# Patient Record
Sex: Female | Born: 1940 | Race: White | Hispanic: No | Marital: Married | Smoking: Never smoker
Health system: Southern US, Community
[De-identification: ages and names within clinical notes are randomized; demographics above are authoritative.]

## PROBLEM LIST (undated history)

## (undated) DIAGNOSIS — E559 Vitamin D deficiency, unspecified: Secondary | ICD-10-CM

## (undated) DIAGNOSIS — C801 Malignant (primary) neoplasm, unspecified: Secondary | ICD-10-CM

## (undated) DIAGNOSIS — G709 Myoneural disorder, unspecified: Secondary | ICD-10-CM

## (undated) DIAGNOSIS — E7800 Pure hypercholesterolemia, unspecified: Secondary | ICD-10-CM

## (undated) DIAGNOSIS — K219 Gastro-esophageal reflux disease without esophagitis: Secondary | ICD-10-CM

## (undated) DIAGNOSIS — S42202A Unspecified fracture of upper end of left humerus, initial encounter for closed fracture: Secondary | ICD-10-CM

## (undated) DIAGNOSIS — E782 Mixed hyperlipidemia: Secondary | ICD-10-CM

## (undated) DIAGNOSIS — I10 Essential (primary) hypertension: Secondary | ICD-10-CM

## (undated) DIAGNOSIS — E118 Type 2 diabetes mellitus with unspecified complications: Secondary | ICD-10-CM

## (undated) DIAGNOSIS — M1990 Unspecified osteoarthritis, unspecified site: Secondary | ICD-10-CM

## (undated) DIAGNOSIS — J302 Other seasonal allergic rhinitis: Secondary | ICD-10-CM

## (undated) HISTORY — DX: Unspecified fracture of upper end of left humerus, initial encounter for closed fracture: S42.202A

## (undated) HISTORY — DX: Type 2 diabetes mellitus with unspecified complications: E11.8

## (undated) HISTORY — PX: COLONOSCOPY: SHX174

## (undated) HISTORY — PX: BACK SURGERY: SHX140

## (undated) HISTORY — PX: BREAST SURGERY: SHX581

## (undated) HISTORY — PX: ELBOW SURGERY: SHX618

## (undated) HISTORY — PX: LYMPH NODE BIOPSY: SHX201

## (undated) HISTORY — DX: Mixed hyperlipidemia: E78.2

## (undated) HISTORY — PX: OTHER SURGICAL HISTORY: SHX169

## (undated) HISTORY — PX: FRACTURE SURGERY: SHX138

## (undated) HISTORY — DX: Vitamin D deficiency, unspecified: E55.9

---

## 1974-03-27 HISTORY — PX: DILATION AND CURETTAGE OF UTERUS: SHX78

## 1989-03-27 HISTORY — PX: LUMBAR LAMINECTOMY: SHX95

## 1999-01-14 ENCOUNTER — Ambulatory Visit (HOSPITAL_COMMUNITY): Admission: RE | Admit: 1999-01-14 | Discharge: 1999-01-15 | Payer: Self-pay | Admitting: Surgery

## 1999-01-14 ENCOUNTER — Encounter: Payer: Self-pay | Admitting: Surgery

## 1999-02-02 ENCOUNTER — Encounter: Admission: RE | Admit: 1999-02-02 | Discharge: 1999-05-03 | Payer: Self-pay | Admitting: *Deleted

## 1999-02-16 ENCOUNTER — Ambulatory Visit (HOSPITAL_COMMUNITY): Admission: RE | Admit: 1999-02-16 | Discharge: 1999-02-16 | Payer: Self-pay | Admitting: *Deleted

## 1999-02-16 ENCOUNTER — Encounter: Payer: Self-pay | Admitting: *Deleted

## 1999-02-24 ENCOUNTER — Encounter: Payer: Self-pay | Admitting: *Deleted

## 1999-02-24 ENCOUNTER — Ambulatory Visit (HOSPITAL_COMMUNITY): Admission: RE | Admit: 1999-02-24 | Discharge: 1999-02-24 | Payer: Self-pay | Admitting: *Deleted

## 1999-06-07 ENCOUNTER — Encounter: Admission: RE | Admit: 1999-06-07 | Discharge: 1999-09-05 | Payer: Self-pay | Admitting: *Deleted

## 2000-08-07 ENCOUNTER — Encounter: Payer: Self-pay | Admitting: *Deleted

## 2000-08-07 ENCOUNTER — Encounter: Admission: RE | Admit: 2000-08-07 | Discharge: 2000-08-07 | Payer: Self-pay | Admitting: *Deleted

## 2003-09-21 ENCOUNTER — Emergency Department (HOSPITAL_COMMUNITY): Admission: EM | Admit: 2003-09-21 | Discharge: 2003-09-21 | Payer: Self-pay | Admitting: Family Medicine

## 2004-09-05 ENCOUNTER — Ambulatory Visit: Payer: Self-pay | Admitting: Oncology

## 2005-09-01 ENCOUNTER — Ambulatory Visit: Payer: Self-pay | Admitting: Oncology

## 2005-09-06 LAB — CBC WITH DIFFERENTIAL/PLATELET
BASO%: 1.2 % (ref 0.0–2.0)
Basophils Absolute: 0.1 10*3/uL (ref 0.0–0.1)
EOS%: 2.7 % (ref 0.0–7.0)
Eosinophils Absolute: 0.2 10*3/uL (ref 0.0–0.5)
HCT: 39.6 % (ref 34.8–46.6)
HGB: 13.6 g/dL (ref 11.6–15.9)
LYMPH%: 31 % (ref 14.0–48.0)
MCH: 29.5 pg (ref 26.0–34.0)
MCHC: 34.3 g/dL (ref 32.0–36.0)
MCV: 86.1 fL (ref 81.0–101.0)
MONO#: 0.4 10*3/uL (ref 0.1–0.9)
MONO%: 6.6 % (ref 0.0–13.0)
NEUT#: 4 10*3/uL (ref 1.5–6.5)
NEUT%: 58.6 % (ref 39.6–76.8)
Platelets: 140 10*3/uL — ABNORMAL LOW (ref 145–400)
RBC: 4.59 10*6/uL (ref 3.70–5.32)
RDW: 11.9 % (ref 11.3–14.5)
WBC: 6.8 10*3/uL (ref 3.9–10.0)
lymph#: 2.1 10*3/uL (ref 0.9–3.3)

## 2005-09-06 LAB — COMPREHENSIVE METABOLIC PANEL
ALT: 16 U/L (ref 0–40)
AST: 26 U/L (ref 0–37)
Albumin: 4.2 g/dL (ref 3.5–5.2)
Alkaline Phosphatase: 64 U/L (ref 39–117)
BUN: 11 mg/dL (ref 6–23)
CO2: 25 mEq/L (ref 19–32)
Calcium: 8.7 mg/dL (ref 8.4–10.5)
Chloride: 104 mEq/L (ref 96–112)
Creatinine, Ser: 0.69 mg/dL (ref 0.40–1.20)
Glucose, Bld: 115 mg/dL — ABNORMAL HIGH (ref 70–99)
Potassium: 3.8 mEq/L (ref 3.5–5.3)
Sodium: 137 mEq/L (ref 135–145)
Total Bilirubin: 0.4 mg/dL (ref 0.3–1.2)
Total Protein: 7 g/dL (ref 6.0–8.3)

## 2005-09-06 LAB — LACTATE DEHYDROGENASE: LDH: 241 U/L (ref 94–250)

## 2005-10-04 LAB — COMPREHENSIVE METABOLIC PANEL
ALT: 15 U/L (ref 0–40)
AST: 16 U/L (ref 0–37)
Albumin: 4.5 g/dL (ref 3.5–5.2)
Alkaline Phosphatase: 70 U/L (ref 39–117)
BUN: 11 mg/dL (ref 6–23)
CO2: 25 mEq/L (ref 19–32)
Calcium: 9 mg/dL (ref 8.4–10.5)
Chloride: 101 mEq/L (ref 96–112)
Creatinine, Ser: 0.61 mg/dL (ref 0.40–1.20)
Glucose, Bld: 85 mg/dL (ref 70–99)
Potassium: 4.1 mEq/L (ref 3.5–5.3)
Sodium: 138 mEq/L (ref 135–145)
Total Bilirubin: 0.3 mg/dL (ref 0.3–1.2)
Total Protein: 7.1 g/dL (ref 6.0–8.3)

## 2005-10-04 LAB — CBC WITH DIFFERENTIAL/PLATELET
BASO%: 0.6 % (ref 0.0–2.0)
Basophils Absolute: 0 10*3/uL (ref 0.0–0.1)
EOS%: 2.1 % (ref 0.0–7.0)
Eosinophils Absolute: 0.1 10*3/uL (ref 0.0–0.5)
HCT: 39.9 % (ref 34.8–46.6)
HGB: 13.4 g/dL (ref 11.6–15.9)
LYMPH%: 34.6 % (ref 14.0–48.0)
MCH: 29 pg (ref 26.0–34.0)
MCHC: 33.7 g/dL (ref 32.0–36.0)
MCV: 86 fL (ref 81.0–101.0)
MONO#: 0.4 10*3/uL (ref 0.1–0.9)
MONO%: 6.7 % (ref 0.0–13.0)
NEUT#: 3.3 10*3/uL (ref 1.5–6.5)
NEUT%: 56 % (ref 39.6–76.8)
Platelets: 236 10*3/uL (ref 145–400)
RBC: 4.63 10*6/uL (ref 3.70–5.32)
RDW: 13.7 % (ref 11.3–14.5)
WBC: 5.9 10*3/uL (ref 3.9–10.0)
lymph#: 2 10*3/uL (ref 0.9–3.3)

## 2005-10-04 LAB — LACTATE DEHYDROGENASE: LDH: 147 U/L (ref 94–250)

## 2006-08-27 ENCOUNTER — Ambulatory Visit: Payer: Self-pay | Admitting: Oncology

## 2006-08-29 LAB — CBC WITH DIFFERENTIAL/PLATELET
BASO%: 0.4 % (ref 0.0–2.0)
Basophils Absolute: 0 10*3/uL (ref 0.0–0.1)
EOS%: 2.5 % (ref 0.0–7.0)
Eosinophils Absolute: 0.2 10*3/uL (ref 0.0–0.5)
HCT: 36.8 % (ref 34.8–46.6)
HGB: 12.6 g/dL (ref 11.6–15.9)
LYMPH%: 27.1 % (ref 14.0–48.0)
MCH: 29.4 pg (ref 26.0–34.0)
MCHC: 34.3 g/dL (ref 32.0–36.0)
MCV: 85.7 fL (ref 81.0–101.0)
MONO#: 0.5 10*3/uL (ref 0.1–0.9)
MONO%: 6.1 % (ref 0.0–13.0)
NEUT#: 4.8 10*3/uL (ref 1.5–6.5)
NEUT%: 63.9 % (ref 39.6–76.8)
Platelets: 235 10*3/uL (ref 145–400)
RBC: 4.3 10*6/uL (ref 3.70–5.32)
RDW: 13.6 % (ref 11.3–14.5)
WBC: 7.5 10*3/uL (ref 3.9–10.0)
lymph#: 2 10*3/uL (ref 0.9–3.3)

## 2006-08-29 LAB — COMPREHENSIVE METABOLIC PANEL
ALT: 13 U/L (ref 0–35)
AST: 15 U/L (ref 0–37)
Albumin: 4.1 g/dL (ref 3.5–5.2)
Alkaline Phosphatase: 66 U/L (ref 39–117)
BUN: 12 mg/dL (ref 6–23)
CO2: 27 mEq/L (ref 19–32)
Calcium: 8.9 mg/dL (ref 8.4–10.5)
Chloride: 106 mEq/L (ref 96–112)
Creatinine, Ser: 0.69 mg/dL (ref 0.40–1.20)
Glucose, Bld: 128 mg/dL — ABNORMAL HIGH (ref 70–99)
Potassium: 3.2 mEq/L — ABNORMAL LOW (ref 3.5–5.3)
Sodium: 142 mEq/L (ref 135–145)
Total Bilirubin: 0.3 mg/dL (ref 0.3–1.2)
Total Protein: 6.8 g/dL (ref 6.0–8.3)

## 2006-08-29 LAB — LACTATE DEHYDROGENASE: LDH: 149 U/L (ref 94–250)

## 2007-09-26 ENCOUNTER — Ambulatory Visit: Payer: Self-pay | Admitting: Oncology

## 2007-10-01 LAB — CBC WITH DIFFERENTIAL/PLATELET
BASO%: 0.5 % (ref 0.0–2.0)
Basophils Absolute: 0 10*3/uL (ref 0.0–0.1)
EOS%: 3.4 % (ref 0.0–7.0)
Eosinophils Absolute: 0.2 10*3/uL (ref 0.0–0.5)
HCT: 40.1 % (ref 34.8–46.6)
HGB: 13.5 g/dL (ref 11.6–15.9)
LYMPH%: 30.6 % (ref 14.0–48.0)
MCH: 28.5 pg (ref 26.0–34.0)
MCHC: 33.6 g/dL (ref 32.0–36.0)
MCV: 84.9 fL (ref 81.0–101.0)
MONO#: 0.3 10*3/uL (ref 0.1–0.9)
MONO%: 5.9 % (ref 0.0–13.0)
NEUT#: 3.4 10*3/uL (ref 1.5–6.5)
NEUT%: 59.6 % (ref 39.6–76.8)
Platelets: 245 10*3/uL (ref 145–400)
RBC: 4.72 10*6/uL (ref 3.70–5.32)
RDW: 13.2 % (ref 11.3–14.5)
WBC: 5.7 10*3/uL (ref 3.9–10.0)
lymph#: 1.8 10*3/uL (ref 0.9–3.3)

## 2007-10-01 LAB — COMPREHENSIVE METABOLIC PANEL
ALT: 17 U/L (ref 0–35)
AST: 16 U/L (ref 0–37)
Albumin: 4.3 g/dL (ref 3.5–5.2)
Alkaline Phosphatase: 60 U/L (ref 39–117)
BUN: 13 mg/dL (ref 6–23)
CO2: 23 mEq/L (ref 19–32)
Calcium: 9 mg/dL (ref 8.4–10.5)
Chloride: 103 mEq/L (ref 96–112)
Creatinine, Ser: 0.7 mg/dL (ref 0.40–1.20)
Glucose, Bld: 103 mg/dL — ABNORMAL HIGH (ref 70–99)
Potassium: 3.8 mEq/L (ref 3.5–5.3)
Sodium: 138 mEq/L (ref 135–145)
Total Bilirubin: 0.4 mg/dL (ref 0.3–1.2)
Total Protein: 7.4 g/dL (ref 6.0–8.3)

## 2007-10-01 LAB — LACTATE DEHYDROGENASE: LDH: 157 U/L (ref 94–250)

## 2008-09-23 ENCOUNTER — Ambulatory Visit: Payer: Self-pay | Admitting: Oncology

## 2008-09-29 LAB — CBC WITH DIFFERENTIAL/PLATELET
BASO%: 0.6 % (ref 0.0–2.0)
Basophils Absolute: 0 10*3/uL (ref 0.0–0.1)
EOS%: 2.7 % (ref 0.0–7.0)
Eosinophils Absolute: 0.2 10*3/uL (ref 0.0–0.5)
HCT: 37.5 % (ref 34.8–46.6)
HGB: 13 g/dL (ref 11.6–15.9)
LYMPH%: 34.6 % (ref 14.0–49.7)
MCH: 29.7 pg (ref 25.1–34.0)
MCHC: 34.7 g/dL (ref 31.5–36.0)
MCV: 85.7 fL (ref 79.5–101.0)
MONO#: 0.4 10*3/uL (ref 0.1–0.9)
MONO%: 6.8 % (ref 0.0–14.0)
NEUT#: 3.6 10*3/uL (ref 1.5–6.5)
NEUT%: 55.3 % (ref 38.4–76.8)
Platelets: 217 10*3/uL (ref 145–400)
RBC: 4.37 10*6/uL (ref 3.70–5.45)
RDW: 13.8 % (ref 11.2–14.5)
WBC: 6.5 10*3/uL (ref 3.9–10.3)
lymph#: 2.2 10*3/uL (ref 0.9–3.3)

## 2008-09-29 LAB — COMPREHENSIVE METABOLIC PANEL
ALT: 21 U/L (ref 0–35)
AST: 24 U/L (ref 0–37)
Albumin: 4.2 g/dL (ref 3.5–5.2)
Alkaline Phosphatase: 73 U/L (ref 39–117)
BUN: 10 mg/dL (ref 6–23)
CO2: 24 mEq/L (ref 19–32)
Calcium: 9.1 mg/dL (ref 8.4–10.5)
Chloride: 103 mEq/L (ref 96–112)
Creatinine, Ser: 0.64 mg/dL (ref 0.40–1.20)
Glucose, Bld: 118 mg/dL — ABNORMAL HIGH (ref 70–99)
Potassium: 3.4 mEq/L — ABNORMAL LOW (ref 3.5–5.3)
Sodium: 140 mEq/L (ref 135–145)
Total Bilirubin: 0.2 mg/dL — ABNORMAL LOW (ref 0.3–1.2)
Total Protein: 7 g/dL (ref 6.0–8.3)

## 2008-09-29 LAB — LACTATE DEHYDROGENASE: LDH: 165 U/L (ref 94–250)

## 2008-11-20 LAB — HM PAP SMEAR

## 2008-11-20 LAB — HM MAMMOGRAPHY: HM Mammogram: NORMAL

## 2009-04-27 ENCOUNTER — Encounter: Admission: RE | Admit: 2009-04-27 | Discharge: 2009-04-27 | Payer: Self-pay | Admitting: Internal Medicine

## 2009-09-23 ENCOUNTER — Ambulatory Visit: Payer: Self-pay | Admitting: Oncology

## 2009-09-28 LAB — COMPREHENSIVE METABOLIC PANEL
ALT: 17 U/L (ref 0–35)
AST: 23 U/L (ref 0–37)
Albumin: 4.3 g/dL (ref 3.5–5.2)
Alkaline Phosphatase: 50 U/L (ref 39–117)
BUN: 11 mg/dL (ref 6–23)
CO2: 23 mEq/L (ref 19–32)
Calcium: 9.5 mg/dL (ref 8.4–10.5)
Chloride: 101 mEq/L (ref 96–112)
Creatinine, Ser: 0.6 mg/dL (ref 0.40–1.20)
Glucose, Bld: 93 mg/dL (ref 70–99)
Potassium: 3.6 mEq/L (ref 3.5–5.3)
Sodium: 136 mEq/L (ref 135–145)
Total Bilirubin: 0.3 mg/dL (ref 0.3–1.2)
Total Protein: 6.9 g/dL (ref 6.0–8.3)

## 2009-09-28 LAB — CBC WITH DIFFERENTIAL/PLATELET
BASO%: 0.3 % (ref 0.0–2.0)
Basophils Absolute: 0 10*3/uL (ref 0.0–0.1)
EOS%: 2.7 % (ref 0.0–7.0)
Eosinophils Absolute: 0.2 10*3/uL (ref 0.0–0.5)
HCT: 37.8 % (ref 34.8–46.6)
HGB: 12.5 g/dL (ref 11.6–15.9)
LYMPH%: 37.1 % (ref 14.0–49.7)
MCH: 28.7 pg (ref 25.1–34.0)
MCHC: 33.1 g/dL (ref 31.5–36.0)
MCV: 86.7 fL (ref 79.5–101.0)
MONO#: 0.5 10*3/uL (ref 0.1–0.9)
MONO%: 7.2 % (ref 0.0–14.0)
NEUT#: 3.5 10*3/uL (ref 1.5–6.5)
NEUT%: 52.7 % (ref 38.4–76.8)
Platelets: 218 10*3/uL (ref 145–400)
RBC: 4.36 10*6/uL (ref 3.70–5.45)
RDW: 13.4 % (ref 11.2–14.5)
WBC: 6.6 10*3/uL (ref 3.9–10.3)
lymph#: 2.4 10*3/uL (ref 0.9–3.3)

## 2009-09-28 LAB — LACTATE DEHYDROGENASE: LDH: 168 U/L (ref 94–250)

## 2010-12-26 HISTORY — PX: KNEE SURGERY: SHX244

## 2011-01-23 ENCOUNTER — Emergency Department (HOSPITAL_BASED_OUTPATIENT_CLINIC_OR_DEPARTMENT_OTHER)
Admission: EM | Admit: 2011-01-23 | Discharge: 2011-01-24 | Disposition: A | Discharge status: Home / Self Care | Payer: Medicare Other | Attending: Emergency Medicine | Admitting: Emergency Medicine

## 2011-01-23 ENCOUNTER — Emergency Department (INDEPENDENT_AMBULATORY_CARE_PROVIDER_SITE_OTHER): Payer: Medicare Other

## 2011-01-23 ENCOUNTER — Encounter: Payer: Self-pay | Admitting: *Deleted

## 2011-01-23 DIAGNOSIS — M5030 Other cervical disc degeneration, unspecified cervical region: Secondary | ICD-10-CM | POA: Insufficient documentation

## 2011-01-23 DIAGNOSIS — S52123A Displaced fracture of head of unspecified radius, initial encounter for closed fracture: Secondary | ICD-10-CM

## 2011-01-23 DIAGNOSIS — S0003XA Contusion of scalp, initial encounter: Secondary | ICD-10-CM | POA: Insufficient documentation

## 2011-01-23 DIAGNOSIS — Z043 Encounter for examination and observation following other accident: Secondary | ICD-10-CM

## 2011-01-23 DIAGNOSIS — Y92009 Unspecified place in unspecified non-institutional (private) residence as the place of occurrence of the external cause: Secondary | ICD-10-CM | POA: Insufficient documentation

## 2011-01-23 DIAGNOSIS — W19XXXA Unspecified fall, initial encounter: Secondary | ICD-10-CM | POA: Insufficient documentation

## 2011-01-23 DIAGNOSIS — E7800 Pure hypercholesterolemia, unspecified: Secondary | ICD-10-CM | POA: Insufficient documentation

## 2011-01-23 DIAGNOSIS — Z79899 Other long term (current) drug therapy: Secondary | ICD-10-CM | POA: Insufficient documentation

## 2011-01-23 DIAGNOSIS — Z8739 Personal history of other diseases of the musculoskeletal system and connective tissue: Secondary | ICD-10-CM | POA: Insufficient documentation

## 2011-01-23 DIAGNOSIS — M47812 Spondylosis without myelopathy or radiculopathy, cervical region: Secondary | ICD-10-CM

## 2011-01-23 DIAGNOSIS — S1093XA Contusion of unspecified part of neck, initial encounter: Secondary | ICD-10-CM | POA: Insufficient documentation

## 2011-01-23 DIAGNOSIS — S0990XA Unspecified injury of head, initial encounter: Secondary | ICD-10-CM

## 2011-01-23 HISTORY — DX: Pure hypercholesterolemia, unspecified: E78.00

## 2011-01-23 HISTORY — DX: Unspecified osteoarthritis, unspecified site: M19.90

## 2011-01-23 HISTORY — DX: Malignant (primary) neoplasm, unspecified: C80.1

## 2011-01-23 MED ORDER — HYDROCODONE-ACETAMINOPHEN 5-325 MG PO TABS
1.0000 | ORAL_TABLET | Freq: Once | ORAL | Status: AC
  Administered 2011-01-23: 1 via ORAL
  Filled 2011-01-23: qty 1

## 2011-01-23 MED ORDER — TETANUS-DIPHTH-ACELL PERTUSSIS 5-2.5-18.5 LF-MCG/0.5 IM SUSP
0.5000 mL | Freq: Once | INTRAMUSCULAR | Status: AC
  Administered 2011-01-23: 0.5 mL via INTRAMUSCULAR
  Filled 2011-01-23: qty 0.5

## 2011-01-23 NOTE — ED Provider Notes (Signed)
History    Scribed for Shelda Jakes, MD, the patient was seen in room MH05/MH05. This chart was scribed by Katha Cabal.   CSN: [id number] Arrival date & time: 01/23/2011  9:38 PM   First MD Initiated Contact with Patient 01/23/11 2156      Chief Complaint  Patient presents with  . Arm Injury  . Fall    (Consider location/radiation/quality/duration/timing/severity/associated sxs/prior treatment) HPI  History is provided by the patient and supplemented by patient's daughter.   Jennifer Wu is a 70 y.o. female brought in by ambulance, who presents to the Emergency Department complaining of sudden moderate fall with associated persistent constant left arm pain, head injury and facial abrasions. Denies chest pain, back pain, abdominal pain, neck pain, nausea, vomiting, and difficulty breathing. Pain is aggravated by movement.  Patient was knocked over by a gust of wind while trying to get leaves out of her pool.  Patient fell onto concrete landing injuring left arm and hitting head.  There was no LOC.  Patient felt and heard a pop in left forearm.  Patient had right meniscus repaired recently.  Patient used ice pack and EMS applied arm immobilizer prior to arrival. Tetanus is not UTD.    Past Medical History  Diagnosis Date  . High cholesterol   . Cancer   . Arthritis     Past Surgical History  Procedure Date  . Knee surgery   . Breast surgery   . Lymph node biopsy     No family history on file.  History  Substance Use Topics  . Smoking status: Never Smoker   . Smokeless tobacco: Not on file  . Alcohol Use: No    OB History    Grav Para Term Preterm Abortions TAB SAB Ect Mult Living                  Review of Systems 10 Systems reviewed and are negative for acute change except as noted in the HPI.  Allergies  Review of patient's allergies indicates no known allergies.  Home Medications   Current Outpatient Rx  Name Route Sig Dispense Refill  .  OMEPRAZOLE MAGNESIUM 20 MG PO TBEC Oral Take 20 mg by mouth daily.        BP 194/80  Pulse 89  Temp(Src) 98 F (36.7 C) (Oral)  Resp 20  Ht 5\' 6"  (1.676 m)  Wt 188 lb (85.276 kg)  BMI 30.34 kg/m2  SpO2 95%  Physical Exam  Nursing note and vitals reviewed. Constitutional: She is oriented to person, place, and time. She appears well-developed and well-nourished. No distress.  HENT:  Head: Normocephalic.       forehead hematoma medline, 2 cm abrasion of nose and superficial abrasion to chin   Eyes: EOM are normal. Pupils are equal, round, and reactive to light.  Neck: Normal range of motion. Neck supple. No tracheal deviation present.  Cardiovascular: Normal rate, regular rhythm and normal heart sounds.   No murmur heard. Pulmonary/Chest: Effort normal and breath sounds normal. No respiratory distress.  Abdominal: Soft. There is no tenderness.  Musculoskeletal: Normal range of motion. She exhibits tenderness. She exhibits no edema.       No UE deformity, Brisk cap refill, no shoulder or wrist tenderness, good radial pulse, proximal forearm tenderness, No LE edema or deformity,   Neurological: She is alert and oriented to person, place, and time. No sensory deficit.  Skin: Skin is warm and dry. No rash noted.  Psychiatric: She has a normal mood and affect. Her behavior is normal.    ED Course  Procedures (including critical care time)   DIAGNOSTIC STUDIES: Oxygen Saturation is 95% on room air, adequate by my interpretation.    COORDINATION OF CARE:  10:38 PM  Physical exam complete.  Will order Head, Maxillofacial, C spine CT and XR left forearm and elbow.   11:22 PM  Patient requests medication for pain.  Will order pain control.   11:36 PM  Reviewed radiological findings.  Fracture of the radial head.  Will order sling immobilizer and apply splint.  Plan to discharge patient.  Patient agrees with plan.      LABS / RADIOLOGY:   Labs Reviewed - No data to display  Dg  Elbow Complete Left  01/23/2011  *RADIOLOGY REPORT*  Clinical Data: Fall  LEFT ELBOW - COMPLETE 3+ VIEW  Comparison: None.  Findings: Fracture of the radial head with displacement of the fracture fragment.  Fracture extends into the elbow joint.  No other fracture is identified.  IMPRESSION: Displaced fracture of the radial head  Original Report Authenticated By: Camelia Phenes, M.D.   Dg Forearm Left  01/23/2011  *RADIOLOGY REPORT*  Clinical Data: Fall  LEFT FOREARM - 2 VIEW  Comparison: None.  Findings: Fracture of the radial head which is better evaluated on a separate elbow study.  No other fracture.  IMPRESSION: Fracture of the radial head.  Original Report Authenticated By: Camelia Phenes, M.D.   Ct Head Wo Contrast  01/23/2011  *RADIOLOGY REPORT*  Clinical Data:  Fall.  Head injury.  CT HEAD WITHOUT CONTRAST CT MAXILLOFACIAL WITHOUT CONTRAST CT CERVICAL SPINE WITHOUT CONTRAST  Technique:  Multidetector CT imaging of the head, cervical spine, and maxillofacial structures were performed using the standard protocol without intravenous contrast. Multiplanar CT image reconstructions of the cervical spine and maxillofacial structures were also generated.  Comparison:   none  CT HEAD  Findings: Ventricle size is normal.  Negative for hemorrhage. Negative for acute infarct or mass.  Negative for skull fracture.  IMPRESSION: Negative for acute abnormality.  CT MAXILLOFACIAL  Findings:  Negative for facial fracture.  Nasal bone is intact. Orbit is intact.  Negative for fracture of the mandible.  Mild chronic sinusitis.  Small frontal scalp hematoma to the left of midline just above the orbit.  No fluid in the sinuses.  IMPRESSION: Negative for facial fracture.  CT CERVICAL SPINE  Findings:   Negative for fracture.  Mild kyphosis of the cervical spine.  Mild anterior slip C3 on C4.  Disc degeneration and spondylosis C4-5, C5-6, C6-7.  Well circumscribed low density lesion in the C7 vertebral body anteriorly.   This appears benign and may represent hemangioma.  IMPRESSION: Negative for fracture.  Original Report Authenticated By: Camelia Phenes, M.D.   Ct Cervical Spine Wo Contrast  01/23/2011  *RADIOLOGY REPORT*  Clinical Data:  Fall.  Head injury.  CT HEAD WITHOUT CONTRAST CT MAXILLOFACIAL WITHOUT CONTRAST CT CERVICAL SPINE WITHOUT CONTRAST  Technique:  Multidetector CT imaging of the head, cervical spine, and maxillofacial structures were performed using the standard protocol without intravenous contrast. Multiplanar CT image reconstructions of the cervical spine and maxillofacial structures were also generated.  Comparison:   none  CT HEAD  Findings: Ventricle size is normal.  Negative for hemorrhage. Negative for acute infarct or mass.  Negative for skull fracture.  IMPRESSION: Negative for acute abnormality.  CT MAXILLOFACIAL  Findings:  Negative for facial fracture.  Nasal bone is intact. Orbit is intact.  Negative for fracture of the mandible.  Mild chronic sinusitis.  Small frontal scalp hematoma to the left of midline just above the orbit.  No fluid in the sinuses.  IMPRESSION: Negative for facial fracture.  CT CERVICAL SPINE  Findings:   Negative for fracture.  Mild kyphosis of the cervical spine.  Mild anterior slip C3 on C4.  Disc degeneration and spondylosis C4-5, C5-6, C6-7.  Well circumscribed low density lesion in the C7 vertebral body anteriorly.  This appears benign and may represent hemangioma.  IMPRESSION: Negative for fracture.  Original Report Authenticated By: Camelia Phenes, M.D.   Dg Hand Complete Left  01/23/2011  *RADIOLOGY REPORT*  Clinical Data: Fall  LEFT HAND - COMPLETE 3+ VIEW  Comparison: None.  Findings: Negative for fracture.  No significant arthropathy or bony lesion.  IMPRESSION: Negative  Original Report Authenticated By: Camelia Phenes, M.D.   Ct Maxillofacial Wo Cm  01/23/2011  *RADIOLOGY REPORT*  Clinical Data:  Fall.  Head injury.  CT HEAD WITHOUT CONTRAST CT  MAXILLOFACIAL WITHOUT CONTRAST CT CERVICAL SPINE WITHOUT CONTRAST  Technique:  Multidetector CT imaging of the head, cervical spine, and maxillofacial structures were performed using the standard protocol without intravenous contrast. Multiplanar CT image reconstructions of the cervical spine and maxillofacial structures were also generated.  Comparison:   none  CT HEAD  Findings: Ventricle size is normal.  Negative for hemorrhage. Negative for acute infarct or mass.  Negative for skull fracture.  IMPRESSION: Negative for acute abnormality.  CT MAXILLOFACIAL  Findings:  Negative for facial fracture.  Nasal bone is intact. Orbit is intact.  Negative for fracture of the mandible.  Mild chronic sinusitis.  Small frontal scalp hematoma to the left of midline just above the orbit.  No fluid in the sinuses.  IMPRESSION: Negative for facial fracture.  CT CERVICAL SPINE  Findings:   Negative for fracture.  Mild kyphosis of the cervical spine.  Mild anterior slip C3 on C4.  Disc degeneration and spondylosis C4-5, C5-6, C6-7.  Well circumscribed low density lesion in the C7 vertebral body anteriorly.  This appears benign and may represent hemangioma.  IMPRESSION: Negative for fracture.  Original Report Authenticated By: Camelia Phenes, M.D.         MDM   MDM:   Status post fall with head injury and left arm injury. CT and radiologic studies all negative except for left displaced radial head fracture. We'll treat with a sugar tong splint and referral to orthopedics. Patient is already followed by [facility].     MEDICATIONS GIVEN IN THE E.D. Scheduled Meds:    . HYDROcodone-acetaminophen  1 tablet Oral Once  . TDaP  0.5 mL Intramuscular Once   Continuous Infusions:      IMPRESSION: Radial head fracture    I personally performed the services described in this documentation, which was scribed in my presence. The recorded information has been reviewed and considered.               Shelda Jakes, MD 01/24/11 [phone]

## 2011-01-23 NOTE — ED Notes (Signed)
Pt has splint and ice applied pta

## 2011-01-23 NOTE — ED Notes (Signed)
Pt states wind "blew her down" while cleaning leaves out of pool- hit head with abrasion to nose and left arm pain- recent surgery on right knee-

## 2011-01-24 MED ORDER — HYDROCODONE-ACETAMINOPHEN 5-325 MG PO TABS: 1.0000 | ORAL_TABLET | Freq: Four times a day (QID) | ORAL | Status: AC | PRN

## 2011-01-26 HISTORY — PX: ELBOW SURGERY: SHX618

## 2011-01-31 ENCOUNTER — Other Ambulatory Visit: Payer: Self-pay

## 2011-01-31 ENCOUNTER — Inpatient Hospital Stay (HOSPITAL_BASED_OUTPATIENT_CLINIC_OR_DEPARTMENT_OTHER)
Admission: RE | Admit: 2011-01-31 | Discharge: 2011-01-31 | Disposition: A | Discharge status: Home / Self Care | Payer: BC Managed Care – PPO | Source: Ambulatory Visit

## 2011-01-31 ENCOUNTER — Encounter (HOSPITAL_BASED_OUTPATIENT_CLINIC_OR_DEPARTMENT_OTHER): Payer: Self-pay | Admitting: *Deleted

## 2011-01-31 NOTE — H&P (Signed)
  See dictated note [id number] 

## 2011-02-01 ENCOUNTER — Encounter (HOSPITAL_BASED_OUTPATIENT_CLINIC_OR_DEPARTMENT_OTHER): Payer: Self-pay | Admitting: Anesthesiology

## 2011-02-01 ENCOUNTER — Ambulatory Visit (HOSPITAL_BASED_OUTPATIENT_CLINIC_OR_DEPARTMENT_OTHER): Payer: Medicare Other | Admitting: Anesthesiology

## 2011-02-01 ENCOUNTER — Encounter (HOSPITAL_BASED_OUTPATIENT_CLINIC_OR_DEPARTMENT_OTHER): Payer: Self-pay | Admitting: *Deleted

## 2011-02-01 ENCOUNTER — Other Ambulatory Visit: Payer: Self-pay | Admitting: Orthopedic Surgery

## 2011-02-01 ENCOUNTER — Encounter (HOSPITAL_BASED_OUTPATIENT_CLINIC_OR_DEPARTMENT_OTHER)
Admission: RE | Disposition: A | Discharge status: Home / Self Care | Payer: Self-pay | Source: Ambulatory Visit | Attending: Orthopedic Surgery

## 2011-02-01 ENCOUNTER — Ambulatory Visit (HOSPITAL_BASED_OUTPATIENT_CLINIC_OR_DEPARTMENT_OTHER)
Admission: RE | Admit: 2011-02-01 | Discharge: 2011-02-01 | Disposition: A | Discharge status: Home / Self Care | Payer: Medicare Other | Source: Ambulatory Visit | Attending: Orthopedic Surgery | Admitting: Orthopedic Surgery

## 2011-02-01 DIAGNOSIS — S52123A Displaced fracture of head of unspecified radius, initial encounter for closed fracture: Secondary | ICD-10-CM | POA: Insufficient documentation

## 2011-02-01 DIAGNOSIS — Z01812 Encounter for preprocedural laboratory examination: Secondary | ICD-10-CM | POA: Insufficient documentation

## 2011-02-01 DIAGNOSIS — Z01810 Encounter for preprocedural cardiovascular examination: Secondary | ICD-10-CM | POA: Insufficient documentation

## 2011-02-01 DIAGNOSIS — G709 Myoneural disorder, unspecified: Secondary | ICD-10-CM | POA: Insufficient documentation

## 2011-02-01 DIAGNOSIS — X58XXXA Exposure to other specified factors, initial encounter: Secondary | ICD-10-CM | POA: Insufficient documentation

## 2011-02-01 DIAGNOSIS — E669 Obesity, unspecified: Secondary | ICD-10-CM | POA: Insufficient documentation

## 2011-02-01 DIAGNOSIS — S5290XA Unspecified fracture of unspecified forearm, initial encounter for closed fracture: Secondary | ICD-10-CM

## 2011-02-01 HISTORY — DX: Myoneural disorder, unspecified: G70.9

## 2011-02-01 HISTORY — PX: RADIAL HEAD IMPLANT: SHX758

## 2011-02-01 LAB — POCT HEMOGLOBIN-HEMACUE: Hemoglobin: 12.5 g/dL (ref 12.0–15.0)

## 2011-02-01 SURGERY — ARTHROPLASTY, RADIUS, HEAD
Anesthesia: General | Laterality: Left

## 2011-02-01 MED ORDER — TETRACAINE HCL 1 % IJ SOLN
INTRAMUSCULAR | Status: DC | PRN
  Administered 2011-02-01: 20 mg via INTRASPINAL

## 2011-02-01 MED ORDER — ONDANSETRON HCL 4 MG/2ML IJ SOLN
INTRAMUSCULAR | Status: DC | PRN
  Administered 2011-02-01: 4 mg via INTRAVENOUS

## 2011-02-01 MED ORDER — ACETAMINOPHEN 10 MG/ML IV SOLN
1000.0000 mg | Freq: Once | INTRAVENOUS | Status: AC
  Administered 2011-02-01: 1000 mg via INTRAVENOUS

## 2011-02-01 MED ORDER — HYDROMORPHONE HCL PF 1 MG/ML IJ SOLN: 0.2500 mg | INTRAMUSCULAR | Status: DC | PRN

## 2011-02-01 MED ORDER — EPHEDRINE SULFATE 50 MG/ML IJ SOLN
INTRAMUSCULAR | Status: DC | PRN
  Administered 2011-02-01: 10 mg via INTRAVENOUS
  Administered 2011-02-01: 5 mg via INTRAVENOUS

## 2011-02-01 MED ORDER — DEXAMETHASONE SODIUM PHOSPHATE 4 MG/ML IJ SOLN
INTRAMUSCULAR | Status: DC | PRN
  Administered 2011-02-01: 10 mg via INTRAVENOUS

## 2011-02-01 MED ORDER — CEFAZOLIN SODIUM 1-5 GM-% IV SOLN: 1.0000 g | INTRAVENOUS | Status: DC

## 2011-02-01 MED ORDER — MEPERIDINE HCL 25 MG/ML IJ SOLN: 6.2500 mg | INTRAMUSCULAR | Status: DC | PRN

## 2011-02-01 MED ORDER — PROPOFOL 10 MG/ML IV EMUL
INTRAVENOUS | Status: DC | PRN
  Administered 2011-02-01: 200 mg via INTRAVENOUS

## 2011-02-01 MED ORDER — PROMETHAZINE HCL 25 MG/ML IJ SOLN: 6.2500 mg | INTRAMUSCULAR | Status: DC | PRN

## 2011-02-01 MED ORDER — MIDAZOLAM HCL 2 MG/2ML IJ SOLN: 1.0000 mg | INTRAMUSCULAR | Status: DC | PRN

## 2011-02-01 MED ORDER — CEFAZOLIN SODIUM-DEXTROSE 2-3 GM-% IV SOLR
2.0000 g | INTRAVENOUS | Status: AC
  Administered 2011-02-01: 2 g via INTRAVENOUS

## 2011-02-01 MED ORDER — LACTATED RINGERS IV SOLN
INTRAVENOUS | Status: DC
  Administered 2011-02-01 (×2): via INTRAVENOUS

## 2011-02-01 MED ORDER — LIDOCAINE HCL (CARDIAC) 20 MG/ML IV SOLN
INTRAVENOUS | Status: DC | PRN
  Administered 2011-02-01: 75 mg via INTRAVENOUS

## 2011-02-01 MED ORDER — PHENYLEPHRINE HCL 10 MG/ML IJ SOLN
INTRAMUSCULAR | Status: DC | PRN
  Administered 2011-02-01: 40 ug via INTRAVENOUS
  Administered 2011-02-01: 60 ug via INTRAVENOUS
  Administered 2011-02-01: 40 ug via INTRAVENOUS

## 2011-02-01 MED ORDER — LIDOCAINE-EPINEPHRINE 1.5-1:200000 % IJ SOLN
INTRAMUSCULAR | Status: DC | PRN
  Administered 2011-02-01: 50 mg via INTRADERMAL

## 2011-02-01 MED ORDER — MIDAZOLAM HCL 2 MG/2ML IJ SOLN
1.0000 mg | INTRAMUSCULAR | Status: DC | PRN
  Administered 2011-02-01: 2 mg via INTRAVENOUS

## 2011-02-01 MED ORDER — FENTANYL CITRATE 0.05 MG/ML IJ SOLN
50.0000 ug | INTRAMUSCULAR | Status: DC | PRN
  Administered 2011-02-01: 100 ug via INTRAVENOUS

## 2011-02-01 SURGICAL SUPPLY — 74 items
BAG DECANTER FOR FLEXI CONT (MISCELLANEOUS) IMPLANT
BANDAGE ELASTIC 3 VELCRO ST LF (GAUZE/BANDAGES/DRESSINGS) ×2 IMPLANT
BANDAGE ELASTIC 4 VELCRO ST LF (GAUZE/BANDAGES/DRESSINGS) ×4 IMPLANT
BANDAGE GAUZE ELAST BULKY 4 IN (GAUZE/BANDAGES/DRESSINGS) IMPLANT
BLADE AVERAGE 25X9 (BLADE) ×2 IMPLANT
BLADE MINI RND TIP GREEN BEAV (BLADE) IMPLANT
BLADE SURG 15 STRL LF DISP TIS (BLADE) ×1 IMPLANT
BLADE SURG 15 STRL SS (BLADE) ×2
BNDG CMPR 9X4 STRL LF SNTH (GAUZE/BANDAGES/DRESSINGS) ×1
BNDG ESMARK 4X9 LF (GAUZE/BANDAGES/DRESSINGS) ×2 IMPLANT
CANISTER SUCTION 1200CC (MISCELLANEOUS) ×2 IMPLANT
CLOSURE STERI STRIP 1/2 X4 (GAUZE/BANDAGES/DRESSINGS) ×2 IMPLANT
CLOTH BEACON ORANGE TIMEOUT ST (SAFETY) ×2 IMPLANT
CORDS BIPOLAR (ELECTRODE) ×2 IMPLANT
COVER TABLE BACK 60X90 (DRAPES) ×2 IMPLANT
CUFF TOURNIQUET SINGLE 18IN (TOURNIQUET CUFF) ×2 IMPLANT
DECANTER SPIKE VIAL GLASS SM (MISCELLANEOUS) IMPLANT
DRAPE EXTREMITY T 121X128X90 (DRAPE) ×2 IMPLANT
DRAPE OEC MINIVIEW 54X84 (DRAPES) ×2 IMPLANT
DRAPE SURG 17X23 STRL (DRAPES) ×2 IMPLANT
DRSG KUZMA FLUFF (GAUZE/BANDAGES/DRESSINGS) IMPLANT
DURAPREP 26ML APPLICATOR (WOUND CARE) ×2 IMPLANT
ELECT REM PT RETURN 9FT ADLT (ELECTROSURGICAL)
ELECTRODE REM PT RTRN 9FT ADLT (ELECTROSURGICAL) IMPLANT
GAUZE XEROFORM 1X8 LF (GAUZE/BANDAGES/DRESSINGS) ×2 IMPLANT
GLOVE BIO SURGEON STRL SZ 6.5 (GLOVE) ×2 IMPLANT
GLOVE BIO SURGEON STRL SZ8 (GLOVE) ×2 IMPLANT
GLOVE BIO SURGEON STRL SZ8.5 (GLOVE) ×2 IMPLANT
GLOVE BIOGEL PI IND STRL 8.5 (GLOVE) IMPLANT
GLOVE BIOGEL PI INDICATOR 8.5 (GLOVE)
GLOVE INDICATOR 7.0 STRL GRN (GLOVE) ×2 IMPLANT
GLOVE SURG ORTHO 8.0 STRL STRW (GLOVE) IMPLANT
GOWN PREVENTION PLUS XLARGE (GOWN DISPOSABLE) ×4 IMPLANT
GOWN PREVENTION PLUS XXLARGE (GOWN DISPOSABLE) ×4 IMPLANT
HEAD RADIAL ALIGN 20MM (Head) ×2 IMPLANT
NEEDLE 1/2 CIR CATGUT .05X1.09 (NEEDLE) IMPLANT
NEEDLE HYPO 25X1 1.5 SAFETY (NEEDLE) IMPLANT
NS IRRIG 1000ML POUR BTL (IV SOLUTION) ×2 IMPLANT
PACK BASIN DAY SURGERY FS (CUSTOM PROCEDURE TRAY) ×2 IMPLANT
PAD CAST 3X4 CTTN HI CHSV (CAST SUPPLIES) IMPLANT
PAD CAST 4YDX4 CTTN HI CHSV (CAST SUPPLIES) IMPLANT
PADDING CAST ABS 4INX4YD NS (CAST SUPPLIES) ×1
PADDING CAST ABS COTTON 4X4 ST (CAST SUPPLIES) ×1 IMPLANT
PADDING CAST COTTON 3X4 STRL (CAST SUPPLIES)
PADDING CAST COTTON 4X4 STRL (CAST SUPPLIES)
PADDING UNDERCAST 2  STERILE (CAST SUPPLIES) IMPLANT
PADDING WEBRIL 3 STERILE (GAUZE/BANDAGES/DRESSINGS) ×2 IMPLANT
PENCIL BUTTON HOLSTER BLD 10FT (ELECTRODE) IMPLANT
SHEET MEDIUM DRAPE 40X70 STRL (DRAPES) ×4 IMPLANT
SPLINT FAST PLASTER 5X30 (CAST SUPPLIES) ×10
SPLINT PLASTER 5X30 (CAST SUPPLIES) ×2 IMPLANT
SPLINT PLASTER CAST FAST 5X30 (CAST SUPPLIES) ×10 IMPLANT
SPONGE GAUZE 4X4 12PLY (GAUZE/BANDAGES/DRESSINGS) ×2 IMPLANT
SPONGE GAUZE 4X4 FOR O.R. (GAUZE/BANDAGES/DRESSINGS) ×2 IMPLANT
STAPLER VISISTAT (STAPLE) IMPLANT
STOCKINETTE 4X48 STRL (DRAPES) ×2 IMPLANT
SUCTION FRAZIER TIP 10 FR DISP (SUCTIONS) ×2 IMPLANT
SUT ETHILON 4 0 PS 2 18 (SUTURE) IMPLANT
SUT MERSILENE 4 0 P 3 (SUTURE) IMPLANT
SUT PROLENE 3 0 PS 2 (SUTURE) ×2 IMPLANT
SUT SILK 2 0 FS (SUTURE) IMPLANT
SUT VIC AB 0 CT1 27 (SUTURE) ×2
SUT VIC AB 0 CT1 27XBRD ANBCTR (SUTURE) ×1 IMPLANT
SUT VIC AB 2-0 SH 27 (SUTURE) ×2
SUT VIC AB 2-0 SH 27XBRD (SUTURE) ×1 IMPLANT
SUT VIC AB 3-0 FS2 27 (SUTURE) IMPLANT
SUT VICRYL RAPIDE 4/0 PS 2 (SUTURE) IMPLANT
SYR BULB 3OZ (MISCELLANEOUS) ×2 IMPLANT
SYRINGE 10CC LL (SYRINGE) ×2 IMPLANT
TOWEL OR 17X24 6PK STRL BLUE (TOWEL DISPOSABLE) ×4 IMPLANT
TUBE CONNECTING 20X1/4 (TUBING) ×2 IMPLANT
UNDERPAD 30X30 INCONTINENT (UNDERPADS AND DIAPERS) ×2 IMPLANT
WATER STERILE IRR 1000ML POUR (IV SOLUTION) ×2 IMPLANT
align radial head and lock screw (Screw) ×2 IMPLANT

## 2011-02-01 NOTE — Anesthesia Postprocedure Evaluation (Signed)
  Anesthesia Post-op Note  Patient: Jennifer Wu  Procedure(s) Performed:  RADIAL HEAD IMPLANT - left radial head replacement  Patient Location: PACU  Anesthesia Type: GA combined with regional for post-op pain  Level of Consciousness: awake, alert  and oriented  Airway and Oxygen Therapy: Patient Spontanous Breathing  Post-op Pain: none  Post-op Assessment: Post-op Vital signs reviewed, Patient's Cardiovascular Status Stable, Respiratory Function Stable, Patent Airway, No signs of Nausea or vomiting, Adequate PO intake and Pain level controlled  Post-op Vital Signs: stable  Complications: No apparent anesthesia complications

## 2011-02-01 NOTE — Op Note (Signed)
NAME:  Jennifer Wu, Jennifer Wu                    ACCOUNT NO.:  MEDICAL RECORD NO.:  [id number]  LOCATION:                                 FACILITY:  PHYSICIAN:  Artist Pais. Kynzlie Hilleary, M.D.DATE OF BIRTH:  06-24-1940  DATE OF PROCEDURE:  02/01/2011 DATE OF DISCHARGE:                              OPERATIVE REPORT   PREOPERATIVE DIAGNOSIS:  Comminuted left radial head fracture.  POSTOPERATIVE DIAGNOSIS:  Comminuted left radial head fracture.  PROCEDURE:  Radial head replacement using Align prosthetic radial head #7 stem, 20 head, +2 neck.  SURGEON:  Artist Pais. Mina Marble, MD and Dr. Merlyn Lot.  ANESTHESIA:  General and axillary block.  TOURNIQUET TIME:  1 hour and 6 minutes.  COMPLICATIONS:  No complications.  DRAINS:  No drains.  The patient was taken to the operating suite.  After induction of adequate axillary block analgesia and then a general anesthetic laryngeal mask airway, left upper extremity was prepped and draped in usual sterile fashion.  An Esmarch was used to exsanguinate the limb. Tourniquet was then inflated to 265 mmHg.  At this point in time, a standard Kocher approach to the lateral aspect of the elbow was undertaken.  Skin was incised.  The palpable radial capitellar joint was detected underlying the extensor musculature.  Muscle split was undertaken.  The capsule overlying the radial capitellar joint was carefully incised revealing the fracture site.  The radial nerve was carefully identified and retracted in the anterior aspect of the wound. Once this was done, visualization revealed a comminuted 3-4 part radial head fracture, the radial head fragments were carefully excised, placed on the back table for sizing of the radial head prosthetic replacement. This was deemed to be #20 size head.  Once this was done, a complete synovectomy was performed.  Sagittal saw was then used to make a neck cut until the adequate length had been established.  Once this was done, the  intramedullary canal was reamed using the reamers on the Align radial head set as well as a curved curettes, #7 stem and the #8 stem were trialed as well as a 20 mm head, +2 neck all deemed to be in appropriate position clinically and radiographically.  Once this thumb was thoroughly irrigated, the stem was placed.  Using the external alignment guide, anatomic alignment was achieved and the modular head and neck were then placed and locked using the torque wrench. Intraoperative fluoroscopy then revealed adequate reduction in the AP and lateral plane with good motion of both flexion, extension, pronation, and supination.  Stability was achieved.  The wound was then thoroughly irrigated.  The capsular layers were closed with 0-Vicryl suture followed by 2-0 undyed Vicryl on the subcutaneous sutures and 3-0 Prolene subcuticular stitch on the skin.  Steri-Strips, 4x4s, fluffs, and a compressive dressing was applied as well as a sugar-tong splint with the elbow flexed in 90 degrees and forearm in neutral.  The patient tolerated the procedure well and went to the recovery room in a stable fashion.     Artist Pais Mina Marble, M.D.     MAW/MEDQ  D:  02/01/2011  T:  02/01/2011  Job:  [id number]

## 2011-02-01 NOTE — H&P (Signed)
  See dictated note [id number] 

## 2011-02-01 NOTE — Transfer of Care (Signed)
Immediate Anesthesia Transfer of Care Note  Patient: Jennifer Wu  Procedure(s) Performed:  RADIAL HEAD IMPLANT - left radial head replacement  Patient Location: PACU  Anesthesia Type: General and Regional  Level of Consciousness: awake  Airway & Oxygen Therapy: Patient Spontanous Breathing and Patient connected to face mask oxygen  Post-op Assessment: Report given to PACU RN and Post -op Vital signs reviewed and stable  Post vital signs: Reviewed and stable  Complications: No apparent anesthesia complications

## 2011-02-01 NOTE — H&P (View-Only) (Signed)
  See dictated note [phone]

## 2011-02-01 NOTE — Anesthesia Preprocedure Evaluation (Signed)
Anesthesia Evaluation  Patient identified by MRN, date of birth, ID band Patient awake    Reviewed: Allergy & Precautions, H&P , NPO status , Patient's Chart, lab work & pertinent test results  Airway Mallampati: II TM Distance: >3 FB Neck ROM: Full    Dental  (+) Poor Dentition   Pulmonary  clear to auscultation        Cardiovascular Regular Normal    Neuro/Psych  Neuromuscular disease    GI/Hepatic   Endo/Other    Renal/GU      Musculoskeletal   Abdominal (+) obese,   Peds  Hematology   Anesthesia Other Findings   Reproductive/Obstetrics                           Anesthesia Physical Anesthesia Plan  ASA: II  Anesthesia Plan: General   Post-op Pain Management:    Induction: Intravenous  Airway Management Planned: LMA  Additional Equipment:   Intra-op Plan:   Post-operative Plan: Extubation in OR  Informed Consent: I have reviewed the patients History and Physical, chart, labs and discussed the procedure including the risks, benefits and alternatives for the proposed anesthesia with the patient or authorized representative who has indicated his/her understanding and acceptance.     Plan Discussed with:   Anesthesia Plan Comments:         Anesthesia Quick Evaluation

## 2011-02-01 NOTE — H&P (Signed)
NAME:  Jennifer Wu, Jennifer Wu                    ACCOUNT NO.:  MEDICAL RECORD NO.:  [id number]  LOCATION:                                 FACILITY:  PHYSICIAN:  Artist Pais. Keonta Alsip, M.D.DATE OF BIRTH:  April 14, 1940  DATE OF ADMISSION: DATE OF DISCHARGE:                             HISTORY & PHYSICAL   HISTORY OF PRESENT ILLNESS:  Jennifer Wu is a 70 year old female, who is 608 pounds who fell on January 23, 2011, referred here by Dr. Natalia Leatherwood for a radial head fracture, nondominant left elbow.  She fell in the backyard on concrete around the floor on January 23, 2011.  The pain is constant in nature, throbbing-ache type pain associated with swelling. She denies any extremity issues in the past.  She has no known drug allergies.  She is not diabetic.  REVIEW OF SYSTEM:  Significant for cancer in 2000.  Reading glasses for corrective vision.  Breast cancer.  All systems are reviewed and noncontributory.  MEDICATIONS:  She is currently taking Prilosec.  No other medications were listed.  PAST SURGICAL HISTORY:  Status post back surgery, cancer surgery, colonoscopy, and meniscal repair in the past.  No recent hospitalizations or surgery.  FAMILY HISTORY:  Noncontributory except for diabetes, heart disease, hypertension and arthritis.  SOCIAL HISTORY:  Noncontributory.  She is retired.  EXAMINATION:  GENERAL: A well-nourished female, alert and oriented x3. MUSCULOSKELETAL:  She has normal gait.  She has good range of motion in the left shoulder.  She is in a posterior elbow splint.  Neurosensory exam is intact.  No significant digital swelling.  X-RAYS:  Show a comminuted radial head fracture on the left side with multiple fragments.  IMPRESSION:  This is a 71 year old female with a fracture of the radial head.  Nondominant left elbow, comminuted.  We discussed radial head replacement.  We will do this tomorrow as an elective patient at [facility].     Artist Pais  Mina Marble, M.D.     MAW/MEDQ  D:  01/31/2011  T:  01/31/2011  Job:  [id number]

## 2011-02-01 NOTE — Anesthesia Procedure Notes (Addendum)
Anesthesia Regional Block:  Axillary brachial plexus block  Pre-Anesthetic Checklist: ,, timeout performed, Correct Patient, Correct Site, Correct Procedure, Correct Position, site marked, risks and benefits discussed, surgical consent, pre-op evaluation,  At surgeon's request and post-op pain management  Laterality: Left  Prep: chloraprep       Needles:  Injection technique: Single-shot  Needle Type: Other   (22g short bevel)    Needle Gauge: 22 and 22 G    Additional Needles:  Procedures: transarterial technique Axillary brachial plexus block Narrative:  Start time: 02/01/2011 11:10 AM End time: 02/01/2011 11:18 AM  Performed by: Personally    Performed by: Gladys Damme    Procedure Name: LMA Insertion Date/Time: 02/01/2011 11:36 AM Performed by: Gladys Damme Pre-anesthesia Checklist: Patient identified, Emergency Drugs available, Suction available, Patient being monitored and Timeout performed Patient Re-evaluated:Patient Re-evaluated prior to inductionOxygen Delivery Method: Circle System Utilized Preoxygenation: Pre-oxygenation with 100% oxygen Intubation Type: IV induction Ventilation: Mask ventilation without difficulty and Oral airway inserted - appropriate to patient size LMA: LMA with gastric port inserted LMA Size: 4.0 Placement Confirmation: positive ETCO2 and breath sounds checked- equal and bilateral Dental Injury: Teeth and Oropharynx as per pre-operative assessment     Anesthesia Regional Block:   Narrative:

## 2011-02-01 NOTE — Interval H&P Note (Signed)
History and Physical Interval Note:   02/01/2011   10:31 AM   Jennifer Wu  has presented today for surgery, with the diagnosis of left radial head fracture  The various methods of treatment have been discussed with the patient and family. After consideration of risks, benefits and other options for treatment, the patient has consented to  Procedure(s): RADIAL HEAD IMPLANT as a surgical intervention .  The patients' history has been reviewed, patient examined, no change in status, stable for surgery.  I have reviewed the patients' chart and labs.  Questions were answered to the patient's satisfaction.     Marlowe Shores  MD

## 2011-02-01 NOTE — Interval H&P Note (Signed)
History and Physical Interval Note:   02/01/2011   10:33 AM   Jennifer Wu  has presented today for surgery, with the diagnosis of left radial head fracture  The various methods of treatment have been discussed with the patient and family. After consideration of risks, benefits and other options for treatment, the patient has consented to  Procedure(s): RADIAL HEAD IMPLANT as a surgical intervention .  The patients' history has been reviewed, patient examined, no change in status, stable for surgery.  I have reviewed the patients' chart and labs.  Questions were answered to the patient's satisfaction.     Marlowe Shores  MD

## 2011-02-01 NOTE — Brief Op Note (Signed)
02/01/2011  1:27 PM  PATIENT:  Rolena Infante  70 y.o. female  PRE-OPERATIVE DIAGNOSIS:  left radial head fracture  POST-OPERATIVE DIAGNOSIS:  same   PROCEDURE:  Procedure(s): RADIAL HEAD IMPLANT  SURGEON:  Surgeon(s): Marlowe Shores, MD Nicki Reaper, MD  PHYSICIAN ASSISTANT:   ASSISTANTSMerlyn Lot   ANESTHESIA:   general  EBL:  Total I/O In: 1600 [I.V.:1600] Out: -   BLOOD ADMINISTERED:none  DRAINS: none   LOCAL MEDICATIONS USED:  NONE  SPECIMEN:  No Specimen  DISPOSITION OF SPECIMEN:  N/A  COUNTS:  YES  TOURNIQUET:   Total Tourniquet Time Documented: Upper Arm (Left) - 90 minutes  DICTATION: .Other Dictation: Dictation Number [id number]  PLAN OF CARE: Discharge to home after PACU  PATIENT DISPOSITION:  PACU - hemodynamically stable.   Delay start of Pharmacological VTE agent (>24hrs) due to surgical blood loss or risk of bleeding:  not applicable

## 2011-02-02 ENCOUNTER — Other Ambulatory Visit: Payer: Self-pay | Admitting: Orthopedic Surgery

## 2011-02-06 ENCOUNTER — Encounter (HOSPITAL_BASED_OUTPATIENT_CLINIC_OR_DEPARTMENT_OTHER): Payer: Self-pay | Admitting: Orthopedic Surgery

## 2011-05-26 ENCOUNTER — Encounter (HOSPITAL_COMMUNITY): Payer: Self-pay

## 2011-05-30 ENCOUNTER — Encounter (HOSPITAL_COMMUNITY): Payer: Self-pay

## 2011-05-30 ENCOUNTER — Encounter (HOSPITAL_COMMUNITY)
Admission: RE | Admit: 2011-05-30 | Discharge: 2011-05-30 | Disposition: A | Discharge status: Home / Self Care | Payer: Medicare Other | Source: Ambulatory Visit | Attending: Obstetrics and Gynecology | Admitting: Obstetrics and Gynecology

## 2011-05-30 HISTORY — DX: Other seasonal allergic rhinitis: J30.2

## 2011-05-30 HISTORY — DX: Gastro-esophageal reflux disease without esophagitis: K21.9

## 2011-05-30 LAB — CBC
HCT: 38.8 % (ref 36.0–46.0)
Hemoglobin: 12.2 g/dL (ref 12.0–15.0)
MCH: 27.9 pg (ref 26.0–34.0)
MCHC: 31.4 g/dL (ref 30.0–36.0)
MCV: 88.6 fL (ref 78.0–100.0)
Platelets: 261 10*3/uL (ref 150–400)
RBC: 4.38 MIL/uL (ref 3.87–5.11)
RDW: 13.9 % (ref 11.5–15.5)
WBC: 5.8 10*3/uL (ref 4.0–10.5)

## 2011-05-30 LAB — COMPREHENSIVE METABOLIC PANEL WITH GFR
ALT: 16 U/L (ref 0–35)
AST: 20 U/L (ref 0–37)
Albumin: 3.8 g/dL (ref 3.5–5.2)
Alkaline Phosphatase: 49 U/L (ref 39–117)
BUN: 15 mg/dL (ref 6–23)
CO2: 30 meq/L (ref 19–32)
Calcium: 9.6 mg/dL (ref 8.4–10.5)
Chloride: 101 meq/L (ref 96–112)
Creatinine, Ser: 0.58 mg/dL (ref 0.50–1.10)
GFR calc Af Amer: >90 mL/min
GFR calc non Af Amer: >90 mL/min
Glucose, Bld: 94 mg/dL (ref 70–99)
Potassium: 3.1 meq/L — ABNORMAL LOW (ref 3.5–5.1)
Sodium: 139 meq/L (ref 135–145)
Total Bilirubin: 0.2 mg/dL — ABNORMAL LOW (ref 0.3–1.2)
Total Protein: 6.8 g/dL (ref 6.0–8.3)

## 2011-05-30 NOTE — Patient Instructions (Addendum)
   Your procedure is scheduled on:  Friday, March 15th  Enter through the Main Entrance of [facility] at:  [employer] up the phone at the desk and dial [phone] and inform us of your arrival.  Please call this number if you have any problems the morning of surgery: [phone]  Remember: Do not eat food after midnight: Thursday Do not drink clear liquids after: Thursday Take these medicines the morning of surgery with a SIP OF WATER: prilosec  Do not wear jewelry, make-up, or FINGER nail polish Do not wear lotions, powders, perfumes or deodorant. Do not shave 48 hours prior to surgery. Do not bring valuables to the hospital. Contacts, dentures or bridgework may not be worn into surgery.  Leave suitcase in the car. After Surgery it may be brought to your room. For patients being admitted to the hospital, checkout time is 11:00am the day of discharge.    Patients discharged on the day of surgery will not be allowed to drive home.  Home with Home with husband Link Snuffer  cell [phone]   Remember to use your hibiclens as instructed.Please shower with 1/2 bottle the evening before your surgery and the other 1/2 bottle the morning of surgery. Neck down avoiding private area.

## 2011-06-08 NOTE — H&P (Signed)
Jennifer Wu is an 71 y.o. female. She was seen in the office on 2-18, referred for PMB about 3 weeks prior for 3 days.  No other symptoms, bleeding has resolved.  Unable to do pipelle in the office due to a stenotic cervix.  Pelvic ultrasound revealed a normal uterus except a 17 mm endometrium with a 1.5 cm intracavitary mass, a 4 cm left ovarian simple cyst, and a small complex cyst posterior to the cervix.    Pertinent Gynecological History: Last pap: normal Date: 1-2 years ago, no history of abnormal Pap smears OB History: G4, P3013   Menstrual History: No LMP recorded. Patient is postmenopausal.    Past Medical History  Diagnosis Date  . High cholesterol   . 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 Cancer 2000-rt lumpectomy-no bp rt arm  . GERD (gastroesophageal reflux disease)   . Seasonal allergies   . Arthritis     knees - otc med prn    Past Surgical History  Procedure Date  . Knee surgery 10/12    right knee  . Breast surgery   . Lymph node biopsy     right breast  . Colonoscopy   . Lumbar laminectomy 1991  . Back surgery   . Fracture surgery rt foot-1990  . Radial head implant 02/01/2011    Procedure: RADIAL HEAD IMPLANT;  Surgeon: Marlowe Shores, MD;  Location: [facility];  Service: Orthopedics;  Laterality: Left;  left radial head replacement  . Dilation and curettage of uterus 1976    MAB  . Svd     x 3    No family history on file.  Social History:  reports that she has never smoked. She has never used smokeless tobacco. She reports that she does not drink alcohol or use illicit drugs.  Allergies: No Known Allergies  No prescriptions prior to admission    Review of Systems  Respiratory: Negative.   Cardiovascular: Negative.   Gastrointestinal: Negative.   Genitourinary: Negative.     There were no vitals taken for this visit. Physical Exam    Constitutional: She appears well-developed and well-nourished.  Cardiovascular: Normal rate, regular rhythm and normal heart sounds.   No murmur heard. Respiratory: Effort normal and breath sounds normal. No respiratory distress. She has no wheezes.  GI: Soft. She exhibits no mass. There is no tenderness.  Genitourinary:       EGBUS no lesions Vagina normal Cervix had a 1 cm benign polyp removed in the office Uterus- small, midplanar, NT, no mass No adnexal mass Rectovaginal exam confirms    No results found for this or any previous visit (from the past 24 hour(s)).  No results found.  Assessment/Plan: Postmenopausal bleeding with stenotic cervix, thickened endometrium, and possible endometrial mass.  Discussed the need to r/o hyperplasia/neoplasia.  Discussed hysteroscopy, D&C procedure, risks, alternatives, chances of being able to make a diagnosis.  Will admit for hysteroscopy, D&C and possible resection.  Her other issues will be addressed once we have r/o hyper/neoplasia, she is interested in a hysterectomy at some point.    Jennifer Wu D 06/08/2011, 7:46 PM

## 2011-06-09 ENCOUNTER — Encounter (HOSPITAL_COMMUNITY)
Admission: RE | Disposition: A | Discharge status: Home / Self Care | Payer: Self-pay | Source: Ambulatory Visit | Attending: Obstetrics and Gynecology

## 2011-06-09 ENCOUNTER — Ambulatory Visit (HOSPITAL_COMMUNITY)
Admission: RE | Admit: 2011-06-09 | Discharge: 2011-06-09 | Disposition: A | Discharge status: Home / Self Care | Payer: Medicare Other | Source: Ambulatory Visit | Attending: Obstetrics and Gynecology | Admitting: Obstetrics and Gynecology

## 2011-06-09 ENCOUNTER — Ambulatory Visit (HOSPITAL_COMMUNITY): Payer: Medicare Other | Admitting: Anesthesiology

## 2011-06-09 ENCOUNTER — Encounter (HOSPITAL_COMMUNITY): Payer: Self-pay | Admitting: Anesthesiology

## 2011-06-09 DIAGNOSIS — Z01812 Encounter for preprocedural laboratory examination: Secondary | ICD-10-CM | POA: Insufficient documentation

## 2011-06-09 DIAGNOSIS — Z01818 Encounter for other preprocedural examination: Secondary | ICD-10-CM | POA: Insufficient documentation

## 2011-06-09 DIAGNOSIS — N950 Postmenopausal bleeding: Secondary | ICD-10-CM

## 2011-06-09 HISTORY — PX: HYSTEROSCOPY W/D&C: SHX1775

## 2011-06-09 SURGERY — DILATATION AND CURETTAGE /HYSTEROSCOPY
Anesthesia: General | Site: Vagina | Wound class: Clean Contaminated

## 2011-06-09 MED ORDER — LIDOCAINE HCL 2 % IJ SOLN
INTRAMUSCULAR | Status: AC
  Filled 2011-06-09: qty 1

## 2011-06-09 MED ORDER — PROPOFOL 10 MG/ML IV EMUL
INTRAVENOUS | Status: DC | PRN
  Administered 2011-06-09: 150 mg via INTRAVENOUS

## 2011-06-09 MED ORDER — FENTANYL CITRATE 0.05 MG/ML IJ SOLN
INTRAMUSCULAR | Status: AC
  Administered 2011-06-09: 50 ug via INTRAVENOUS
  Filled 2011-06-09: qty 2

## 2011-06-09 MED ORDER — FENTANYL CITRATE 0.05 MG/ML IJ SOLN
INTRAMUSCULAR | Status: AC
  Filled 2011-06-09: qty 5

## 2011-06-09 MED ORDER — LIDOCAINE HCL 2 % IJ SOLN
INTRAMUSCULAR | Status: DC | PRN
  Administered 2011-06-09: 16 mL

## 2011-06-09 MED ORDER — FENTANYL CITRATE 0.05 MG/ML IJ SOLN
INTRAMUSCULAR | Status: DC | PRN
  Administered 2011-06-09 (×3): 50 ug via INTRAVENOUS

## 2011-06-09 MED ORDER — PROPOFOL 10 MG/ML IV EMUL
INTRAVENOUS | Status: AC
  Filled 2011-06-09: qty 20

## 2011-06-09 MED ORDER — LIDOCAINE HCL (CARDIAC) 20 MG/ML IV SOLN
INTRAVENOUS | Status: AC
  Filled 2011-06-09: qty 5

## 2011-06-09 MED ORDER — ONDANSETRON HCL 4 MG/2ML IJ SOLN
INTRAMUSCULAR | Status: AC
  Filled 2011-06-09: qty 2

## 2011-06-09 MED ORDER — PHENYLEPHRINE HCL 10 MG/ML IJ SOLN
INTRAMUSCULAR | Status: DC | PRN
  Administered 2011-06-09: 40 ug via INTRAVENOUS
  Administered 2011-06-09: 80 ug via INTRAVENOUS
  Administered 2011-06-09 (×2): 40 ug via INTRAVENOUS

## 2011-06-09 MED ORDER — FENTANYL CITRATE 0.05 MG/ML IJ SOLN
25.0000 ug | INTRAMUSCULAR | Status: DC | PRN
  Administered 2011-06-09: 50 ug via INTRAVENOUS

## 2011-06-09 MED ORDER — LIDOCAINE HCL (CARDIAC) 20 MG/ML IV SOLN
INTRAVENOUS | Status: DC | PRN
  Administered 2011-06-09: 75 mg via INTRAVENOUS

## 2011-06-09 MED ORDER — LACTATED RINGERS IV SOLN
INTRAVENOUS | Status: DC
  Administered 2011-06-09: 09:00:00 via INTRAVENOUS
  Administered 2011-06-09: 75 mL/h via INTRAVENOUS

## 2011-06-09 MED ORDER — ONDANSETRON HCL 4 MG/2ML IJ SOLN
INTRAMUSCULAR | Status: DC | PRN
  Administered 2011-06-09: 4 mg via INTRAVENOUS

## 2011-06-09 MED ORDER — GLYCINE 1.5 % IR SOLN
Status: DC | PRN
  Administered 2011-06-09: 3000 mL

## 2011-06-09 MED ORDER — PHENYLEPHRINE 40 MCG/ML (10ML) SYRINGE FOR IV PUSH (FOR BLOOD PRESSURE SUPPORT)
PREFILLED_SYRINGE | INTRAVENOUS | Status: AC
  Filled 2011-06-09: qty 5

## 2011-06-09 SURGICAL SUPPLY — 19 items
ABLATOR ENDOMETRIAL BIPOLAR (ABLATOR) IMPLANT
CANISTER SUCTION 2500CC (MISCELLANEOUS) ×3 IMPLANT
CATH ROBINSON RED A/P 16FR (CATHETERS) ×3 IMPLANT
CLOTH BEACON ORANGE TIMEOUT ST (SAFETY) ×3 IMPLANT
CONTAINER PREFILL 10% NBF 60ML (FORM) ×6 IMPLANT
DILATOR CANAL MILEX (MISCELLANEOUS) ×3 IMPLANT
ELECT REM PT RETURN 9FT ADLT (ELECTROSURGICAL) ×3
ELECTRODE REM PT RTRN 9FT ADLT (ELECTROSURGICAL) ×2 IMPLANT
GLOVE BIO SURGEON STRL SZ8 (GLOVE) ×3 IMPLANT
GLOVE BIOGEL PI IND STRL 6.5 (GLOVE) ×2 IMPLANT
GLOVE BIOGEL PI INDICATOR 6.5 (GLOVE) ×1
GLOVE ECLIPSE 6.0 STRL STRAW (GLOVE) ×6 IMPLANT
GLOVE ORTHO TXT STRL SZ7.5 (GLOVE) ×3 IMPLANT
GOWN PREVENTION PLUS LG XLONG (DISPOSABLE) ×3 IMPLANT
GOWN STRL REIN XL XLG (GOWN DISPOSABLE) ×9 IMPLANT
LOOP ANGLED CUTTING 22FR (CUTTING LOOP) IMPLANT
PACK HYSTEROSCOPY LF (CUSTOM PROCEDURE TRAY) ×3 IMPLANT
TOWEL OR 17X24 6PK STRL BLUE (TOWEL DISPOSABLE) ×6 IMPLANT
WATER STERILE IRR 1000ML POUR (IV SOLUTION) ×3 IMPLANT

## 2011-06-09 NOTE — Op Note (Signed)
Preoperative diagnosis: Postmenopausal bleeding, possible endometrial lesion Postoperative diagnosis: Postmenopausal uterine bleeding Procedure: Hysteroscopy with dilation and curettage Surgeon: Lavina Hamman M.D. Anesthesia: Gen. With an LMA, paracervical block Findings:  She had some scarring in the endometrial cavity, cervix was stenotic, also difficult to distend uterus.  The cavity was otherwise normal except for a possible small lesion in the right cornu, possible piece of suture attached at 5:00 Estimated blood loss: Minimal Fluid deficit: Through the hysteroscope fluid deficit was about 160 cc Specimens: Endometrial curettings sent for routine pathology Complications: None  Procedure in detail: The patient was taken to the operating room and placed in the dorsosupine position. General anesthesia was induced. She was placed in mobile stirrups and legs were elevated. Perineum and vagina were prepped and draped in the usual sterile fashion and bladder drained with a red Robinson catheter. A Graves speculum was inserted in the vagina and the anterior lip of the cervix was grasped with a single-tooth tenaculum. Paracervical block was performed with a total of 16 cc of 2% plain lidocaine. I was initially unable to get through the internal os.  Using the os finder, I was able to get into the endometrial cavity, the uterus then sounded to 7 cm. The cervix was gradually dilated to a size 19 dilator with some difficulty. The observer hysteroscope was inserted and good visualization was achieved. The cavity was difficult to distend.  There was a small lesion in the right cornu.  This lesion was removed with a grasper through the scope.  I also attempted to remove what appeared to be a piece of suture. The hysteroscope was removed. Sharp curettage was performed which revealed minimal endometrial tissue. Hysteroscopy was again performed and revealed a normal cavity.  The single-tooth tenaculum was removed  from the cervix and bleeding was controlled with pressure. All instruments were then removed from the vagina. The patient tolerated the procedure well and was taken to the recovery in stable condition. Counts were correct and she had PAS hose on throughout the procedure.

## 2011-06-09 NOTE — Interval H&P Note (Signed)
History and Physical Interval Note:  06/09/2011 7:15 AM  Jennifer Wu  has presented today for surgery, with the diagnosis of Post Menopausal Bleeding, endometrial lesion  The various methods of treatment have been discussed with the patient and family. After consideration of risks, benefits and other options for treatment, the patient has consented to  Procedure(s) (LRB): DILATATION & CURETTAGE/HYSTEROSCOPY WITH RESECTOCOPE (N/A) as a surgical intervention .  The patients' history has been reviewed, patient examined, no change in status, stable for surgery.  I have reviewed the patients' chart and labs.  Questions were answered to the patient's satisfaction.     Eric Morganti D

## 2011-06-09 NOTE — Anesthesia Preprocedure Evaluation (Addendum)
Anesthesia Evaluation  Patient identified by MRN, date of birth, ID band Patient awake    Reviewed: Allergy & Precautions, H&P , Patient's Chart, lab work & pertinent test results, reviewed documented beta blocker date and time   Airway Mallampati: III TM Distance: >3 FB Neck ROM: full    Dental No notable dental hx. (+) Missing,    Pulmonary  breath sounds clear to auscultation  Pulmonary exam normal       Cardiovascular Rhythm:regular Rate:Normal     Neuro/Psych    GI/Hepatic GERD-  Medicated,  Endo/Other    Renal/GU      Musculoskeletal   Abdominal   Peds  Hematology   Anesthesia Other Findings   Reproductive/Obstetrics                          Anesthesia Physical Anesthesia Plan  ASA: II  Anesthesia Plan: General   Post-op Pain Management:    Induction: Intravenous  Airway Management Planned: LMA  Additional Equipment:   Intra-op Plan:   Post-operative Plan:   Informed Consent: I have reviewed the patients History and Physical, chart, labs and discussed the procedure including the risks, benefits and alternatives for the proposed anesthesia with the patient or authorized representative who has indicated his/her understanding and acceptance.   Dental Advisory Given  Plan Discussed with: CRNA and Surgeon  Anesthesia Plan Comments: (  Discussed  general anesthesia, including possible nausea, instrumentation of airway, sore throat,pulmonary aspiration, etc. I asked if the were any outstanding questions, or  concerns before we proceeded. )        Anesthesia Quick Evaluation

## 2011-06-09 NOTE — Discharge Instructions (Signed)
Routine instructions for hysteroscopy, D&C  DISCHARGE INSTRUCTIONS: HYSTEROSCOPY  The following instructions have been prepared to help you care for yourself upon your return home.  Personal hygiene: Marland Kitchen Use sanitary pads for vaginal drainage, not tampons. . Shower the day after your procedure. . NO tub baths, pools or Jacuzzis for 2-3 weeks. . Wipe front to back after using the bathroom.  Activity and limitations: . Do NOT drive or operate any equipment for 24 hours. The effects of anesthesia are still present and drowsiness may result. . Do NOT rest in bed all day. . Walking is encouraged. . Walk up and down stairs slowly. . You may resume your normal activity in one to two days or as indicated by your physician. Sexual activity: NO intercourse for at least 2 weeks after the procedure, or as indicated by your Doctor.  Diet: Eat a light meal as desired this evening. You may resume your usual diet tomorrow.  Return to Work: You may resume your work activities in one to two days or as indicated by Therapist, sports.  What to expect after your surgery: Expect to have vaginal bleeding/discharge for 2-3 days and spotting for up to 10 days. It is not unusual to have soreness for up to 1-2 weeks. You may have a slight burning sensation when you urinate for the first day. Mild cramps may continue for a couple of days. You may have a regular period in 2-6 weeks.  Call your doctor for any of the following: . Excessive vaginal bleeding or clotting, saturating and changing one pad every hour. . Inability to urinate 6 hours after discharge from hospital. . Pain not relieved by pain medication. . Fever of 100.4 F or greater. . Unusual vaginal discharge or odor.

## 2011-06-09 NOTE — Anesthesia Postprocedure Evaluation (Signed)
Anesthesia Post Note  Patient: Jennifer Wu  Procedure(s) Performed: Procedure(s) (LRB): DILATATION AND CURETTAGE /HYSTEROSCOPY (N/A)  Anesthesia type: General  Patient location: PACU  Post pain: Pain level controlled  Post assessment: Post-op Vital signs reviewed  Last Vitals:  Filed Vitals:   06/09/11 0815  BP: 134/67  Pulse: 81  Temp:   Resp: 16    Post vital signs: Reviewed  Level of consciousness: sedated  Complications: No apparent anesthesia complicationsfj

## 2011-06-09 NOTE — Transfer of Care (Signed)
Immediate Anesthesia Transfer of Care Note  Patient: Jennifer Wu  Procedure(s) Performed: Procedure(s) (LRB): DILATATION AND CURETTAGE /HYSTEROSCOPY (N/A)  Patient Location: PACU  Anesthesia Type: General  Level of Consciousness: awake, alert  and oriented  Airway & Oxygen Therapy: Patient Spontanous Breathing and Patient connected to nasal cannula oxygen  Post-op Assessment: Report given to PACU RN and Post -op Vital signs reviewed and stable  Post vital signs: stable  Complications: No apparent anesthesia complications

## 2011-06-13 ENCOUNTER — Encounter (HOSPITAL_COMMUNITY): Payer: Self-pay | Admitting: Obstetrics and Gynecology

## 2012-05-25 ENCOUNTER — Encounter: Payer: Self-pay | Admitting: *Deleted

## 2012-05-25 DIAGNOSIS — E782 Mixed hyperlipidemia: Secondary | ICD-10-CM | POA: Insufficient documentation

## 2012-07-19 ENCOUNTER — Encounter: Payer: Self-pay | Admitting: Family Medicine

## 2012-07-19 ENCOUNTER — Ambulatory Visit (INDEPENDENT_AMBULATORY_CARE_PROVIDER_SITE_OTHER): Payer: Medicare Other | Admitting: Family Medicine

## 2012-07-19 VITALS — BP 128/82 | HR 86 | Temp 99.5°F | Resp 16 | Wt 181.0 lb

## 2012-07-19 DIAGNOSIS — J45909 Unspecified asthma, uncomplicated: Secondary | ICD-10-CM

## 2012-07-19 DIAGNOSIS — J309 Allergic rhinitis, unspecified: Secondary | ICD-10-CM

## 2012-07-19 DIAGNOSIS — J302 Other seasonal allergic rhinitis: Secondary | ICD-10-CM

## 2012-07-19 MED ORDER — AZITHROMYCIN 250 MG PO TABS: ORAL_TABLET | ORAL | Status: DC

## 2012-07-19 MED ORDER — ALBUTEROL SULFATE (2.5 MG/3ML) 0.083% IN NEBU: 2.5000 mg | INHALATION_SOLUTION | Freq: Four times a day (QID) | RESPIRATORY_TRACT | Status: DC | PRN

## 2012-07-19 MED ORDER — ALBUTEROL SULFATE HFA 108 (90 BASE) MCG/ACT IN AERS: 2.0000 | INHALATION_SPRAY | Freq: Four times a day (QID) | RESPIRATORY_TRACT | Status: DC | PRN

## 2012-07-19 MED ORDER — PREDNISONE 20 MG PO TABS: ORAL_TABLET | ORAL | Status: DC

## 2012-07-19 NOTE — Progress Notes (Signed)
Subjective:    Patient ID: Jennifer Wu, female    DOB: 01/19/1941, 72 y.o.   MRN: [id number]  HPI Patient is a 72 year old female who presents with one-week of worsening cough, increasing shortness of breath, audible wheezing, fever, and productive cough. She has a history of severe allergies. She been taking her regular allergy medicine without relief. Then last week both her daughter and granddaughter have developed bronchitis. She called the illness from them. However her symptoms have drastically worsened. She denies any history of asthma. She denies any history of smoking. She denies any history of secondhand smoke exposure. Past Medical History  Diagnosis Date  . High cholesterol   . 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 Cancer 2000-rt lumpectomy-no bp rt arm  . GERD (gastroesophageal reflux disease)   . Seasonal allergies   . Arthritis     knees - otc med prn  . Mixed dyslipidemia    Current Outpatient Prescriptions on File Prior to Visit  Medication Sig Dispense Refill  . aspirin 325 MG tablet Take 325 mg by mouth daily. Takes 30 minutes prior to niacin      . Cholecalciferol (VITAMIN D) 2000 UNITS tablet Take 2,000 Units by mouth daily.      . Choline Fenofibrate 135 MG capsule Take 135 mg by mouth at bedtime.      . diphenhydrAMINE (BENADRYL) 25 MG tablet Take 25-50 mg by mouth as needed. allergies      . Niacin-Simvastatin 1000-40 MG TB24 Take 1 tablet by mouth at bedtime.      Marland Kitchen omeprazole (PRILOSEC OTC) 20 MG tablet Take 20 mg by mouth daily.        Marland Kitchen OVER THE COUNTER MEDICATION Take 1 capsule by mouth daily. Shiff Omega 3 krill oil, but not sure of strength      . OVER THE COUNTER MEDICATION Take 1 tablet by mouth daily. 1,000mg  of cinnamon and chromium tablet      . pyridoxine (B-6) 200 MG tablet Take 200 mg by mouth daily.       No current facility-administered medications on  file prior to visit.   No Known Allergies History   Social History  . Marital Status: Married    Spouse Name: N/A    Number of Children: N/A  . Years of Education: N/A   Occupational History  . Not on file.   Social History Main Topics  . Smoking status: Never Smoker   . Smokeless tobacco: Never Used  . Alcohol Use: No  . Drug Use: No  . Sexually Active: Yes    Birth Control/ Protection: Post-menopausal   Other Topics Concern  . Not on file   Social History Narrative  . No narrative on file      Review of Systems  All other systems reviewed and are negative.       Objective:   Physical Exam  Constitutional: She appears well-developed and well-nourished.  HENT:  Right Ear: Tympanic membrane and ear canal normal.  Left Ear: Tympanic membrane and ear canal normal.  Nose: Mucosal edema and rhinorrhea present. Right sinus exhibits no maxillary sinus tenderness and no frontal sinus tenderness. Left sinus exhibits no maxillary sinus tenderness and no frontal sinus tenderness.  Eyes: Conjunctivae are normal. Pupils are equal, round, and reactive to light.  Neck: Neck supple.  Cardiovascular: Normal rate and normal heart sounds.   No murmur heard. Pulmonary/Chest: Effort normal.  She has decreased breath sounds. She has wheezes.  Abdominal: Soft. Bowel sounds are normal.  Lymphadenopathy:    She has no cervical adenopathy.          Assessment & Plan:  1. Asthmatic bronchitis Has bronchitis with symptoms consistent with an asthma exacerbation. Gave patient a 2.5 mg albuterol nebulizers here. She is then to start the Z-Pak, prednisone, and when necessary Proventil as soon as possible at home. She's to followup next week if no better sooner if worse. - azithromycin (ZITHROMAX) 250 MG tablet; 2 pills on day 1, 1 pill on days 2-5  Dispense: 6 tablet; Refill: 0 - predniSONE (DELTASONE) 20 MG tablet; 3 tabs on days 1-2, 2 tabs on days 3-4, 1 tab on days 5-6  Dispense:  18 tablet; Refill: 0 - albuterol (PROVENTIL HFA;VENTOLIN HFA) 108 (90 BASE) MCG/ACT inhaler; Inhale 2 puffs into the lungs every 6 (six) hours as needed for wheezing.  Dispense: 1 Inhaler; Refill: 0 - albuterol (PROVENTIL) (2.5 MG/3ML) 0.083% nebulizer solution; Take 3 mLs (2.5 mg total) by nebulization every 6 (six) hours as needed for wheezing.  Dispense: 150 mL; Refill: 1  2. Seasonal allergies She is to continue her seasonal allergy medicines

## 2012-07-24 ENCOUNTER — Encounter: Payer: Self-pay | Admitting: Physician Assistant

## 2012-07-24 ENCOUNTER — Ambulatory Visit (INDEPENDENT_AMBULATORY_CARE_PROVIDER_SITE_OTHER): Payer: Medicare PPO | Admitting: Physician Assistant

## 2012-07-24 VITALS — BP 146/96 | HR 76 | Temp 97.4°F | Resp 18 | Ht 64.0 in | Wt 182.0 lb

## 2012-07-24 DIAGNOSIS — J309 Allergic rhinitis, unspecified: Secondary | ICD-10-CM

## 2012-07-24 DIAGNOSIS — J45901 Unspecified asthma with (acute) exacerbation: Secondary | ICD-10-CM

## 2012-07-24 DIAGNOSIS — E559 Vitamin D deficiency, unspecified: Secondary | ICD-10-CM

## 2012-07-24 DIAGNOSIS — E782 Mixed hyperlipidemia: Secondary | ICD-10-CM

## 2012-07-24 DIAGNOSIS — J302 Other seasonal allergic rhinitis: Secondary | ICD-10-CM

## 2012-07-24 LAB — COMPLETE METABOLIC PANEL WITH GFR
ALT: 14 U/L (ref 0–35)
AST: 9 U/L (ref 0–37)
Albumin: 4 g/dL (ref 3.5–5.2)
Alkaline Phosphatase: 52 U/L (ref 39–117)
BUN: 13 mg/dL (ref 6–23)
CO2: 30 mEq/L (ref 19–32)
Calcium: 9.4 mg/dL (ref 8.4–10.5)
Chloride: 104 mEq/L (ref 96–112)
Creat: 0.63 mg/dL (ref 0.50–1.10)
GFR, Est African American: >89 mL/min
GFR, Est Non African American: >89 mL/min
Glucose, Bld: 89 mg/dL (ref 70–99)
Potassium: 4 mEq/L (ref 3.5–5.3)
Sodium: 142 mEq/L (ref 135–145)
Total Bilirubin: 0.3 mg/dL (ref 0.3–1.2)
Total Protein: 6.6 g/dL (ref 6.0–8.3)

## 2012-07-24 LAB — LIPID PANEL
Cholesterol: 124 mg/dL (ref 0–200)
HDL: 48 mg/dL (ref >39)
LDL Cholesterol: 59 mg/dL (ref 0–99)
Total CHOL/HDL Ratio: 2.6 Ratio
Triglycerides: 87 mg/dL (ref <150)
VLDL: 17 mg/dL (ref 0–40)

## 2012-07-24 MED ORDER — CHOLINE FENOFIBRATE 135 MG PO CPDR: 135.0000 mg | DELAYED_RELEASE_CAPSULE | Freq: Every day | ORAL | Status: DC

## 2012-07-24 MED ORDER — NIACIN-SIMVASTATIN ER 1000-40 MG PO TB24: 1.0000 | ORAL_TABLET | Freq: Every day | ORAL | Status: DC

## 2012-07-24 MED ORDER — MONTELUKAST SODIUM 10 MG PO TABS: 10.0000 mg | ORAL_TABLET | Freq: Every day | ORAL | Status: DC

## 2012-07-24 MED ORDER — BECLOMETHASONE DIPROPIONATE 80 MCG/ACT IN AERS: 1.0000 | INHALATION_SPRAY | RESPIRATORY_TRACT | Status: DC | PRN

## 2012-07-25 NOTE — Progress Notes (Signed)
Patient ID: Jennifer Wu MRN: [id number], DOB: 1940/08/23, 72 y.o. Date of Encounter: @DATE @  Chief Complaint:  Chief Complaint  Patient presents with  . f/u  recheck astma from last week    HPI: 72 y.o. year old female  presents with :  1- Saw Dr Tanya Nones 07/19/12 sec to bronchitis. Treated with neb in office. Outpt tx: ZPack, Prednisone, Albuterol.  Says she is still wheezing some. "Last night was the first night my chest didn't feel heavy when I layed down." Still coughing up some phlegm but not as much as before.  Completed abx yesterday. Has one prednisone left. Using alb Q 6 hrs.  Says when blows nose there is still some htick mucus but it is better.   2- Also needs refill on chol meds. Taking Simcor and Trilipix. No myalgias. No adv effects.   3- Vit D Def: Taking 1,000 IU QD.   Past Medical History  Diagnosis Date  . High cholesterol   . 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 Cancer 2000-rt lumpectomy-no bp rt arm  . GERD (gastroesophageal reflux disease)   . Seasonal allergies   . Arthritis     knees - otc med prn  . Mixed dyslipidemia   . Vitamin D deficiency      Home Meds: Current Outpatient Prescriptions on File Prior to Visit  Medication Sig Dispense Refill  . albuterol (PROVENTIL HFA;VENTOLIN HFA) 108 (90 BASE) MCG/ACT inhaler Inhale 2 puffs into the lungs every 6 (six) hours as needed for wheezing.  1 Inhaler  0  . albuterol (PROVENTIL) (2.5 MG/3ML) 0.083% nebulizer solution Take 3 mLs (2.5 mg total) by nebulization every 6 (six) hours as needed for wheezing.  150 mL  1  . aspirin 325 MG tablet Take 325 mg by mouth daily. Takes 30 minutes prior to niacin      . azithromycin (ZITHROMAX) 250 MG tablet 2 pills on day 1, 1 pill on days 2-5  6 tablet  0  . Cholecalciferol (VITAMIN D) 2000 UNITS tablet Take 2,000 Units by mouth daily.      . diphenhydrAMINE  (BENADRYL) 25 MG tablet Take 25-50 mg by mouth as needed. allergies      . omeprazole (PRILOSEC OTC) 20 MG tablet Take 20 mg by mouth daily.        Marland Kitchen OVER THE COUNTER MEDICATION Take 1 capsule by mouth daily. Shiff Omega 3 krill oil, but not sure of strength      . OVER THE COUNTER MEDICATION Take 1 tablet by mouth daily. 1,000mg  of cinnamon and chromium tablet      . Phenylephrine-APAP-Guaifenesin (MUCINEX FAST-MAX COLD & SINUS PO) Take by mouth.      . predniSONE (DELTASONE) 20 MG tablet 3 tabs on days 1-2, 2 tabs on days 3-4, 1 tab on days 5-6  18 tablet  0  . pyridoxine (B-6) 200 MG tablet Take 200 mg by mouth daily.       No current facility-administered medications on file prior to visit.    Allergies: No Known Allergies  History   Social History  . Marital Status: Married    Spouse Name: N/A    Number of Children: N/A  . Years of Education: N/A   Occupational History  . Not on file.   Social History Main Topics  . Smoking status: Never Smoker   . Smokeless tobacco: Never Used  . Alcohol  Use: No  . Drug Use: No  . Sexually Active: Yes    Birth Control/ Protection: Post-menopausal   Other Topics Concern  . Not on file   Social History Narrative  . No narrative on file    Family History  Problem Relation Age of Onset  . Heart disease Mother     MI's  . CAD Mother   . Diabetes Mother   . Alzheimer's disease Father   . Alcohol abuse Father     cirrhosis  . CAD Brother   . Diabetes Brother   . Heart disease Brother     MI's     Review of Systems: See HPI for positives and additional ros. Constitutional: negative for chills, fever, night sweats, weight changes, or fatigue  HEENT: negative for vision changes, hearing loss.  Cardiovascular: negative for chest pain or palpitations.  Respiratory: negative for hemoptysis Abdominal: negative for abdominal pain, nausea, vomiting, diarrhea, or constipation Dermatological: negative for rash or concerning skin  lesions Neurologic: negative for headache, dizziness, or syncope All other systems reviewed and are otherwise negative with the exception to those above and in the HPI.   Physical Exam: Blood pressure 146/96, pulse 76, temperature 97.4 F (36.3 C), temperature source Oral, resp. rate 18, height 5\' 4"  (1.626 m), weight 182 lb (82.555 kg)., Body mass index is 31.22 kg/(m^2). General: Well developed, well nourished, WF. Appears in no acute distress. Head: Normocephalic, atraumatic, eyes without discharge, sclera non-icteric, nares are without discharge. Bilateral auditory canals clear, TM's are without perforation, pearly grey and translucent with reflective cone of light bilaterally. Oral cavity moist, posterior pharynx without exudate, erythema, peritonsillar abscess, or post nasal drip.  Neck: Supple. No thyromegaly. Full ROM. No lymphadenopathy. Lungs: Clear bilaterally to auscultation without wheezes, rales, or rhonchi. Breathing is unlabored.I hear no significant wheezes now. Heart: RRR with S1 S2. No murmurs, rubs, or gallops. Musculoskeletal:  Strength and tone normal for age. Extremities/Skin: Warm and dry. No clubbing or cyanosis. No edema. No rashes or suspicious lesions. Neuro: Alert and oriented X 3. Moves all extremities spontaneously. Gait is normal. CNII-XII grossly in tact. Psych:  Responds to questions appropriately with a normal affect.     ASSESSMENT AND PLAN:  71 y.o. year old female with  1. Asthma with acute exacerbation She is already on singulair-needs refill. Will add QVar. Cont Albuterol Q 4-6 h prn. Recheck one week.  - montelukast (SINGULAIR) 10 MG tablet; Take 1 tablet (10 mg total) by mouth at bedtime.  Dispense: 30 tablet; Refill: 3 - beclomethasone (QVAR) 80 MCG/ACT inhaler; Inhale 1 puff into the lungs as needed.  Dispense: 1 Inhaler; Refill: 12  2. Seasonal allergies - montelukast (SINGULAIR) 10 MG tablet; Take 1 tablet (10 mg total) by mouth at bedtime.   Dispense: 30 tablet; Refill: 3 Cont Zyrtec  3. Mixed dyslipidemia - COMPLETE METABOLIC PANEL WITH GFR - Lipid panel - Niacin-Simvastatin 1000-40 MG TB24; Take 1 tablet by mouth at bedtime.  Dispense: 30 tablet; Refill: 5 - Choline Fenofibrate 135 MG capsule; Take 1 capsule (135 mg total) by mouth at bedtime.  Dispense: 30 capsule; Refill: 5  4. Vitamin D deficiency Cont 1,000 IU QD  5. Family h/o Premature CAD  6. H/O Breast Cancer: Mammo 01/29/2012-Negative  7. Colonoscopy- Had f/u 02/06/2012-Repeat 5 years.  8. Immunizations:  TDap: 01/23/11 Pneumovax: 01/24/12 Zostavax: Discussed at OV 10/13-she was to check with insurance-says ins changed-will f/u with current ins.   Signed, Shon Hale [place], [place], [place]  07/25/2012 1:46 PM

## 2012-07-31 ENCOUNTER — Ambulatory Visit (INDEPENDENT_AMBULATORY_CARE_PROVIDER_SITE_OTHER): Payer: Medicare Other | Admitting: Physician Assistant

## 2012-07-31 ENCOUNTER — Encounter: Payer: Self-pay | Admitting: Physician Assistant

## 2012-07-31 VITALS — BP 130/70 | HR 64 | Temp 97.8°F | Resp 20 | Ht 64.0 in | Wt 186.0 lb

## 2012-07-31 DIAGNOSIS — J069 Acute upper respiratory infection, unspecified: Secondary | ICD-10-CM

## 2012-07-31 NOTE — Progress Notes (Signed)
Patient ID: OLIVEA SONNEN MRN: [id number], DOB: [birth year], 72 y.o. Date of Encounter: 07/31/2012, 12:54 PM    Chief Complaint:  Chief Complaint  Patient presents with  . Follow-up    1 week     HPI: 72 y.o. year old female here for f/u of recent respiratory infection. She saw Dr.Pickard 07/19/12--treated with nebulizer in office and treated with ZPack, Prednisone taper, albuterol.  Had f/u with me. At that time she was a lot better but "the previous night was the first time she didn't have shortness of breath when lying in bed."  At that time I prescribed QVar to use BID and planned f/u today. Today she says she feels that her breathing is back to baseline. Has no complaints.      Home Meds: See attached medication section for any medications that were entered at today's visit. The computer does not put those onto this list.The following list is a list of meds entered prior to today's visit.   Current Outpatient Prescriptions on File Prior to Visit  Medication Sig Dispense Refill  . albuterol (PROVENTIL HFA;VENTOLIN HFA) 108 (90 BASE) MCG/ACT inhaler Inhale 2 puffs into the lungs every 6 (six) hours as needed for wheezing.  1 Inhaler  0  . albuterol (PROVENTIL) (2.5 MG/3ML) 0.083% nebulizer solution Take 3 mLs (2.5 mg total) by nebulization every 6 (six) hours as needed for wheezing.  150 mL  1  . aspirin 325 MG tablet Take 325 mg by mouth daily. Takes 30 minutes prior to niacin      . azithromycin (ZITHROMAX) 250 MG tablet 2 pills on day 1, 1 pill on days 2-5  6 tablet  0  . beclomethasone (QVAR) 80 MCG/ACT inhaler Inhale 1 puff into the lungs as needed.  1 Inhaler  12  . Cholecalciferol (VITAMIN D) 2000 UNITS tablet Take 2,000 Units by mouth daily.      . Choline Fenofibrate 135 MG capsule Take 1 capsule (135 mg total) by mouth at bedtime.  30 capsule  5  . diphenhydrAMINE (BENADRYL) 25 MG tablet Take 25-50 mg by mouth as needed. allergies      . montelukast (SINGULAIR) 10 MG  tablet Take 1 tablet (10 mg total) by mouth at bedtime.  30 tablet  3  . Niacin-Simvastatin 1000-40 MG TB24 Take 1 tablet by mouth at bedtime.  30 tablet  5  . omeprazole (PRILOSEC OTC) 20 MG tablet Take 20 mg by mouth daily.        Marland Kitchen OVER THE COUNTER MEDICATION Take 1 capsule by mouth daily. Shiff Omega 3 krill oil, but not sure of strength      . OVER THE COUNTER MEDICATION Take 1 tablet by mouth daily. 1,000mg  of cinnamon and chromium tablet      . Phenylephrine-APAP-Guaifenesin (MUCINEX FAST-MAX COLD & SINUS PO) Take by mouth.      . predniSONE (DELTASONE) 20 MG tablet 3 tabs on days 1-2, 2 tabs on days 3-4, 1 tab on days 5-6  18 tablet  0  . pyridoxine (B-6) 200 MG tablet Take 200 mg by mouth daily.       No current facility-administered medications on file prior to visit.    Allergies: No Known Allergies    Review of Systems: See HPI for pertinent ROS. All other ROS negative.    Physical Exam: Blood pressure 130/70, pulse 64, temperature 97.8 F (36.6 C), temperature source Oral, resp. rate 20, height 5\' 4"  (1.626 m), weight 186 lb (  84.369 kg)., Body mass index is 31.91 kg/(m^2). General: WF.  Appears in no acute distress. HEENT: Normocephalic, atraumatic, eyes without discharge, sclera non-icteric, nares are without discharge. Bilateral auditory canals clear, TM's are without perforation, pearly grey and translucent with reflective cone of light bilaterally. Oral cavity moist, posterior pharynx without exudate, erythema, peritonsillar abscess, or post nasal drip.  Neck: Supple. No thyromegaly. Full ROM. No lymphadenopathy. Lungs: Clear bilaterally to auscultation without wheezes, rales, or rhonchi. Breathing is unlabored. Heart: Regular rhythm. No murmurs, rubs, or gallops. Msk:  Strength and tone normal for age. Extremities/Skin: Warm and dry. No clubbing or cyanosis. No edema. No rashes or suspicious lesions. Neuro: Alert and oriented X 3. Moves all extremities spontaneously.  Gait is normal. CNII-XII grossly in tact. Psych:  Responds to questions appropriately with a normal affect.     ASSESSMENT AND PLAN:  72 y.o. year old female with  1. Acute upper respiratory infections of unspecified site This has resolved. She can go off QVar. If symptoms recur, restart this BID and use albuterol prn. If has recurrent symptoms, f/u.   [address] [place], [place], [facility] 07/31/2012 12:54 PM

## 2012-10-13 IMAGING — CT CT HEAD W/O CM
4 of 5 series · 17 of 47 positions shown, 18 images · non-contrast
Comparison: none

CT HEAD

CLINICAL DATA: Fall.  Head injury.

CT HEAD WITHOUT CONTRAST
CT MAXILLOFACIAL WITHOUT CONTRAST
CT CERVICAL SPINE WITHOUT CONTRAST
TECHNIQUE: Multidetector CT imaging of the head, cervical spine,
and maxillofacial structures were performed using the standard
protocol without intravenous contrast. Multiplanar CT image
reconstructions of the cervical spine and maxillofacial structures
were also generated.

[Series 2: head 4.8 h37s · axial · 0.45mm/px · z∈[-132,-56]mm · 3 of 32 slices shown, 4 images]
[im 8/32  brain]
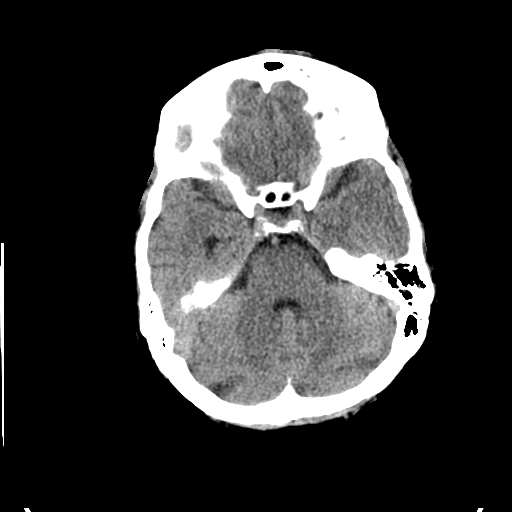
[im 8/32  bone]
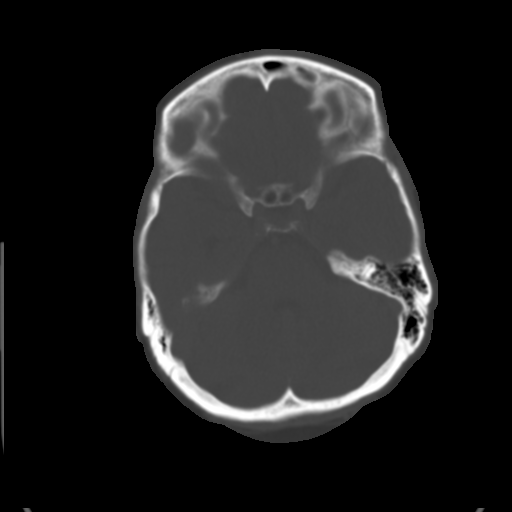
[im 16/32  brain]
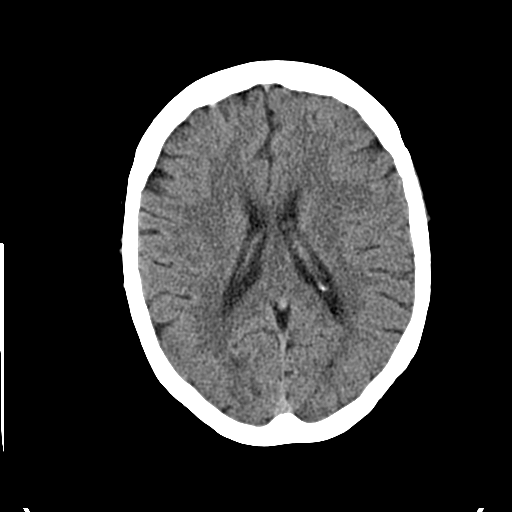
[im 24/32  brain]
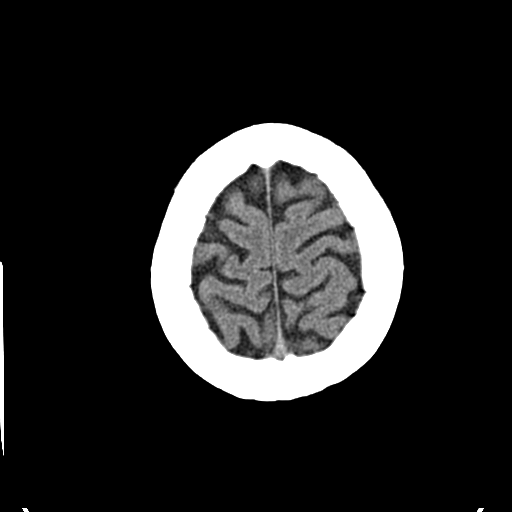

[Series 14: c_spine 2.0 coronal · coronal · 0.29mm/px · 3 of 55 slices shown]
[im 19/55  brain]
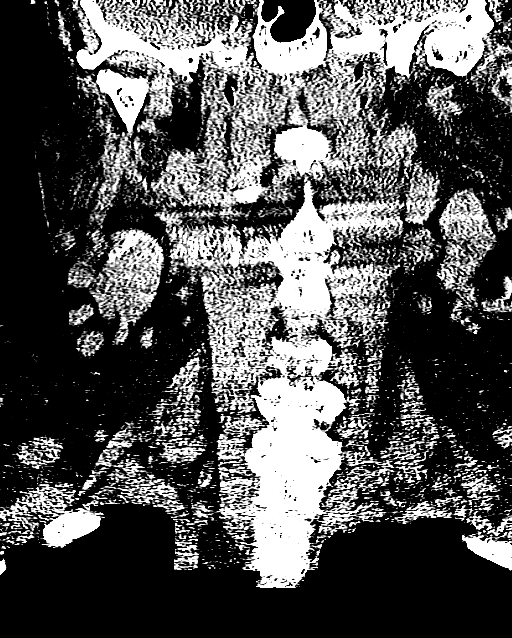
[im 25/55  brain]
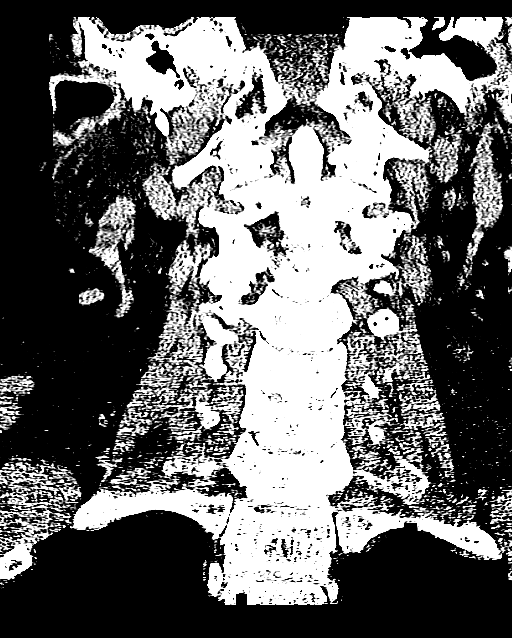
[im 31/55  brain]
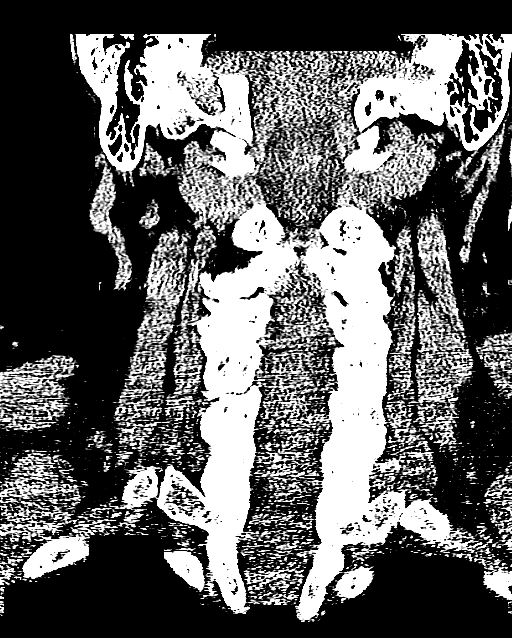

[Series 15: c_spine 2.0 sagittal · sagittal · 0.30mm/px · 3 of 70 slices shown]
[im 24/70  brain]
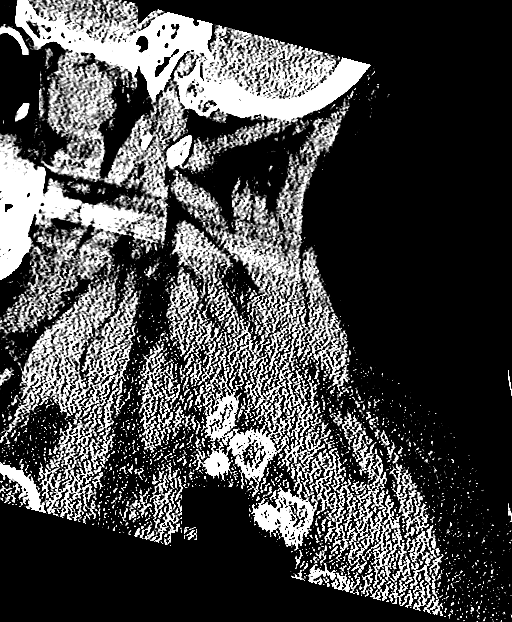
[im 35/70  brain]
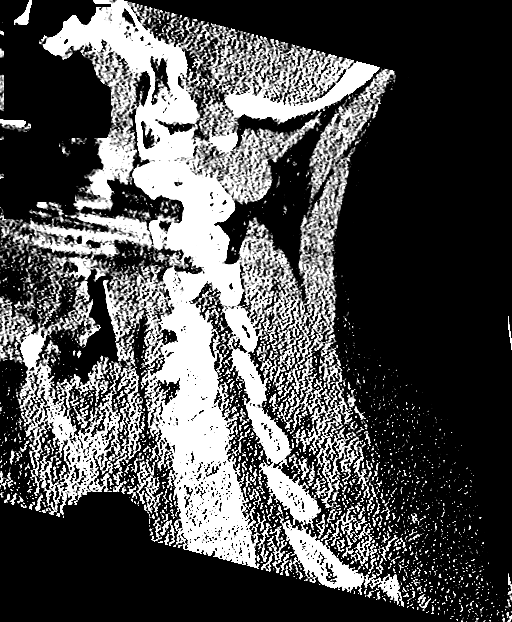
[im 47/70  brain]
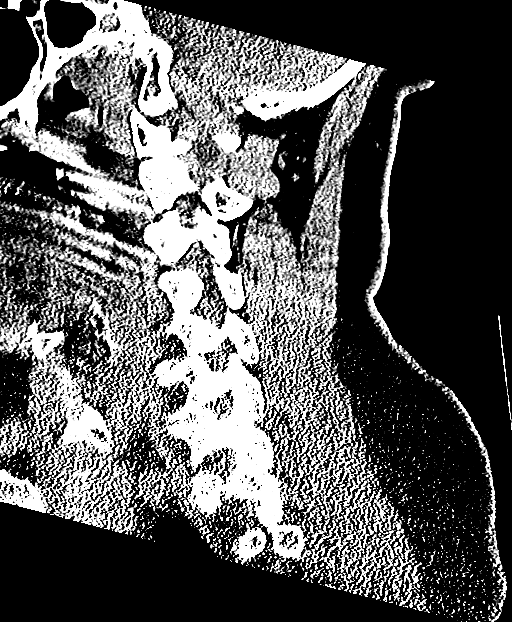

[Series 16: c_spine 2.0 orth ax · axial · 0.33mm/px · z∈[-339,-214]mm · 8 of 84 slices shown]
[im 7/84  brain]
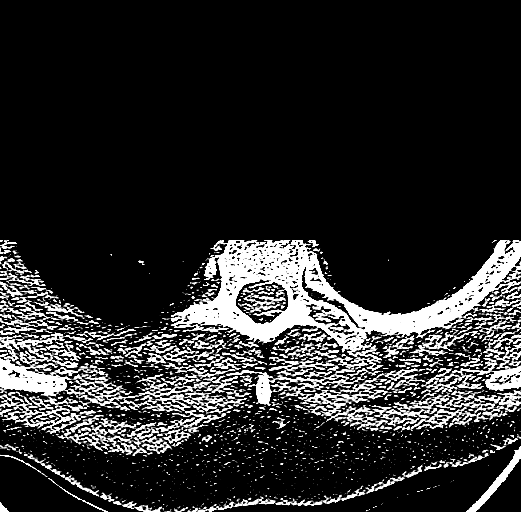
[im 21/84  brain]
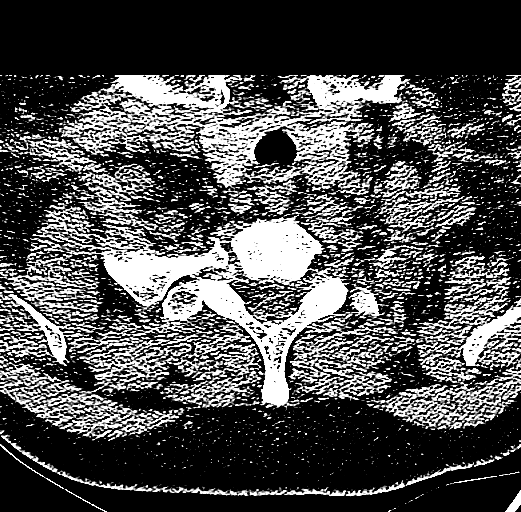
[im 28/84  brain]
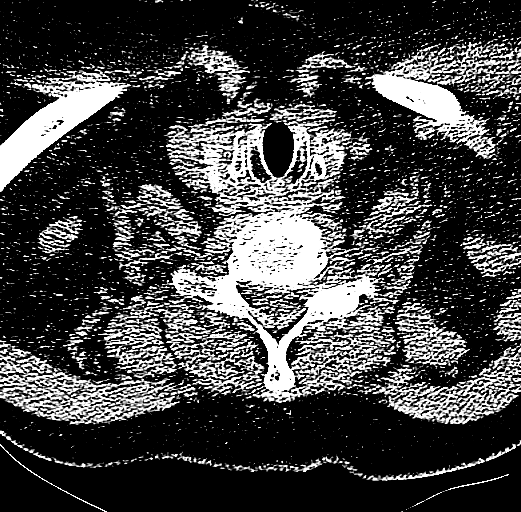
[im 35/84  brain]
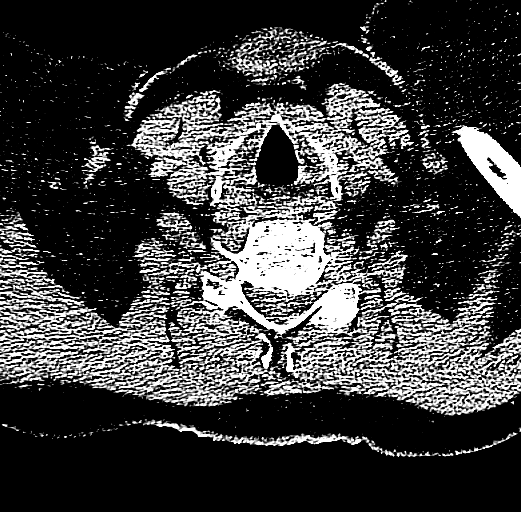
[im 49/84  brain]
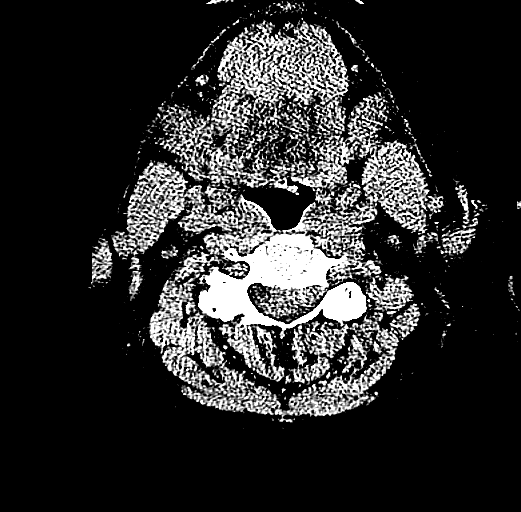
[im 56/84  brain]
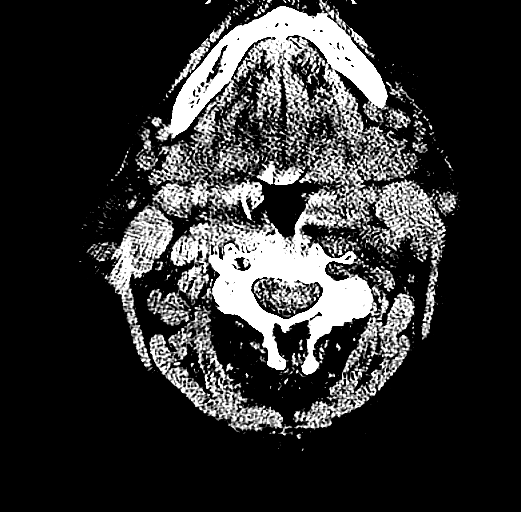
[im 63/84  brain]
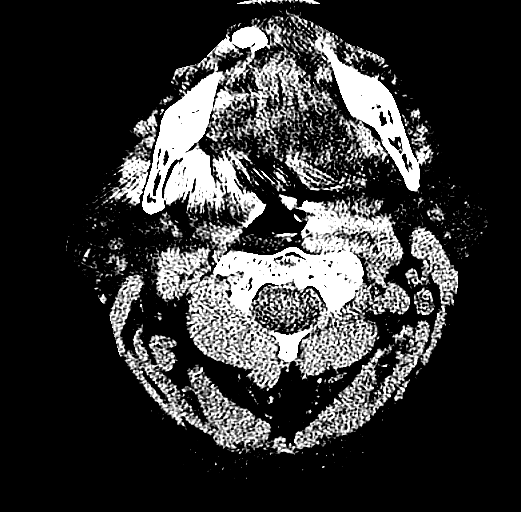
[im 77/84  brain]
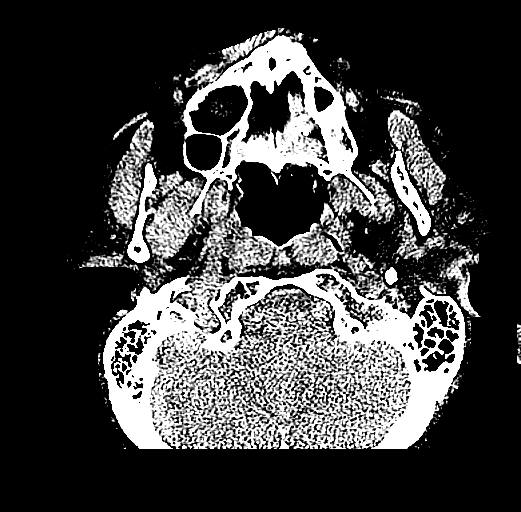

[17 of 47 positions shown; findings below may reference images not displayed]

FINDINGS: Ventricle size is normal.  Negative for hemorrhage.
Negative for acute infarct or mass.  Negative for skull fracture.
IMPRESSION: Negative for acute abnormality.

CT MAXILLOFACIAL
FINDINGS: Negative for facial fracture.  Nasal bone is intact.
Orbit is intact.  Negative for fracture of the mandible.  Mild
chronic sinusitis.  Small frontal scalp hematoma to the left of
midline just above the orbit.  No fluid in the sinuses.
IMPRESSION: Negative for facial fracture.

CT CERVICAL SPINE
FINDINGS: Negative for fracture.  Mild kyphosis of the cervical
spine.  Mild anterior slip C3 on C4.  Disc degeneration and
spondylosis C4-5, C5-6, C6-7.  Well circumscribed low density
lesion in the C7 vertebral body anteriorly.  This appears benign
and may represent hemangioma.
IMPRESSION: Negative for fracture.

## 2012-12-10 ENCOUNTER — Encounter: Payer: Self-pay | Admitting: Family Medicine

## 2013-01-09 ENCOUNTER — Telehealth: Payer: Self-pay | Admitting: Family Medicine

## 2013-01-09 DIAGNOSIS — J45909 Unspecified asthma, uncomplicated: Secondary | ICD-10-CM

## 2013-01-09 MED ORDER — ALBUTEROL SULFATE HFA 108 (90 BASE) MCG/ACT IN AERS: 2.0000 | INHALATION_SPRAY | Freq: Four times a day (QID) | RESPIRATORY_TRACT | Status: DC | PRN

## 2013-01-09 NOTE — Telephone Encounter (Signed)
Pro Air HFA 6mcg/INH  Inhaler  2 puffs q6hr PRN wheezing

## 2013-03-24 ENCOUNTER — Emergency Department (HOSPITAL_BASED_OUTPATIENT_CLINIC_OR_DEPARTMENT_OTHER)
Admission: EM | Admit: 2013-03-24 | Discharge: 2013-03-25 | Disposition: A | Discharge status: Home / Self Care | Payer: Medicare FFS | Attending: Emergency Medicine | Admitting: Emergency Medicine

## 2013-03-24 ENCOUNTER — Emergency Department (HOSPITAL_BASED_OUTPATIENT_CLINIC_OR_DEPARTMENT_OTHER): Payer: Medicare FFS

## 2013-03-24 ENCOUNTER — Encounter (HOSPITAL_BASED_OUTPATIENT_CLINIC_OR_DEPARTMENT_OTHER): Payer: Self-pay | Admitting: Emergency Medicine

## 2013-03-24 DIAGNOSIS — IMO0002 Reserved for concepts with insufficient information to code with codable children: Secondary | ICD-10-CM | POA: Insufficient documentation

## 2013-03-24 DIAGNOSIS — J302 Other seasonal allergic rhinitis: Secondary | ICD-10-CM

## 2013-03-24 DIAGNOSIS — E7800 Pure hypercholesterolemia, unspecified: Secondary | ICD-10-CM | POA: Insufficient documentation

## 2013-03-24 DIAGNOSIS — Z792 Long term (current) use of antibiotics: Secondary | ICD-10-CM | POA: Insufficient documentation

## 2013-03-24 DIAGNOSIS — J209 Acute bronchitis, unspecified: Secondary | ICD-10-CM | POA: Insufficient documentation

## 2013-03-24 DIAGNOSIS — K219 Gastro-esophageal reflux disease without esophagitis: Secondary | ICD-10-CM | POA: Insufficient documentation

## 2013-03-24 DIAGNOSIS — M1710 Unilateral primary osteoarthritis, unspecified knee: Secondary | ICD-10-CM | POA: Insufficient documentation

## 2013-03-24 DIAGNOSIS — Z853 Personal history of malignant neoplasm of breast: Secondary | ICD-10-CM | POA: Insufficient documentation

## 2013-03-24 DIAGNOSIS — Z79899 Other long term (current) drug therapy: Secondary | ICD-10-CM | POA: Insufficient documentation

## 2013-03-24 DIAGNOSIS — Z8669 Personal history of other diseases of the nervous system and sense organs: Secondary | ICD-10-CM | POA: Insufficient documentation

## 2013-03-24 DIAGNOSIS — Z7982 Long term (current) use of aspirin: Secondary | ICD-10-CM | POA: Insufficient documentation

## 2013-03-24 DIAGNOSIS — J40 Bronchitis, not specified as acute or chronic: Secondary | ICD-10-CM

## 2013-03-24 DIAGNOSIS — J45901 Unspecified asthma with (acute) exacerbation: Secondary | ICD-10-CM

## 2013-03-24 MED ORDER — HYDROCOD POLST-CHLORPHEN POLST 10-8 MG/5ML PO LQCR: 5.0000 mL | Freq: Two times a day (BID) | ORAL | Status: DC | PRN

## 2013-03-24 MED ORDER — HYDROCOD POLST-CHLORPHEN POLST 10-8 MG/5ML PO LQCR
5.0000 mL | Freq: Once | ORAL | Status: AC
  Administered 2013-03-25: 5 mL via ORAL
  Filled 2013-03-24: qty 5

## 2013-03-24 MED ORDER — DOXYCYCLINE HYCLATE 100 MG PO TABS
100.0000 mg | ORAL_TABLET | Freq: Once | ORAL | Status: AC
  Administered 2013-03-25: 100 mg via ORAL
  Filled 2013-03-24: qty 1

## 2013-03-24 MED ORDER — DOXYCYCLINE HYCLATE 100 MG PO CAPS: 100.0000 mg | ORAL_CAPSULE | Freq: Two times a day (BID) | ORAL | Status: DC

## 2013-03-24 MED ORDER — MONTELUKAST SODIUM 10 MG PO TABS: 10.0000 mg | ORAL_TABLET | Freq: Every day | ORAL | Status: DC

## 2013-03-24 NOTE — ED Notes (Signed)
MD at bedside. 

## 2013-03-24 NOTE — ED Notes (Signed)
C/o laryngitis, prod cough x 1 week

## 2013-03-24 NOTE — ED Notes (Signed)
Cough x 1 week    Production,   Congestion,
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
## 2013-03-25 NOTE — Patient Instructions (Signed)
Asked by physician to instruct patient with an aerochamber and her mdi. Instructed patient on the proper use of administering albuteral and Qvar via aerochamber patient understood

## 2013-07-25 ENCOUNTER — Emergency Department (HOSPITAL_COMMUNITY): Payer: Medicare FFS

## 2013-07-25 ENCOUNTER — Encounter (HOSPITAL_COMMUNITY): Payer: Self-pay | Admitting: Emergency Medicine

## 2013-07-25 ENCOUNTER — Emergency Department (HOSPITAL_COMMUNITY)
Admission: EM | Admit: 2013-07-25 | Discharge: 2013-07-25 | Disposition: A | Discharge status: Home / Self Care | Payer: Medicare FFS | Attending: Emergency Medicine | Admitting: Emergency Medicine

## 2013-07-25 DIAGNOSIS — Z7982 Long term (current) use of aspirin: Secondary | ICD-10-CM | POA: Insufficient documentation

## 2013-07-25 DIAGNOSIS — Z859 Personal history of malignant neoplasm, unspecified: Secondary | ICD-10-CM | POA: Insufficient documentation

## 2013-07-25 DIAGNOSIS — E559 Vitamin D deficiency, unspecified: Secondary | ICD-10-CM | POA: Insufficient documentation

## 2013-07-25 DIAGNOSIS — Z8669 Personal history of other diseases of the nervous system and sense organs: Secondary | ICD-10-CM | POA: Insufficient documentation

## 2013-07-25 DIAGNOSIS — Y9301 Activity, walking, marching and hiking: Secondary | ICD-10-CM | POA: Insufficient documentation

## 2013-07-25 DIAGNOSIS — W010XXA Fall on same level from slipping, tripping and stumbling without subsequent striking against object, initial encounter: Secondary | ICD-10-CM | POA: Insufficient documentation

## 2013-07-25 DIAGNOSIS — Z79899 Other long term (current) drug therapy: Secondary | ICD-10-CM | POA: Insufficient documentation

## 2013-07-25 DIAGNOSIS — Y9229 Other specified public building as the place of occurrence of the external cause: Secondary | ICD-10-CM | POA: Insufficient documentation

## 2013-07-25 DIAGNOSIS — E7800 Pure hypercholesterolemia, unspecified: Secondary | ICD-10-CM | POA: Insufficient documentation

## 2013-07-25 DIAGNOSIS — M1710 Unilateral primary osteoarthritis, unspecified knee: Secondary | ICD-10-CM | POA: Insufficient documentation

## 2013-07-25 DIAGNOSIS — IMO0002 Reserved for concepts with insufficient information to code with codable children: Secondary | ICD-10-CM | POA: Insufficient documentation

## 2013-07-25 DIAGNOSIS — M2390 Unspecified internal derangement of unspecified knee: Secondary | ICD-10-CM

## 2013-07-25 DIAGNOSIS — K219 Gastro-esophageal reflux disease without esophagitis: Secondary | ICD-10-CM | POA: Insufficient documentation

## 2013-07-25 MED ORDER — ONDANSETRON HCL 4 MG/2ML IJ SOLN
4.0000 mg | Freq: Once | INTRAMUSCULAR | Status: AC
  Administered 2013-07-25: 4 mg via INTRAVENOUS
  Filled 2013-07-25: qty 2

## 2013-07-25 MED ORDER — OXYCODONE-ACETAMINOPHEN 5-325 MG PO TABS: 1.0000 | ORAL_TABLET | Freq: Four times a day (QID) | ORAL | Status: DC | PRN

## 2013-07-25 MED ORDER — MORPHINE SULFATE 4 MG/ML IJ SOLN
4.0000 mg | Freq: Once | INTRAMUSCULAR | Status: AC
  Administered 2013-07-25: 4 mg via INTRAVENOUS
  Filled 2013-07-25: qty 1

## 2013-07-25 NOTE — ED Notes (Signed)
Assisted pt to bathroom per wheelchair.
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
## 2013-07-25 NOTE — Discharge Instructions (Signed)
Return here as needed. Follow up with your orthopedist. °

## 2013-07-25 NOTE — ED Notes (Addendum)
Slipped and fell on concrete floor injurying right leg and ankle. Pain from left posterior thigh to right ankle. Some swelling noted.

## 2013-07-25 NOTE — ED Notes (Signed)
Offered pt to move to treatment room chair so we could elevate the leg but pt declined. Wanted to "keep it bent".

## 2013-07-25 NOTE — ED Provider Notes (Signed)
CSN: [id number]     Arrival date & time 07/25/13  0900 History  This chart was scribed for non-physician practitioner, Irena Cords working with Mervin Kung, MD by Einar Pheasant, ED scribe. This patient was seen in room TR08C/TR08C and the patient's care was started at 9:44 AM.    Chief Complaint  Patient presents with  . Knee Injury   The history is provided by the patient. No language interpreter was used.   HPI Comments: Jennifer Wu is a 73 y.o. female who presents to the Emergency Department complaining of an injury that occurred approximately 1 hour ago. 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 She is currently complaining of associated right knee pain. She describes the pain as constant and throbbing. Pt states that the pain is worsened by movement, but especially a standing and sitting motion. Pt has a history of a right meniscus and left elbow surgical procedure. She states that the pain radiates down her right leg. She also reports hitting her left elbow but she denies any pain right now. Denies any fever, chills, diaphoresis, nausea, emesis, cough, SOB, numbness, tingling, chest pain, or abdominal pain.   Past Medical History  Diagnosis Date  . High cholesterol   . 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 Cancer 2000-rt lumpectomy-no bp rt arm  . GERD (gastroesophageal reflux disease)   . Seasonal allergies   . Arthritis     knees - otc med prn  . Mixed dyslipidemia   . Vitamin D deficiency    Past Surgical History  Procedure Laterality Date  . Knee surgery  10/12    right knee  . Breast surgery    . Lymph node biopsy      right breast  . Colonoscopy    . Lumbar laminectomy  1991  . Back surgery    . Fracture surgery  rt foot-1990  . Radial head implant  02/01/2011    Procedure: RADIAL HEAD IMPLANT;   Surgeon: Schuyler Amor, MD;  Location: [facility];  Service: Orthopedics;  Laterality: Left;  left radial head replacement  . Dilation and curettage of uterus  1976    MAB  . Svd      x 3  . Hysteroscopy w/d&c  06/09/2011    Procedure: DILATATION AND CURETTAGE /HYSTEROSCOPY;  Surgeon: Cheri Fowler, MD;  Location: [facility];  Service: Gynecology;  Laterality: N/A;  . 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 Elbow surgery     Family History  Problem Relation Age of Onset  . Heart disease Mother     MI's  . CAD Mother   . Diabetes Mother   . Alzheimer's disease Father   . Alcohol abuse Father     cirrhosis  . CAD Brother   . Diabetes Brother   . Heart disease Brother     MI's   History  Substance Use Topics  . Smoking status: Never Smoker   . Smokeless tobacco: Never Used  . Alcohol Use: No   OB History   Grav Para Term Preterm Abortions TAB SAB Ect Mult Living                 Review of Systems A complete 10 system review of systems was  obtained and all systems are negative except as noted in the HPI and PMH.     Allergies  Review of patient's allergies indicates no known allergies.  Home Medications   Prior to Admission medications   Medication Sig Start Date End Date Taking? Authorizing Provider  albuterol (PROVENTIL HFA;VENTOLIN HFA) 108 (90 BASE) MCG/ACT inhaler Inhale 2 puffs into the lungs every 6 (six) hours as needed for wheezing. 01/09/13   Lonie Peak Dixon, PA-C  albuterol (PROVENTIL) (2.5 MG/3ML) 0.083% nebulizer solution Take 3 mLs (2.5 mg total) by nebulization every 6 (six) hours as needed for wheezing. 07/19/12   [name], MD  aspirin 325 MG tablet Take 325 mg by mouth daily. Takes 30 minutes prior to niacin    Historical Provider, MD  azithromycin (ZITHROMAX) 250 MG tablet 2 pills on day 1, 1 pill on days 2-5 07/19/12   [name], MD  beclomethasone (QVAR) 80 MCG/ACT inhaler Inhale 1 puff into the  lungs as needed. 07/24/12   Orlena Sheldon, PA-C  chlorpheniramine-HYDROcodone (TUSSIONEX PENNKINETIC ER) 10-8 MG/5ML LQCR Take 5 mLs by mouth every 12 (twelve) hours as needed for cough. 03/24/13   Karen Chafe Molpus, MD  Cholecalciferol (VITAMIN D) 2000 UNITS tablet Take 2,000 Units by mouth daily.    Historical Provider, MD  Choline Fenofibrate 135 MG capsule Take 1 capsule (135 mg total) by mouth at bedtime. 07/24/12   Lonie Peak Dixon, PA-C  diphenhydrAMINE (BENADRYL) 25 MG tablet Take 25-50 mg by mouth as needed. allergies    Historical Provider, MD  doxycycline (VIBRAMYCIN) 100 MG capsule Take 1 capsule (100 mg total) by mouth 2 (two) times daily. One po bid x 7 days 03/24/13   Karen Chafe Molpus, MD  montelukast (SINGULAIR) 10 MG tablet Take 1 tablet (10 mg total) by mouth at bedtime. 03/24/13   John L Molpus, MD  Niacin-Simvastatin 1000-40 MG TB24 Take 1 tablet by mouth at bedtime. 07/24/12   Lonie Peak Dixon, PA-C  omeprazole (PRILOSEC OTC) 20 MG tablet Take 20 mg by mouth daily.      Historical Provider, MD  OVER THE COUNTER MEDICATION Take 1 capsule by mouth daily. Shiff Omega 3 krill oil, but not sure of strength    Historical Provider, MD  OVER THE COUNTER MEDICATION Take 1 tablet by mouth daily. 1,000mg  of cinnamon and chromium tablet    Historical Provider, MD  Phenylephrine-APAP-Guaifenesin ([place] FAST-MAX COLD & SINUS PO) Take by mouth.    Historical Provider, MD  predniSONE (DELTASONE) 20 MG tablet 3 tabs on days 1-2, 2 tabs on days 3-4, 1 tab on days 5-6 07/19/12   [name], MD  pyridoxine (B-6) 200 MG tablet Take 200 mg by mouth daily.    Historical Provider, MD   Triage Vitals: BP 178/95  Pulse 87  Temp(Src) 98.2 F (36.8 C) (Oral)  SpO2 99%  Physical Exam  Nursing note and vitals reviewed. Constitutional: She is oriented to person, place, and time. She appears well-developed and well-nourished. No distress.  HENT:  Head: Normocephalic and atraumatic.  Eyes: EOM are normal.   Neck: Neck supple.  Cardiovascular: Normal rate.   Pulmonary/Chest: Effort normal.  Musculoskeletal:       Right knee: She exhibits decreased range of motion, swelling, effusion and ecchymosis. She exhibits no deformity, no laceration, no erythema, normal alignment, no LCL laxity, normal patellar mobility, normal meniscus and no MCL laxity. Tenderness found.  Neurological: She is alert and oriented to person, place, and  time.  Skin: Skin is warm and dry.  Psychiatric: She has a normal mood and affect. Her behavior is normal.    ED Course  Procedures (including critical care time)  DIAGNOSTIC STUDIES: Oxygen Saturation is 99% on RA, normal by my interpretation.    COORDINATION OF CARE: 9:46 AM-Patient / Family / Caregiver informed of clinical course, understand medical decision-making process, and agree with plan.  Medications  morphine 4 MG/ML injection 4 mg (not administered)    Imaging Review Dg Ankle Complete Right  07/25/2013   CLINICAL DATA:  Injury, fell was morning, dorsal and medial RIGHT ankle pain  EXAM: RIGHT ANKLE - COMPLETE 3+ VIEW  COMPARISON:  None  FINDINGS: Osseous demineralization.  Ankle mortise intact.  Accessory ossicle at medial foot.  No acute fracture, dislocation, or bone destruction.  Mild medial soft tissue swelling.  Small to moderate-sized plantar calcaneal spur.  IMPRESSION: No acute osseous abnormalities.  Calcaneal spurring.   Electronically Signed   By: Lavonia Dana M.D.   On: 07/25/2013 09:42   Ct Knee Right Wo Contrast  07/25/2013   CLINICAL DATA:  [place]. Injured knee. Possible medial tibial plateau fracture on radiographs.  EXAM: CT OF THE RIGHT KNEE WITHOUT CONTRAST  TECHNIQUE: Multidetector CT imaging was performed according to the standard protocol. Multiplanar CT image reconstructions were also generated.  COMPARISON:  Radiographs 07/25/2013  FINDINGS: No acute fracture is identified. Specifically, no medial tibial plateau fracture. There are medial  compartment degenerative changes and subchondral cysts involving the medial tibial plateau. The patella and fibula are intact.  There is a moderate to large joint effusion and could not exclude internal derangement. The surrounding knee musculature is grossly normal. The quadriceps and patellar tendons are intact.  IMPRESSION: No acute fracture is identified.  Moderate to large joint effusion. Could not exclude internal derangement.   Electronically Signed   By: Kalman Jewels M.D.   On: 07/25/2013 11:47   Dg Knee Complete 4 Views Right  07/25/2013   CLINICAL DATA:  Injury  EXAM: RIGHT KNEE - COMPLETE 4+ VIEW  COMPARISON:  None.  FINDINGS: The medial tibial plateau is irregular in appearance with overlapping sclerotic lines. Depressed fracture is not excluded. Moderate joint effusion is present.  IMPRESSION: Depressed medial tibial plateau fracture is not excluded. CT can be performed to further characterize.   Electronically Signed   By: Maryclare Bean M.D.   On: 07/25/2013 09:41    Patient will be referred to her orthopedic doctor.  Patient is advised to ice and elevate the knee placed in a knee immobilizer.  Patient is advised to return here for any worsening in her condition.   I personally performed the services described in this documentation, which was scribed in my presence. The recorded information has been reviewed and is accurate.     Brent General, PA-C 07/25/13 1257

## 2013-08-28 ENCOUNTER — Other Ambulatory Visit (HOSPITAL_COMMUNITY): Payer: Self-pay | Admitting: Cardiology

## 2013-08-28 ENCOUNTER — Ambulatory Visit (HOSPITAL_COMMUNITY): Payer: Medicare FFS | Attending: Cardiovascular Disease | Admitting: Cardiology

## 2013-08-28 DIAGNOSIS — M7989 Other specified soft tissue disorders: Secondary | ICD-10-CM

## 2013-08-28 DIAGNOSIS — M79609 Pain in unspecified limb: Secondary | ICD-10-CM

## 2013-08-28 DIAGNOSIS — E785 Hyperlipidemia, unspecified: Secondary | ICD-10-CM | POA: Insufficient documentation

## 2013-08-28 DIAGNOSIS — I10 Essential (primary) hypertension: Secondary | ICD-10-CM | POA: Insufficient documentation

## 2013-08-28 NOTE — Progress Notes (Signed)
Lower venous duplex unilateral completed. 

## 2014-02-04 LAB — HM MAMMOGRAPHY: HM Mammogram: NORMAL

## 2014-02-05 ENCOUNTER — Encounter: Payer: Self-pay | Admitting: Family Medicine

## 2015-02-10 LAB — HM MAMMOGRAPHY

## 2015-02-16 ENCOUNTER — Encounter: Payer: Self-pay | Admitting: Family Medicine

## 2015-06-07 MED FILL — HYDROCODON-APAP 5-325: 5-325 | 8 days supply | Qty: 60 | Fill #0

## 2015-06-18 ENCOUNTER — Other Ambulatory Visit (HOSPITAL_COMMUNITY): Payer: Self-pay | Admitting: Orthopedic Surgery

## 2015-06-18 ENCOUNTER — Ambulatory Visit (HOSPITAL_COMMUNITY)
Admission: RE | Admit: 2015-06-18 | Discharge: 2015-06-18 | Disposition: A | Discharge status: Home / Self Care | Payer: Medicare Other | Source: Ambulatory Visit | Attending: Cardiology | Admitting: Cardiology

## 2015-06-18 DIAGNOSIS — K219 Gastro-esophageal reflux disease without esophagitis: Secondary | ICD-10-CM | POA: Diagnosis not present

## 2015-06-18 DIAGNOSIS — E782 Mixed hyperlipidemia: Secondary | ICD-10-CM | POA: Diagnosis not present

## 2015-06-18 DIAGNOSIS — M79605 Pain in left leg: Secondary | ICD-10-CM | POA: Diagnosis not present

## 2015-06-18 DIAGNOSIS — E7800 Pure hypercholesterolemia, unspecified: Secondary | ICD-10-CM | POA: Insufficient documentation

## 2015-06-22 MED FILL — predniSONE 10 MG (48) TBPK: 10 | 12 days supply | Qty: 48 | Fill #0

## 2015-06-22 MED FILL — OXYCODONE/APAP 5-325: 5-325 | 7 days supply | Qty: 40 | Fill #0

## 2016-02-11 LAB — HM MAMMOGRAPHY

## 2016-02-15 ENCOUNTER — Encounter: Payer: Self-pay | Admitting: Family Medicine

## 2017-02-12 LAB — HM MAMMOGRAPHY

## 2017-02-13 ENCOUNTER — Encounter: Payer: Self-pay | Admitting: Family Medicine

## 2017-03-30 ENCOUNTER — Other Ambulatory Visit: Payer: Self-pay

## 2017-03-30 ENCOUNTER — Ambulatory Visit (HOSPITAL_COMMUNITY)
Admission: EM | Admit: 2017-03-30 | Discharge: 2017-03-30 | Disposition: A | Discharge status: Home / Self Care | Payer: Medicare Other | Source: Home / Self Care | Attending: Family Medicine | Admitting: Family Medicine

## 2017-03-30 ENCOUNTER — Emergency Department (HOSPITAL_COMMUNITY)
Admission: EM | Admit: 2017-03-30 | Discharge: 2017-03-30 | Disposition: A | Discharge status: Home / Self Care | Payer: Medicare Other | Attending: Emergency Medicine | Admitting: Emergency Medicine

## 2017-03-30 ENCOUNTER — Encounter (HOSPITAL_COMMUNITY): Payer: Self-pay

## 2017-03-30 DIAGNOSIS — I10 Essential (primary) hypertension: Secondary | ICD-10-CM | POA: Insufficient documentation

## 2017-03-30 DIAGNOSIS — Z79899 Other long term (current) drug therapy: Secondary | ICD-10-CM | POA: Insufficient documentation

## 2017-03-30 DIAGNOSIS — I160 Hypertensive urgency: Secondary | ICD-10-CM

## 2017-03-30 DIAGNOSIS — J45909 Unspecified asthma, uncomplicated: Secondary | ICD-10-CM | POA: Diagnosis not present

## 2017-03-30 DIAGNOSIS — Z859 Personal history of malignant neoplasm, unspecified: Secondary | ICD-10-CM | POA: Insufficient documentation

## 2017-03-30 DIAGNOSIS — Z7982 Long term (current) use of aspirin: Secondary | ICD-10-CM | POA: Insufficient documentation

## 2017-03-30 LAB — I-STAT CHEM 8, ED
BUN: 13 mg/dL (ref 6–20)
Calcium, Ion: 1.19 mmol/L (ref 1.15–1.40)
Chloride: 101 mmol/L (ref 101–111)
Creatinine, Ser: 0.6 mg/dL (ref 0.44–1.00)
Glucose, Bld: 88 mg/dL (ref 65–99)
HCT: 44 % (ref 36.0–46.0)
Hemoglobin: 15 g/dL (ref 12.0–15.0)
Potassium: 3.1 mmol/L — ABNORMAL LOW (ref 3.5–5.1)
Sodium: 142 mmol/L (ref 135–145)
TCO2: 27 mmol/L (ref 22–32)

## 2017-03-30 MED ORDER — AMLODIPINE BESYLATE 5 MG PO TABS
5.0000 mg | ORAL_TABLET | Freq: Once | ORAL | Status: AC
  Administered 2017-03-30: 5 mg via ORAL
  Filled 2017-03-30: qty 1

## 2017-03-30 MED ORDER — POTASSIUM CHLORIDE CRYS ER 20 MEQ PO TBCR
40.0000 meq | EXTENDED_RELEASE_TABLET | Freq: Once | ORAL | Status: AC
  Administered 2017-03-30: 40 meq via ORAL
  Filled 2017-03-30: qty 2

## 2017-03-30 MED ORDER — AMLODIPINE BESYLATE 5 MG PO TABS: 5.0000 mg | ORAL_TABLET | Freq: Every day | ORAL | 0 refills | Status: DC

## 2017-03-30 MED ORDER — DILTIAZEM HCL 60 MG PO TABS
60.0000 mg | ORAL_TABLET | Freq: Once | ORAL | Status: DC
  Filled 2017-03-30: qty 1

## 2017-03-30 MED FILL — AMLODIPINE BESYLATE 5 MG TA: 5 | 30 days supply | Qty: 30 | Fill #0

## 2017-03-30 NOTE — ED Provider Notes (Signed)
[facility]    CSN: [id number] Arrival date & time: 03/30/17  1007     History   Chief Complaint Chief Complaint  Patient presents with  . Hypertension    HPI Jennifer Wu is a 77 y.o. female.   77 yo female without PMH significant for HTN here for elevated blood pressure prior to colonoscopy yesterday. It was 200/100s. She was given labetelol IV, unknown dose, and proceeded with procedure. Patient was advised to follow up and could not get appointment with pcp so came here for evaluation. She denies headache, chest pain, vision changes. She does admit to a dull ache in the back of her neck that comes and goes over the past few weeks. Denies muscle weakness or loss of balance.       Past Medical History:  Diagnosis Date  . Arthritis    knees - otc med prn  . Cancer 2000-rt lumpectomy-no bp rt arm  . GERD (gastroesophageal reflux disease)   . High cholesterol   . Mixed dyslipidemia   . 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 Seasonal allergies   . Vitamin D deficiency     Patient Active Problem List   Diagnosis Date Noted  . Asthma with acute exacerbation 07/24/2012  . Vitamin D deficiency   . Seasonal allergies 07/19/2012  . Mixed dyslipidemia     Past Surgical History:  Procedure Laterality Date  . BACK SURGERY    . BREAST SURGERY    . COLONOSCOPY    . [place]   MAB  . 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 ELBOW SURGERY    . FRACTURE SURGERY  rt foot-1990  . HYSTEROSCOPY W/D&C  06/09/2011   Procedure: DILATATION AND CURETTAGE /HYSTEROSCOPY;  Surgeon: Cheri Fowler, MD;  Location: [facility];  Service: Gynecology;  Laterality: N/A;  . KNEE SURGERY  10/12   right knee  . LUMBAR LAMINECTOMY  1991  . LYMPH NODE BIOPSY     right breast  . RADIAL HEAD IMPLANT  02/01/2011   Procedure: RADIAL HEAD IMPLANT;   Surgeon: Schuyler Amor, MD;  Location: [facility];  Service: Orthopedics;  Laterality: Left;  left radial head replacement  . SVD     x 3    OB History    No data available       Home Medications    Prior to Admission medications   Medication Sig Start Date End Date Taking? Authorizing Provider  Cholecalciferol (VITAMIN D) 2000 UNITS tablet Take 2,000 Units by mouth daily.   Yes [provider]  omeprazole (PRILOSEC OTC) 20 MG tablet Take 20 mg by mouth daily.     Yes [provider]  albuterol (PROVENTIL HFA;VENTOLIN HFA) 108 (90 BASE) MCG/ACT inhaler Inhale 2 puffs into the lungs every 6 (six) hours as needed for wheezing. 01/09/13   Orlena Sheldon, PA-C  aspirin 325 MG tablet Take 325 mg by mouth at bedtime. Takes 30 minutes prior to niacin    [provider]  beclomethasone (QVAR) 80 MCG/ACT inhaler Inhale 1 puff into the lungs as needed. 07/24/12   Orlena Sheldon, PA-C  cetirizine (ZYRTEC) 10 MG tablet Take 10 mg by mouth daily.    [provider]  Choline Fenofibrate 135 MG capsule Take 1 capsule (135 mg total) by mouth at  bedtime. 07/24/12   Orlena Sheldon, PA-C  ibuprofen (ADVIL,MOTRIN) 200 MG tablet Take 400-600 mg by mouth every 6 (six) hours as needed for headache or moderate pain.    [provider]  montelukast (SINGULAIR) 10 MG tablet Take 1 tablet (10 mg total) by mouth at bedtime. 03/24/13   Molpus, John, MD  Multiple Vitamin (MULTIVITAMIN WITH MINERALS) TABS tablet Take 1 tablet by mouth daily.    [provider]  Niacin-Simvastatin 1000-40 MG TB24 Take 1 tablet by mouth at bedtime. 07/24/12   Dixon, Stanton Kidney B, PA-C  OVER THE COUNTER MEDICATION Take 1 capsule by mouth daily. Shiff Omega 3 krill oil, but not sure of strength    [provider]  oxyCODONE-acetaminophen (PERCOCET/ROXICET) 5-325 MG per tablet Take 1 tablet by mouth every 6 (six) hours as needed for severe pain. 07/25/13   Dalia Heading, PA-C    Family History Family History  Problem Relation Age of Onset  . Heart disease Mother        MI's  . CAD Mother   . Diabetes Mother   . Alzheimer's disease Father   . Alcohol abuse Father        cirrhosis  . CAD Brother   . Diabetes Brother   . Heart disease Brother        MI's    Social History Social History   Tobacco Use  . Smoking status: Never Smoker  . Smokeless tobacco: Never Used  Substance Use Topics  . Alcohol use: No  . Drug use: No     Allergies   Patient has no known allergies.   Review of Systems Review of Systems  Constitutional: Negative for activity change and fever.  HENT: Negative for congestion and ear discharge.   Eyes: Negative for discharge and itching.  Respiratory: Negative for apnea, chest tightness and shortness of breath.   Cardiovascular: Negative for chest pain and palpitations.  Gastrointestinal: Negative for abdominal distention and abdominal pain.  Endocrine: Negative for cold intolerance and heat intolerance.  Musculoskeletal: Positive for neck pain. Negative for arthralgias.  Neurological: Negative for dizziness, light-headedness, numbness and headaches.     Physical Exam Triage Vital Signs ED Triage Vitals [03/30/17 1045]  Enc Vitals Group     BP (!) 191/118     Pulse Rate 80     Resp      Temp 98.3 F (36.8 C)     Temp Source Oral     SpO2 97 %     Weight      Height      Head Circumference      Peak Flow      Pain Score 0     Pain Loc      Pain Edu?      Excl. in [place]?    No data found.  Updated Vital Signs BP (!) 191/118 (BP Location: Left Arm)   Pulse 80   Temp 98.3 F (36.8 C) (Oral)   SpO2 97%   Visual Acuity Right Eye Distance:   Left Eye Distance:   Bilateral Distance:    Right Eye Near:   Left Eye Near:    Bilateral Near:     Physical Exam  Constitutional: She is oriented to person, place, and time. She appears well-developed and well-nourished. No distress.  HENT:    Head: Normocephalic and atraumatic.  Eyes: EOM are normal. Pupils are equal, round, and reactive to light.  Neck: Normal range of motion. Neck supple.  Cardiovascular: Normal rate, regular rhythm and normal heart sounds.  No murmur heard. Pulmonary/Chest: Effort normal and breath sounds normal. No respiratory distress. She has no wheezes. She has no rales.  Musculoskeletal: Normal range of motion. She exhibits no edema.  Neurological: She is alert and oriented to person, place, and time.  Skin: Skin is warm and dry.  Psychiatric: She has a normal mood and affect. Her behavior is normal.     UC Treatments / Results  Labs (all labs ordered are listed, but only abnormal results are displayed) Labs Reviewed - No data to display  EKG  EKG Interpretation None       Radiology No results found.  Procedures Procedures (including critical care time)  Medications Ordered in UC Medications - No data to display   Initial Impression / Assessment and Plan / UC Course  I have reviewed the triage vital signs and the nursing notes.  Pertinent labs & imaging results that were available during my care of the patient were reviewed by me and considered in my medical decision making (see chart for details).    Offered to evaluate patient with EKG and chem 8 for urgency. Advised patient that she would still need to go to ED for further evaluation and likely need more IV anti-hypertensive medication. She declines testing at this time and wishes to go directly to ED. I am ok with this.   Final Clinical Impressions(s) / UC Diagnoses   Final diagnoses:  Hypertensive urgency    ED Discharge Orders    None       Controlled Substance Prescriptions [place] Controlled Substance Registry consulted? Not Applicable   Dannielle Huh, DO 03/30/17 1122

## 2017-03-30 NOTE — ED Triage Notes (Signed)
Pt states she had colonoscopy yesterday and was told her bp was 200/100. She had 3mg  of lopressor and propofol for her procedure. Pt denies any headaches, dizziness, or blurred vision.

## 2017-03-30 NOTE — ED Notes (Signed)
Pt unable to sign due to pad being broken.  Pt has discharge papers and verbalized understanding discharge instructions

## 2017-03-30 NOTE — Discharge Instructions (Signed)
Please recheck with a primary care doctor in the next week.

## 2017-03-30 NOTE — ED Provider Notes (Signed)
[facility] EMERGENCY DEPARTMENT Provider Note   CSN: [id number] Arrival date & time: 03/30/17  1124     History   Chief Complaint Chief Complaint  Patient presents with  . Hypertension    HPI Jennifer Wu is a 77 y.o. female.  HPI   77 year old female presents today with reports that she has been told her blood pressure is high.  She was seen yesterday and had a colonoscopy.  She reports that during the colonoscopy she was told she was given 3 mg of Lopressor with her blood pressure was 200.  She was told to follow up and went to urgent care today.  There her systolic blood pressure was again over 200.  She denies any symptoms.  She denies headache, chest pain, visual changes, or other complaints.  She states that in the past she has had blood pressure that was elevated when she saw Dr. and generally came down.  Not previously taken any blood pressure medications.  Past Medical History:  Diagnosis Date  . Arthritis    knees - otc med prn  . Cancer ([place]) 2000-rt lumpectomy-no bp rt arm  . GERD (gastroesophageal reflux disease)   . High cholesterol   . Mixed dyslipidemia   . 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 Seasonal allergies   . Vitamin D deficiency     Patient Active Problem List   Diagnosis Date Noted  . Asthma with acute exacerbation 07/24/2012  . Vitamin D deficiency   . Seasonal allergies 07/19/2012  . Mixed dyslipidemia     Past Surgical History:  Procedure Laterality Date  . BACK SURGERY    . BREAST SURGERY    . COLONOSCOPY    . [place]   MAB  . 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 ELBOW SURGERY    . FRACTURE SURGERY  rt foot-1990  . HYSTEROSCOPY W/D&C  06/09/2011   Procedure: DILATATION AND CURETTAGE /HYSTEROSCOPY;  Surgeon: Cheri Fowler, MD;  Location: [facility];  Service:  Gynecology;  Laterality: N/A;  . KNEE SURGERY  10/12   right knee  . LUMBAR LAMINECTOMY  1991  . LYMPH NODE BIOPSY     right breast  . RADIAL HEAD IMPLANT  02/01/2011   Procedure: RADIAL HEAD IMPLANT;  Surgeon: Schuyler Amor, MD;  Location: [facility];  Service: Orthopedics;  Laterality: Left;  left radial head replacement  . SVD     x 3    OB History    No data available       Home Medications    Prior to Admission medications   Medication Sig Start Date End Date Taking? Authorizing Provider  albuterol (PROVENTIL HFA;VENTOLIN HFA) 108 (90 BASE) MCG/ACT inhaler Inhale 2 puffs into the lungs every 6 (six) hours as needed for wheezing. 01/09/13   Orlena Sheldon, PA-C  aspirin 325 MG tablet Take 325 mg by mouth at bedtime. Takes 30 minutes prior to niacin    [provider]  beclomethasone (QVAR) 80 MCG/ACT inhaler Inhale 1 puff into the lungs as needed. 07/24/12   Orlena Sheldon, PA-C  cetirizine (ZYRTEC) 10 MG tablet Take 10 mg by mouth daily.    [provider]  Cholecalciferol (VITAMIN D) 2000 UNITS tablet Take 2,000 Units by mouth daily.    [provider]  Choline Fenofibrate 135 MG capsule Take 1 capsule (135 mg total) by mouth at bedtime. 07/24/12   Orlena Sheldon, PA-C  ibuprofen (ADVIL,MOTRIN) 200 MG tablet Take 400-600 mg by mouth every 6 (six) hours as needed for headache or moderate pain.    [provider]  montelukast (SINGULAIR) 10 MG tablet Take 1 tablet (10 mg total) by mouth at bedtime. 03/24/13   Molpus, John, MD  Multiple Vitamin (MULTIVITAMIN WITH MINERALS) TABS tablet Take 1 tablet by mouth daily.    [provider]  Niacin-Simvastatin 1000-40 MG TB24 Take 1 tablet by mouth at bedtime. 07/24/12   Orlena Sheldon, PA-C  omeprazole (PRILOSEC OTC) 20 MG tablet Take 20 mg by mouth daily.      [provider]  OVER THE COUNTER MEDICATION Take 1 capsule by mouth daily. Shiff Omega 3 krill oil, but not sure  of strength    [provider]  oxyCODONE-acetaminophen (PERCOCET/ROXICET) 5-325 MG per tablet Take 1 tablet by mouth every 6 (six) hours as needed for severe pain. 07/25/13   Dalia Heading, PA-C    Family History Family History  Problem Relation Age of Onset  . Heart disease Mother        MI's  . CAD Mother   . Diabetes Mother   . Alzheimer's disease Father   . Alcohol abuse Father        cirrhosis  . CAD Brother   . Diabetes Brother   . Heart disease Brother        MI's    Social History Social History   Tobacco Use  . Smoking status: Never Smoker  . Smokeless tobacco: Never Used  Substance Use Topics  . Alcohol use: No  . Drug use: No     Allergies   Patient has no known allergies.   Review of Systems Review of Systems  All other systems reviewed and are negative.    Physical Exam Updated Vital Signs BP (!) 211/82 (BP Location: Left Arm)   Pulse 82   Temp 98.7 F (37.1 C) (Oral)   Resp (!) 23   SpO2 97%   Physical Exam  Constitutional: She is oriented to person, place, and time. She appears well-developed and well-nourished.  HENT:  Head: Normocephalic and atraumatic.  Eyes: Conjunctivae and EOM are normal. Pupils are equal, round, and reactive to light.  Neck: Normal range of motion. Neck supple.  Cardiovascular: Normal rate, regular rhythm and normal heart sounds.  Pulmonary/Chest: Effort normal and breath sounds normal.  Abdominal: Soft. Bowel sounds are normal.  Musculoskeletal: Normal range of motion.  Neurological: She is alert and oriented to person, place, and time. She displays normal reflexes. No cranial nerve deficit. Coordination normal.  Skin: Skin is warm and dry. Capillary refill takes less than 2 seconds.  Psychiatric: She has a normal mood and affect.  Nursing note and vitals reviewed.    ED Treatments / Results  Labs (all labs ordered are listed, but only abnormal results are displayed) Labs Reviewed  I-STAT  CHEM 8, ED - Abnormal; Notable for the following components:      Result Value   Potassium 3.1 (*)    All other components within normal limits    EKG  EKG Interpretation  Date/Time:  Friday March 30 2017 14:52:21 EST Ventricular Rate:  78 PR Interval:    QRS Duration: 86 QT Interval:  423 QTC Calculation: 482 R Axis:   41 Text Interpretation:  Sinus rhythm Baseline wander  in lead(s) I Confirmed by Pattricia Boss [phone]) on 03/30/2017 4:05:03 PM       Radiology No results found.  Procedures Procedures (including critical care time)  Medications Ordered in ED Medications  potassium chloride SA (K-DUR,KLOR-CON) CR tablet 40 mEq (not administered)  amLODipine (NORVASC) tablet 5 mg (5 mg Oral Given 03/30/17 1454)     Initial Impression / Assessment and Plan / ED Course  I have reviewed the triage vital signs and the nursing notes.  Pertinent labs & imaging results that were available during my care of the patient were reviewed by me and considered in my medical decision making (see chart for details).    77 year old female new onset asymptomatic hypertension.  Kidney function is normal.  EKG without acute changes.  She is given Norvasc here.  She is given a prescription for Norvasc and advised to have rechecked this week.  We discussed return precautions and she voices understanding  Final Clinical Impressions(s) / ED Diagnoses   Final diagnoses:  Hypertension, unspecified type    ED Discharge Orders    None       Pattricia Boss, MD 03/30/17 1626

## 2017-03-30 NOTE — ED Notes (Signed)
Patient given discharge instructions and verbalized understanding.  Patient stable to discharge at this time.  Patient is alert and oriented to baseline.  No distressed noted at this time.  All belongings taken with the patient at discharge.   

## 2017-03-30 NOTE — ED Triage Notes (Addendum)
Pt had a BP of 200/100 yesterday before her colonoscopy. Pt was given 3mg s Metoprolol IV for her BP while there.

## 2017-04-26 ENCOUNTER — Ambulatory Visit (INDEPENDENT_AMBULATORY_CARE_PROVIDER_SITE_OTHER): Payer: Medicare Other | Admitting: Family Medicine

## 2017-04-26 ENCOUNTER — Encounter: Payer: Self-pay | Admitting: Family Medicine

## 2017-04-26 VITALS — BP 181/105 | HR 85 | Temp 97.8°F | Resp 16 | Ht 64.0 in | Wt 202.0 lb

## 2017-04-26 DIAGNOSIS — I10 Essential (primary) hypertension: Secondary | ICD-10-CM | POA: Diagnosis not present

## 2017-04-26 DIAGNOSIS — E876 Hypokalemia: Secondary | ICD-10-CM

## 2017-04-26 MED ORDER — LISINOPRIL 40 MG PO TABS: 40.0000 mg | ORAL_TABLET | Freq: Every day | ORAL | 3 refills | Status: DC

## 2017-04-26 MED ORDER — AMLODIPINE BESYLATE 5 MG PO TABS: 5.0000 mg | ORAL_TABLET | Freq: Every day | ORAL | 0 refills | Status: DC

## 2017-04-26 MED FILL — LISINOPRIL 40 MG TABLET: 40 | 90 days supply | Qty: 90 | Fill #0

## 2017-04-26 NOTE — Progress Notes (Signed)
Subjective:    Patient ID: Jennifer Wu, female    DOB: [birth year], 77 y.o.   MRN: [id number]  HPI Patient is here today to discuss hypertension.  She has not been seen in this clinic in 5 years.  Review of her chart at that time shows no evidence of hypertension.  She has been seeing her gynecologist annually for the last few years with no mention of hypertension.  Recently saw gastroenterologist for colonoscopy and her blood pressure was found to be significantly elevated at greater than 200/100.  She went to the emergency room and her blood pressure was found to be 230/110.  She was started on amlodipine 5 mg a day and instructed to follow-up here.  Despite being on amlodipine 5 mg, her blood pressure remains elevated though not quite as severe.  She denies any headache, shortness of breath, chest pain, dyspnea on exertion.  She denies any leg claudication, abdominal pain, blurry vision, or hematuria. Past Medical History:  Diagnosis Date  . Arthritis    knees - otc med prn  . Cancer ([place]) 2000-rt lumpectomy-no bp rt arm  . GERD (gastroesophageal reflux disease)   . High cholesterol   . Mixed dyslipidemia   . 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 Seasonal allergies   . Vitamin D deficiency    Past Surgical History:  Procedure Laterality Date  . BACK SURGERY    . BREAST SURGERY    . COLONOSCOPY    . [place]   MAB  . 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 ELBOW SURGERY    . FRACTURE SURGERY  rt foot-1990  . HYSTEROSCOPY W/D&C  06/09/2011   Procedure: DILATATION AND CURETTAGE /HYSTEROSCOPY;  Surgeon: Cheri Fowler, MD;  Location: [facility];  Service: Gynecology;  Laterality: N/A;  . KNEE SURGERY  10/12   right knee  . LUMBAR LAMINECTOMY  1991  . LYMPH NODE BIOPSY     right breast  . RADIAL HEAD IMPLANT  02/01/2011   Procedure:  RADIAL HEAD IMPLANT;  Surgeon: Schuyler Amor, MD;  Location: [facility];  Service: Orthopedics;  Laterality: Left;  left radial head replacement  . SVD     x 3   Current Outpatient Medications on File Prior to Visit  Medication Sig Dispense Refill  . albuterol (PROVENTIL HFA;VENTOLIN HFA) 108 (90 BASE) MCG/ACT inhaler Inhale 2 puffs into the lungs every 6 (six) hours as needed for wheezing. 1 Inhaler 11  . beclomethasone (QVAR) 80 MCG/ACT inhaler Inhale 1 puff into the lungs as needed. (Patient taking differently: Inhale 1 puff into the lungs daily as needed (shortness of breath/wheezing). ) 1 Inhaler 12  . Cholecalciferol (VITAMIN D PO) Take 100 Units by mouth daily.    Marland Kitchen ibuprofen (ADVIL,MOTRIN) 200 MG tablet Take 400-600 mg by mouth every 6 (six) hours as needed for headache or moderate pain.    Javier Docker Oil 500 MG CAPS Take 500 mg by mouth daily.    Marland Kitchen levocetirizine (XYZAL) 5 MG tablet Take 5 mg by mouth daily.    . Multiple Vitamin (MULTIVITAMIN WITH MINERALS) TABS tablet Take 1 tablet by mouth daily.    Marland Kitchen omeprazole (PRILOSEC OTC) 20 MG tablet Take 20 mg by mouth daily before supper.      No current facility-administered medications on file prior  to visit.    No Known Allergies Social History   Socioeconomic History  . Marital status: Married    Spouse name: Not on file  . Number of children: Not on file  . Years of education: Not on file  . Highest education level: Not on file  Social Needs  . Financial resource strain: Not on file  . Food insecurity - worry: Not on file  . Food insecurity - inability: Not on file  . Transportation needs - medical: Not on file  . Transportation needs - non-medical: Not on file  Occupational History  . Not on file  Tobacco Use  . Smoking status: Never Smoker  . Smokeless tobacco: Never Used  Substance and Sexual Activity  . Alcohol use: No  . Drug use: No  . Sexual activity: Not on file  Other Topics Concern  . Not  on file  Social History Narrative  . Not on file      Review of Systems  All other systems reviewed and are negative.      Objective:   Physical Exam  Constitutional: She appears well-developed and well-nourished. No distress.  Neck: No JVD present. No thyromegaly present.  Cardiovascular: Normal rate, regular rhythm and intact distal pulses.  Murmur heard. Pulmonary/Chest: Effort normal and breath sounds normal. No respiratory distress. She has no wheezes. She has no rales.  Abdominal: Soft. Bowel sounds are normal. She exhibits no distension. There is no tenderness. There is no rebound.  Musculoskeletal: She exhibits edema.  Skin: She is not diaphoretic.  Vitals reviewed.         Assessment & Plan:  Hypertension, unspecified type - Plan: Aldosterone + renin activity w/ ratio, CBC with Differential/Platelet, COMPLETE METABOLIC PANEL WITH GFR, VAS US RENAL ARTERY DUPLEX  Hypokalemia - Plan: Aldosterone + renin activity w/ ratio, VAS US RENAL ARTERY DUPLEX  Review of her hospital records shows hypokalemia with potassium of 3.1.  That coupled with her elevated blood pressure of makes me suspicious for primary hyperaldosteronism particular given the fact her blood pressures previously been controlled up until recently renal arteries therefore I will check plasma renin activity as well as plasma aldosterone concentration.  I am also concerned about secondary causes of hypertension given her sudden change in blood pressure.  I will evaluate for renal artery stenosis.  Meanwhile continue amlodipine 5 mg a day but add lisinopril 40 mg a day and recheck blood pressure in 2 weeks.

## 2017-04-27 MED ORDER — AMLODIPINE BESYLATE 5 MG PO TABS: 5.0000 mg | ORAL_TABLET | Freq: Every day | ORAL | 1 refills | Status: DC

## 2017-04-27 MED FILL — AMLODIPINE BESYLATE 5 MG TA: 5 | 90 days supply | Qty: 90 | Fill #0

## 2017-04-27 NOTE — Addendum Note (Signed)
Addended by: Shary Decamp B on: 04/27/2017 09:36 AM   Modules accepted: Orders

## 2017-05-02 LAB — COMPLETE METABOLIC PANEL WITH GFR
AG Ratio: 1.6 (calc) (ref 1.0–2.5)
ALT: 12 U/L (ref 6–29)
AST: 13 U/L (ref 10–35)
Albumin: 4.2 g/dL (ref 3.6–5.1)
Alkaline phosphatase (APISO): 69 U/L (ref 33–130)
BUN: 12 mg/dL (ref 7–25)
CO2: 26 mmol/L (ref 20–32)
Calcium: 9.4 mg/dL (ref 8.6–10.4)
Chloride: 101 mmol/L (ref 98–110)
Creat: 0.61 mg/dL (ref 0.60–0.93)
GFR, Est African American: 103 mL/min/{1.73_m2} (ref >=60)
GFR, Est Non African American: 89 mL/min/{1.73_m2} (ref >=60)
Globulin: 2.6 g/dL (calc) (ref 1.9–3.7)
Glucose, Bld: 92 mg/dL (ref 65–99)
Potassium: 3.5 mmol/L (ref 3.5–5.3)
Sodium: 141 mmol/L (ref 135–146)
Total Bilirubin: 0.3 mg/dL (ref 0.2–1.2)
Total Protein: 6.8 g/dL (ref 6.1–8.1)

## 2017-05-02 LAB — CBC WITH DIFFERENTIAL/PLATELET
Basophils Absolute: 63 cells/uL (ref 0–200)
Basophils Relative: 1 %
Eosinophils Absolute: 233 cells/uL (ref 15–500)
Eosinophils Relative: 3.7 %
HCT: 42.3 % (ref 35.0–45.0)
Hemoglobin: 13.9 g/dL (ref 11.7–15.5)
Lymphs Abs: 2224 cells/uL (ref 850–3900)
MCH: 27 pg (ref 27.0–33.0)
MCHC: 32.9 g/dL (ref 32.0–36.0)
MCV: 82.1 fL (ref 80.0–100.0)
MPV: 11.2 fL (ref 7.5–12.5)
Monocytes Relative: 7 %
Neutro Abs: 3339 cells/uL (ref 1500–7800)
Neutrophils Relative %: 53 %
Platelets: 227 10*3/uL (ref 140–400)
RBC: 5.15 10*6/uL — ABNORMAL HIGH (ref 3.80–5.10)
RDW: 12.7 % (ref 11.0–15.0)
Total Lymphocyte: 35.3 %
WBC mixed population: 441 cells/uL (ref 200–950)
WBC: 6.3 10*3/uL (ref 3.8–10.8)

## 2017-05-02 LAB — ALDOSTERONE + RENIN ACTIVITY W/ RATIO: Aldosterone: 2 ng/dL

## 2017-05-15 ENCOUNTER — Encounter: Payer: Self-pay | Admitting: Family Medicine

## 2017-05-15 ENCOUNTER — Ambulatory Visit (INDEPENDENT_AMBULATORY_CARE_PROVIDER_SITE_OTHER): Payer: Medicare Other | Admitting: Family Medicine

## 2017-05-15 VITALS — BP 163/97 | HR 82 | Temp 98.0°F | Resp 16 | Ht 64.0 in | Wt 197.0 lb

## 2017-05-15 DIAGNOSIS — R634 Abnormal weight loss: Secondary | ICD-10-CM | POA: Diagnosis not present

## 2017-05-15 DIAGNOSIS — R1111 Vomiting without nausea: Secondary | ICD-10-CM

## 2017-05-15 DIAGNOSIS — R05 Cough: Secondary | ICD-10-CM | POA: Diagnosis not present

## 2017-05-15 DIAGNOSIS — K219 Gastro-esophageal reflux disease without esophagitis: Secondary | ICD-10-CM | POA: Diagnosis not present

## 2017-05-15 DIAGNOSIS — I10 Essential (primary) hypertension: Secondary | ICD-10-CM

## 2017-05-15 DIAGNOSIS — R053 Chronic cough: Secondary | ICD-10-CM

## 2017-05-15 LAB — LIPID PANEL
Cholesterol: 239 mg/dL — ABNORMAL HIGH (ref <200)
HDL: 51 mg/dL (ref >50)
LDL Cholesterol (Calc): 158 mg/dL (calc) — ABNORMAL HIGH
Non-HDL Cholesterol (Calc): 188 mg/dL (calc) — ABNORMAL HIGH (ref <130)
Total CHOL/HDL Ratio: 4.7 (calc) (ref <5.0)
Triglycerides: 161 mg/dL — ABNORMAL HIGH (ref <150)

## 2017-05-15 LAB — COMPLETE METABOLIC PANEL WITH GFR
AG Ratio: 1.6 (calc) (ref 1.0–2.5)
ALT: 14 U/L (ref 6–29)
AST: 18 U/L (ref 10–35)
Albumin: 4.5 g/dL (ref 3.6–5.1)
Alkaline phosphatase (APISO): 77 U/L (ref 33–130)
BUN: 14 mg/dL (ref 7–25)
CO2: 27 mmol/L (ref 20–32)
Calcium: 9.8 mg/dL (ref 8.6–10.4)
Chloride: 101 mmol/L (ref 98–110)
Creat: 0.71 mg/dL (ref 0.60–0.93)
GFR, Est African American: 97 mL/min/{1.73_m2} (ref >=60)
GFR, Est Non African American: 83 mL/min/{1.73_m2} (ref >=60)
Globulin: 2.8 g/dL (calc) (ref 1.9–3.7)
Glucose, Bld: 107 mg/dL — ABNORMAL HIGH (ref 65–99)
Potassium: 4.5 mmol/L (ref 3.5–5.3)
Sodium: 138 mmol/L (ref 135–146)
Total Bilirubin: 0.3 mg/dL (ref 0.2–1.2)
Total Protein: 7.3 g/dL (ref 6.1–8.1)

## 2017-05-15 LAB — EXTRA LAV TOP TUBE

## 2017-05-15 MED ORDER — PANTOPRAZOLE SODIUM 40 MG PO TBEC: 40.0000 mg | DELAYED_RELEASE_TABLET | Freq: Every day | ORAL | 3 refills | Status: DC

## 2017-05-15 MED ORDER — HYDROCHLOROTHIAZIDE 25 MG PO TABS: 25.0000 mg | ORAL_TABLET | Freq: Every day | ORAL | 3 refills | Status: DC

## 2017-05-15 MED FILL — PANTOPRAZOLE SOD DR 40 MG T: 40 | 30 days supply | Qty: 30 | Fill #0

## 2017-05-15 MED FILL — HYDROCHLOROTHIAZIDE 25 MG T: 25 | 90 days supply | Qty: 90 | Fill #0

## 2017-05-15 NOTE — Progress Notes (Signed)
Subjective:    Patient ID: Jennifer Wu, female    DOB: 1940-07-21, 77 y.o.   MRN: [id number]  HPI  04/26/17 Patient is here today to discuss hypertension.  She has not been seen in this clinic in 5 years.  Review of her chart at that time shows no evidence of hypertension.  She has been seeing her gynecologist annually for the last few years with no mention of hypertension.  Recently saw gastroenterologist for colonoscopy and her blood pressure was found to be significantly elevated at greater than 200/100.  She went to the emergency room and her blood pressure was found to be 230/110.  She was started on amlodipine 5 mg a day and instructed to follow-up here.  Despite being on amlodipine 5 mg, her blood pressure remains elevated though not quite as severe.  She denies any headache, shortness of breath, chest pain, dyspnea on exertion.  She denies any leg claudication, abdominal pain, blurry vision, or hematuria.  At that time, my plan was: Review of her hospital records shows hypokalemia with potassium of 3.1.  That coupled with her elevated blood pressure of makes me suspicious for primary hyperaldosteronism particular given the fact her blood pressures previously been controlled up until recently renal arteries therefore I will check plasma renin activity as well as plasma aldosterone concentration.  I am also concerned about secondary causes of hypertension given her sudden change in blood pressure.  I will evaluate for renal artery stenosis.  Meanwhile continue amlodipine 5 mg a day but add lisinopril 40 mg a day and recheck blood pressure in 2 weeks.  05/15/16 Lab work obtained at the last visit was unremarkable.  However patient had to discontinue lisinopril due to a worsening cough.  She stopped the lisinopril 2 days ago.  She is here today with her daughter.  They state that she has had a cough for years.  It seemed to worsen with the lisinopril.  The patient states she has a very sensitive  gag reflex.  Due to all the coughing, it would constantly trigger her gag reflex and make her vomit.  She is vomiting multiple times a day according to her.  She is lost 5 pounds since her last visit.  In fact she is afraid to eat due to the vomiting.  Therefore I have a patient with 5 pounds weight loss, daily vomiting, and a chronic cough.  She also reports reflux and indigestion almost on a daily basis.  Her blood pressure today remains elevated at 163/97.  At the present time she is only taking amlodipine. Past Medical History:  Diagnosis Date  . Arthritis    knees - otc med prn  . Cancer ([place]) 2000-rt lumpectomy-no bp rt arm  . GERD (gastroesophageal reflux disease)   . High cholesterol   . Mixed dyslipidemia   . 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 Seasonal allergies   . Vitamin D deficiency    Past Surgical History:  Procedure Laterality Date  . BACK SURGERY    . BREAST SURGERY    . COLONOSCOPY    . [place]   MAB  . 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 ELBOW SURGERY    . FRACTURE SURGERY  rt foot-1990  . HYSTEROSCOPY W/D&C  06/09/2011   Procedure: DILATATION AND CURETTAGE /HYSTEROSCOPY;  Surgeon: Cheri Fowler, MD;  Location: [facility];  Service: Gynecology;  Laterality: N/A;  . KNEE SURGERY  10/12   right knee  . LUMBAR LAMINECTOMY  1991  . LYMPH NODE BIOPSY     right breast  . RADIAL HEAD IMPLANT  02/01/2011   Procedure: RADIAL HEAD IMPLANT;  Surgeon: Schuyler Amor, MD;  Location: [facility];  Service: Orthopedics;  Laterality: Left;  left radial head replacement  . SVD     x 3   Current Outpatient Medications on File Prior to Visit  Medication Sig Dispense Refill  . albuterol (PROVENTIL HFA;VENTOLIN HFA) 108 (90 BASE) MCG/ACT inhaler Inhale 2 puffs into the lungs every 6 (six) hours as needed for  wheezing. 1 Inhaler 11  . amLODipine (NORVASC) 5 MG tablet Take 1 tablet (5 mg total) by mouth daily. 90 tablet 1  . beclomethasone (QVAR) 80 MCG/ACT inhaler Inhale 1 puff into the lungs as needed. (Patient taking differently: Inhale 1 puff into the lungs daily as needed (shortness of breath/wheezing). ) 1 Inhaler 12  . Cholecalciferol (VITAMIN D PO) Take 100 Units by mouth daily.    Marland Kitchen ibuprofen (ADVIL,MOTRIN) 200 MG tablet Take 400-600 mg by mouth every 6 (six) hours as needed for headache or moderate pain.    Javier Docker Oil 500 MG CAPS Take 500 mg by mouth daily.    Marland Kitchen levocetirizine (XYZAL) 5 MG tablet Take 5 mg by mouth daily.    . Multiple Vitamin (MULTIVITAMIN WITH MINERALS) TABS tablet Take 1 tablet by mouth daily.    Marland Kitchen omeprazole (PRILOSEC OTC) 20 MG tablet Take 20 mg by mouth daily before supper.     Marland Kitchen lisinopril (PRINIVIL,ZESTRIL) 40 MG tablet Take 1 tablet (40 mg total) by mouth daily. (Patient not taking: Reported on 05/15/2017) 90 tablet 3   No current facility-administered medications on file prior to visit.    No Known Allergies Social History   Socioeconomic History  . Marital status: Married    Spouse name: Not on file  . Number of children: Not on file  . Years of education: Not on file  . Highest education level: Not on file  Social Needs  . Financial resource strain: Not on file  . Food insecurity - worry: Not on file  . Food insecurity - inability: Not on file  . Transportation needs - medical: Not on file  . Transportation needs - non-medical: Not on file  Occupational History  . Not on file  Tobacco Use  . Smoking status: Never Smoker  . Smokeless tobacco: Never Used  Substance and Sexual Activity  . Alcohol use: No  . Drug use: No  . Sexual activity: Not on file  Other Topics Concern  . Not on file  Social History Narrative  . Not on file      Review of Systems  All other systems reviewed and are negative.      Objective:   Physical Exam    Constitutional: She appears well-developed and well-nourished. No distress.  Neck: No JVD present. No thyromegaly present.  Cardiovascular: Normal rate, regular rhythm and intact distal pulses.  Murmur heard. Pulmonary/Chest: Effort normal and breath sounds normal. No respiratory distress. She has no wheezes. She has no rales.  Abdominal: Soft. Bowel sounds are normal. She exhibits no distension. There is no tenderness. There is no rebound.  Musculoskeletal: She exhibits edema.  Skin: She is not diaphoretic.  Vitals reviewed.  Assessment & Plan:  Benign essential HTN - Plan: COMPLETE METABOLIC PANEL WITH GFR, Lipid panel  Non-intractable vomiting without nausea, unspecified vomiting type  Chronic cough  Gastroesophageal reflux disease, esophagitis presence not specified  Weight loss  Given the chronic cough, I suspect laryngo-esophageal reflux.  Patient could possibly have a hiatal hernia with constant reflux triggering the cough.  This could also explain the indigestion.  The cough seems to be triggering her gag reflex and causing vomiting.  Therefore I will empirically start the patient on Protonix 40 mg a day and recheck the patient in 2 weeks to see if she is noticing improvement.  Given the vomiting and weight loss, I will also consult GI for possible EGD to evaluate for gastric outlet obstruction, hiatal hernia, gastritis.  Patient will continue amlodipine for blood pressure but I will add hydrochlorothiazide 25 mg a day and recheck blood pressure in 2 weeks

## 2017-05-17 MED FILL — BENZONATATE 100 MG CAPS: 100 | 30 days supply | Qty: 90 | Fill #0

## 2017-05-21 ENCOUNTER — Ambulatory Visit (HOSPITAL_COMMUNITY)
Admission: RE | Admit: 2017-05-21 | Discharge: 2017-05-21 | Disposition: A | Discharge status: Home / Self Care | Payer: Medicare Other | Source: Ambulatory Visit | Attending: Family Medicine | Admitting: Family Medicine

## 2017-05-21 DIAGNOSIS — I10 Essential (primary) hypertension: Secondary | ICD-10-CM | POA: Insufficient documentation

## 2017-05-21 DIAGNOSIS — E876 Hypokalemia: Secondary | ICD-10-CM

## 2017-05-21 DIAGNOSIS — E786 Lipoprotein deficiency: Secondary | ICD-10-CM | POA: Insufficient documentation

## 2017-05-21 DIAGNOSIS — I701 Atherosclerosis of renal artery: Secondary | ICD-10-CM | POA: Insufficient documentation

## 2017-05-23 ENCOUNTER — Other Ambulatory Visit: Payer: Self-pay | Admitting: Family Medicine

## 2017-05-23 MED ORDER — ATORVASTATIN CALCIUM 20 MG PO TABS: 20.0000 mg | ORAL_TABLET | Freq: Every day | ORAL | 3 refills | Status: DC

## 2017-05-23 MED FILL — ATORVASTATIN 20 MG TABLET: 20 | 90 days supply | Qty: 90 | Fill #0

## 2017-05-24 ENCOUNTER — Encounter (HOSPITAL_COMMUNITY): Payer: Medicare Other

## 2017-05-29 ENCOUNTER — Ambulatory Visit: Payer: Medicare Other | Admitting: Family Medicine

## 2017-05-29 ENCOUNTER — Ambulatory Visit
Admission: RE | Admit: 2017-05-29 | Discharge: 2017-05-29 | Disposition: A | Discharge status: Home / Self Care | Payer: Medicare Other | Source: Ambulatory Visit | Attending: Family Medicine | Admitting: Family Medicine

## 2017-05-29 ENCOUNTER — Encounter: Payer: Self-pay | Admitting: Family Medicine

## 2017-05-29 ENCOUNTER — Other Ambulatory Visit: Payer: Self-pay

## 2017-05-29 VITALS — BP 150/88 | HR 81 | Temp 97.6°F | Resp 14 | Wt 197.0 lb

## 2017-05-29 DIAGNOSIS — R053 Chronic cough: Secondary | ICD-10-CM

## 2017-05-29 DIAGNOSIS — I10 Essential (primary) hypertension: Secondary | ICD-10-CM | POA: Diagnosis not present

## 2017-05-29 DIAGNOSIS — R1111 Vomiting without nausea: Secondary | ICD-10-CM | POA: Diagnosis not present

## 2017-05-29 DIAGNOSIS — K219 Gastro-esophageal reflux disease without esophagitis: Secondary | ICD-10-CM

## 2017-05-29 DIAGNOSIS — R05 Cough: Secondary | ICD-10-CM

## 2017-05-29 MED ORDER — PANTOPRAZOLE SODIUM 40 MG PO TBEC: 40.0000 mg | DELAYED_RELEASE_TABLET | Freq: Two times a day (BID) | ORAL | 3 refills | Status: DC

## 2017-05-29 MED FILL — PANTOPRAZOLE SOD DR 40 MG T: 40 | 30 days supply | Qty: 60 | Fill #0

## 2017-05-29 MED FILL — traMADol HCL 50 MG TABS: 50 | 7 days supply | Qty: 21 | Fill #0

## 2017-05-29 NOTE — Progress Notes (Signed)
Subjective:    Patient ID: Jennifer Wu, female    DOB: 1940-10-06, 77 y.o.   MRN: [id number]  HPI  04/26/17 Patient is here today to discuss hypertension.  She has not been seen in this clinic in 5 years.  Review of her chart at that time shows no evidence of hypertension.  She has been seeing her gynecologist annually for the last few years with no mention of hypertension.  Recently saw gastroenterologist for colonoscopy and her blood pressure was found to be significantly elevated at greater than 200/100.  She went to the emergency room and her blood pressure was found to be 230/110.  She was started on amlodipine 5 mg a day and instructed to follow-up here.  Despite being on amlodipine 5 mg, her blood pressure remains elevated though not quite as severe.  She denies any headache, shortness of breath, chest pain, dyspnea on exertion.  She denies any leg claudication, abdominal pain, blurry vision, or hematuria.  At that time, my plan was: Review of her hospital records shows hypokalemia with potassium of 3.1.  That coupled with her elevated blood pressure of makes me suspicious for primary hyperaldosteronism particular given the fact her blood pressures previously been controlled up until recently renal arteries therefore I will check plasma renin activity as well as plasma aldosterone concentration.  I am also concerned about secondary causes of hypertension given her sudden change in blood pressure.  I will evaluate for renal artery stenosis.  Meanwhile continue amlodipine 5 mg a day but add lisinopril 40 mg a day and recheck blood pressure in 2 weeks.  05/15/16 Lab work obtained at the last visit was unremarkable.  However patient had to discontinue lisinopril due to a worsening cough.  She stopped the lisinopril 2 days ago.  She is here today with her daughter.  They state that she has had a cough for years.  It seemed to worsen with the lisinopril.  The patient states she has a very sensitive  gag reflex.  Due to all the coughing, it would constantly trigger her gag reflex and make her vomit.  She is vomiting multiple times a day according to her.  She is lost 5 pounds since her last visit.  In fact she is afraid to eat due to the vomiting.  Therefore I have a patient with 5 pounds weight loss, daily vomiting, and a chronic cough.  She also reports reflux and indigestion almost on a daily basis.  Her blood pressure today remains elevated at 163/97.  At the present time she is only taking amlodipine.  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  Patient could possibly have a hiatal hernia with constant reflux triggering the cough.  This could also explain the indigestion.  The cough seems to be triggering her gag reflex and causing vomiting.  Therefore I will empirically start the patient on Protonix 40 mg a day and recheck the patient in 2 weeks to see if she is noticing improvement.  Given the vomiting and weight loss, I will also consult GI for possible EGD to evaluate for gastric outlet obstruction, hiatal hernia, gastritis.  Patient will continue amlodipine for blood pressure but I will add hydrochlorothiazide 25 mg a day and recheck blood pressure in 2 weeks  05/29/17 She continues to cough.  Cough is some better but not totally better.  Unfortunately she continues to have episodes of nausea and vomiting related to the cough.  Indigestion is much better although she is continues to have acid reflux.  She denies any hemoptysis.  She denies any shortness of breath.  She denies any chest pain.  Blood pressure today is much better although still not at goal.  Studies did not show severe renal artery stenosis. Past Medical History:  Diagnosis Date  . Arthritis    knees - otc med prn  . Cancer ([place]) 2000-rt lumpectomy-no bp rt arm  . GERD (gastroesophageal reflux disease)   . High cholesterol   . Mixed dyslipidemia   . 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 Seasonal allergies   . Vitamin D deficiency    Past Surgical History:  Procedure Laterality Date  . BACK SURGERY    . BREAST SURGERY    . COLONOSCOPY    . [place]   [facility]  . 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 ELBOW SURGERY    . FRACTURE SURGERY  rt foot-1990  . HYSTEROSCOPY W/D&C  06/09/2011   Procedure: DILATATION AND CURETTAGE /HYSTEROSCOPY;  Surgeon: Cheri Fowler, MD;  Location: [facility];  Service: Gynecology;  Laterality: N/A;  . KNEE SURGERY  10/12   right knee  . LUMBAR LAMINECTOMY  1991  . LYMPH NODE BIOPSY     right breast  . RADIAL HEAD IMPLANT  02/01/2011   Procedure: RADIAL HEAD IMPLANT;  Surgeon: Schuyler Amor, MD;  Location: [facility];  Service: Orthopedics;  Laterality: Left;  left radial head replacement  . SVD     x 3   Current Outpatient Medications on File Prior to Visit  Medication Sig Dispense Refill  . albuterol (PROVENTIL HFA;VENTOLIN HFA) 108 (90 BASE) MCG/ACT inhaler Inhale 2 puffs into the lungs every 6 (six) hours as needed for wheezing. 1 Inhaler 11  . amLODipine (NORVASC) 5 MG tablet Take 1 tablet (5 mg total) by mouth daily. 90 tablet 1  . atorvastatin (LIPITOR) 20 MG tablet Take 1 tablet (20 mg total) by mouth daily. 90 tablet 3  . beclomethasone (QVAR) 80 MCG/ACT inhaler Inhale 1 puff into the lungs as needed. (Patient taking differently: Inhale 1 puff into the lungs daily as needed (shortness of breath/wheezing). ) 1 Inhaler 12  . hydrochlorothiazide (HYDRODIURIL) 25 MG tablet Take 1 tablet (25 mg total) by mouth daily. 90 tablet 3  . ibuprofen (ADVIL,MOTRIN) 200 MG tablet Take 400-600 mg by mouth every 6 (six) hours as needed for headache or moderate pain.    Javier Docker Oil 500 MG CAPS Take 500 mg by mouth daily.    Marland Kitchen levocetirizine (XYZAL) 5 MG tablet Take 5 mg by mouth  daily.    . Cholecalciferol (VITAMIN D PO) Take 100 Units by mouth daily.    . Multiple Vitamin (MULTIVITAMIN WITH MINERALS) TABS tablet Take 1 tablet by mouth daily.     No current facility-administered medications on file prior to visit.    No Known Allergies Social History   Socioeconomic History  . Marital status: Married    Spouse name: Not on file  . Number of children: Not on file  . Years of education: Not on file  . Highest education level: Not on file  Social Needs  . Financial resource strain: Not on file  . Food insecurity - worry: Not on file  . Food insecurity - inability:  Not on file  . Transportation needs - medical: Not on file  . Transportation needs - non-medical: Not on file  Occupational History  . Not on file  Tobacco Use  . Smoking status: Never Smoker  . Smokeless tobacco: Never Used  Substance and Sexual Activity  . Alcohol use: No  . Drug use: No  . Sexual activity: Not on file  Other Topics Concern  . Not on file  Social History Narrative  . Not on file      Review of Systems  All other systems reviewed and are negative.      Objective:   Physical Exam  Constitutional: She appears well-developed and well-nourished. No distress.  Neck: No JVD present. No thyromegaly present.  Cardiovascular: Normal rate, regular rhythm and intact distal pulses.  Murmur heard. Pulmonary/Chest: Effort normal and breath sounds normal. No respiratory distress. She has no wheezes. She has no rales.  Abdominal: Soft. Bowel sounds are normal. She exhibits no distension. There is no tenderness. There is no rebound.  Musculoskeletal: She exhibits edema.  Skin: She is not diaphoretic.  Vitals reviewed.         Assessment & Plan:  Chronic cough - Plan: pantoprazole (PROTONIX) 40 MG tablet, DG Chest 2 View  Benign essential HTN  Non-intractable vomiting without nausea, unspecified vomiting type  Gastroesophageal reflux disease, esophagitis presence not  specified  Given the chronic nature of the cough, I have recommended a chest x-ray.  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  I have recommended increasing Protonix to 40 mg p.o. twice daily as well as adding tramadol 50 mg every 8 hours as needed for cough, nerve irritation in the throat.  Recheck in 2-3 weeks.  Hopefully at that point cough will have improved.  I will tolerate the elevated blood pressure today to allow the patient more time to adjust to her new medication.  If still greater than 140/90 in 3 weeks, will likely need additional medication.

## 2017-05-30 ENCOUNTER — Encounter: Payer: Self-pay | Admitting: Family Medicine

## 2017-06-04 MED FILL — traMADol HCL 50 MG TABS: 50 | 13 days supply | Qty: 39 | Fill #1

## 2017-07-09 MED FILL — PANTOPRAZOLE SOD DR 40 MG T: 40 | 30 days supply | Qty: 60 | Fill #1

## 2017-07-10 MED FILL — traMADol HCL 50 MG TABS: 50 | 5 days supply | Qty: 30 | Fill #0

## 2017-07-30 MED FILL — AMLODIPINE BESYLATE 5 MG TA: 5 | 90 days supply | Qty: 90 | Fill #1

## 2017-07-30 MED FILL — HYDROCHLOROTHIAZIDE 25 MG T: 25 | 90 days supply | Qty: 90 | Fill #1 | Status: TO

## 2017-08-13 MED FILL — ATORVASTATIN 20 MG TABLET: 20 | 90 days supply | Qty: 90 | Fill #1

## 2017-08-29 MED FILL — PANTOPRAZOLE SOD DR 40 MG T: 40 | 30 days supply | Qty: 60 | Fill #2

## 2017-10-15 MED FILL — PANTOPRAZOLE SOD DR 40 MG T: 40 | 30 days supply | Qty: 60 | Fill #3

## 2017-11-05 ENCOUNTER — Other Ambulatory Visit: Payer: Self-pay | Admitting: Family Medicine

## 2017-11-19 MED FILL — ATORVASTATIN CALCIUM 20 MG: 20 | 90 days supply | Qty: 90 | Fill #2

## 2017-12-10 ENCOUNTER — Other Ambulatory Visit: Payer: Self-pay | Admitting: Family Medicine

## 2017-12-10 DIAGNOSIS — R053 Chronic cough: Secondary | ICD-10-CM

## 2017-12-10 DIAGNOSIS — R05 Cough: Secondary | ICD-10-CM

## 2017-12-10 MED FILL — PANTOPRAZOLE SOD DR 40 MG T: 40 | 30 days supply | Qty: 60 | Fill #0

## 2018-01-30 ENCOUNTER — Other Ambulatory Visit: Payer: Self-pay | Admitting: Family Medicine

## 2018-01-30 NOTE — Telephone Encounter (Signed)
Requesting refill    Tramadol  LOV: 05/29/17  LRF:  05/29/17

## 2018-01-31 MED FILL — traMADol HCL 50 MG TABS: 50 | 7 days supply | Qty: 21 | Fill #0

## 2018-02-06 MED FILL — PANTOPRAZOLE SOD DR 40 MG T: 40 | 30 days supply | Qty: 60 | Fill #1

## 2018-02-06 MED FILL — ATORVASTATIN CALCIUM 20 MG: 20 | 90 days supply | Qty: 90 | Fill #3

## 2018-03-05 ENCOUNTER — Encounter: Payer: Self-pay | Admitting: Family Medicine

## 2018-03-05 ENCOUNTER — Ambulatory Visit (INDEPENDENT_AMBULATORY_CARE_PROVIDER_SITE_OTHER): Payer: Medicare Other | Admitting: Family Medicine

## 2018-03-05 VITALS — BP 136/84 | HR 75 | Temp 97.8°F | Resp 16 | Ht 64.0 in | Wt 196.5 lb

## 2018-03-05 DIAGNOSIS — R05 Cough: Secondary | ICD-10-CM | POA: Diagnosis not present

## 2018-03-05 DIAGNOSIS — K219 Gastro-esophageal reflux disease without esophagitis: Secondary | ICD-10-CM | POA: Diagnosis not present

## 2018-03-05 DIAGNOSIS — J324 Chronic pansinusitis: Secondary | ICD-10-CM | POA: Diagnosis not present

## 2018-03-05 DIAGNOSIS — J45901 Unspecified asthma with (acute) exacerbation: Secondary | ICD-10-CM

## 2018-03-05 DIAGNOSIS — R053 Chronic cough: Secondary | ICD-10-CM

## 2018-03-05 MED ORDER — BENZONATATE 100 MG PO CAPS: 100.0000 mg | ORAL_CAPSULE | Freq: Three times a day (TID) | ORAL | 1 refills | Status: DC | PRN

## 2018-03-05 MED ORDER — HYDROCODONE-HOMATROPINE 5-1.5 MG/5ML PO SYRP: 5.0000 mL | ORAL_SOLUTION | Freq: Three times a day (TID) | ORAL | 0 refills | Status: DC | PRN

## 2018-03-05 MED ORDER — MONTELUKAST SODIUM 10 MG PO TABS: 10.0000 mg | ORAL_TABLET | Freq: Every day | ORAL | 3 refills | Status: DC

## 2018-03-05 MED ORDER — AMOXICILLIN-POT CLAVULANATE 875-125 MG PO TABS: 1.0000 | ORAL_TABLET | Freq: Two times a day (BID) | ORAL | 0 refills | Status: AC

## 2018-03-05 MED ORDER — LEVALBUTEROL HCL 0.63 MG/3ML IN NEBU: 0.6300 mg | INHALATION_SOLUTION | Freq: Three times a day (TID) | RESPIRATORY_TRACT | 12 refills | Status: DC | PRN

## 2018-03-05 MED FILL — HYDROCODONE-HOMATROPINE SOL: 5-1.5 | 8 days supply | Qty: 120 | Fill #0

## 2018-03-05 MED FILL — AMOX-CLAV 875-125 MG TABLET: 875-125 | 7 days supply | Qty: 14 | Fill #0

## 2018-03-05 MED FILL — BENZONATATE 100 MG CAPS: 100 | 10 days supply | Qty: 30 | Fill #0

## 2018-03-05 MED FILL — MONTELUKAST SOD 10 MG TAB: 10 | 30 days supply | Qty: 30 | Fill #0

## 2018-03-05 NOTE — Progress Notes (Signed)
Patient ID: Jennifer Wu, female    DOB: 1940/10/23, 77 y.o.   MRN: [id number]  PCP: [name], MD  Chief Complaint  Patient presents with  . Cough    Patient has c/o cough onset last december. Has been taking acid reflux, and tramadol for cough per gastro    Subjective:   Jennifer Wu is a 77 y.o. female, presents to clinic with CC of chronic cough and acid reflux which has worsened in the last couple weeks.  She states that she has been taking Protonix 40 mg 1-2 times a day, this had helped with her coughing spasms and acid reflux until recently.  She had been to see her gastroenterologist who told her to stop taking the Protonix and tramadol.  These are both known issues that she has seen her primary care for multiple times and has been treating for over a year.  She states was bothering her recently and that the coughing fits are more severe she has nasal congestion, postnasal drip and this is constantly triggering more coughing.  She says the coughing fits make it difficult for her to breathe and she often is choking and vomiting up emesis is stomach contents.  She has been avoiding eating because of the posttussive emesis and so she believes she has lost a few pounds in the past month.  Her coughing fits cause her to be red in the face, have neck pain and head pressure.  She does have worsening GERD especially when eating anything red such as spaghetti so she has been eating more bland food.  She does not currently have sinus pain or pressure or headaches but she has drainage.  She has not been taking any over-the-counter medicines to treat this, any allergy medications for it.  She has not been using any inhalers although she was previously on Qvar and albuterol for asthma.  She has borrowed her daughter's nebulizer and albuterol nebs a few times and this does help with the coughing fits but it causes her to be very jittery causes her hands to shake and she gets lightheaded for  5 to 15 minutes after the treatment. Patient denies any chest pain, wheeze, palpitations, orthopnea, PND, lower extremity edema.  She has some difficulty swallowing things like rice she feels like it gets "hung up or stuck" causes hiccups and coughing fits she denies any other dysphagia choking or aspiration, denies any difficulty with swallowing liquids. The only thing that is helped her symptoms in the last month is when she took some old Percocet she found.  She states that tramadol was prescribed Dr. Dennard Schaumann for pain coughing and it stopped working.  She says she has plenty of Tessalon Perles and those do nothing for her, she request Tussionex or hydrocodone cough syrup.  No recent fevers, body aches, sweats, hot or cold chills.  Patient Active Problem List   Diagnosis Date Noted  . Asthma with acute exacerbation 07/24/2012  . Vitamin D deficiency   . Seasonal allergies 07/19/2012  . Mixed dyslipidemia      Prior to Admission medications   Medication Sig Start Date End Date Taking? Authorizing Provider  amLODipine (NORVASC) 5 MG tablet TAKE 1 TABLET (5 MG TOTAL) BY MOUTH DAILY. 11/05/17  Yes [name], MD  atorvastatin (LIPITOR) 20 MG tablet Take 1 tablet (20 mg total) by mouth daily. 05/23/17  Yes [name], MD  Cholecalciferol (VITAMIN D PO) Take 100 Units by mouth daily.  Yes [provider]  hydrochlorothiazide (HYDRODIURIL) 25 MG tablet Take 1 tablet (25 mg total) by mouth daily. 05/15/17  Yes [name], MD  ibuprofen (ADVIL,MOTRIN) 200 MG tablet Take 400-600 mg by mouth every 6 (six) hours as needed for headache or moderate pain.   Yes [provider]  levocetirizine (XYZAL) 5 MG tablet Take 5 mg by mouth daily.   Yes [provider]  traMADol (ULTRAM) 50 MG tablet TAKE 1 TABLET BY MOUTH EVERY 8 HOURS 01/31/18  Yes [name], MD  albuterol (PROVENTIL HFA;VENTOLIN HFA) 108 (90 BASE) MCG/ACT inhaler Inhale 2 puffs into the lungs  every 6 (six) hours as needed for wheezing. Patient not taking: Reported on 03/05/2018 01/09/13   Dena Billet B, PA-C  beclomethasone (QVAR) 80 MCG/ACT inhaler Inhale 1 puff into the lungs as needed. Patient not taking: Reported on 03/05/2018 07/24/12   Orlena Sheldon, PA-C  Krill Oil 500 MG CAPS Take 500 mg by mouth daily.    [provider]  Multiple Vitamin (MULTIVITAMIN WITH MINERALS) TABS tablet Take 1 tablet by mouth daily.    [provider]  pantoprazole (PROTONIX) 40 MG tablet TAKE 1 TABLET BY MOUTH 2 TIMES DAILY. Patient not taking: Reported on 03/05/2018 12/10/17   [name], MD     Allergies  Allergen Reactions  . Lisinopril Nausea And Vomiting     Family History  Problem Relation Age of Onset  . Heart disease Mother        MI's  . CAD Mother   . Diabetes Mother   . Alzheimer's disease Father   . Alcohol abuse Father        cirrhosis  . CAD Brother   . Diabetes Brother   . Heart disease Brother        MI's     Social History   Socioeconomic History  . Marital status: Married    Spouse name: Not on file  . Number of children: Not on file  . Years of education: Not on file  . Highest education level: Not on file  Occupational History  . Not on file  Social Needs  . Financial resource strain: Not on file  . Food insecurity:    Worry: Not on file    Inability: Not on file  . Transportation needs:    Medical: Not on file    Non-medical: Not on file  Tobacco Use  . Smoking status: Never Smoker  . Smokeless tobacco: Never Used  Substance and Sexual Activity  . Alcohol use: No  . Drug use: No  . Sexual activity: Not on file  Lifestyle  . Physical activity:    Days per week: Not on file    Minutes per session: Not on file  . Stress: Not on file  Relationships  . Social connections:    Talks on phone: Not on file    Gets together: Not on file    Attends religious service: Not on file    Active member of club or organization:  Not on file    Attends meetings of clubs or organizations: Not on file    Relationship status: Not on file  . Intimate partner violence:    Fear of current or ex partner: Not on file    Emotionally abused: Not on file    Physically abused: Not on file    Forced sexual activity: Not on file  Other Topics Concern  . Not on file  Social History Narrative  . Not on file     Review of Systems  Constitutional: Negative.   HENT: Negative.   Eyes: Negative.   Respiratory: Negative.   Cardiovascular: Negative.   Gastrointestinal: Negative.   Endocrine: Negative.   Genitourinary: Negative.   Musculoskeletal: Negative.   Skin: Negative.   Allergic/Immunologic: Negative.   Neurological: Negative.   Hematological: Negative.   Psychiatric/Behavioral: Negative.   All other systems reviewed and are negative.      Objective:    Vitals:   03/05/18 0822  BP: 136/84  Pulse: 75  Resp: 16  Temp: 97.8 F (36.6 C)  TempSrc: Oral  SpO2: 96%  Weight: 196 lb 8 oz (89.1 kg)  Height: 5\' 4"  (1.626 m)      Physical Exam  Constitutional: She appears well-developed and well-nourished.  Non-toxic appearance. She does not have a sickly appearance. She does not appear ill. No distress.  HENT:  Head: Normocephalic and atraumatic.  Right Ear: Hearing, tympanic membrane, external ear and ear canal normal.  Left Ear: Hearing, tympanic membrane, external ear and ear canal normal.  Nose: Mucosal edema and rhinorrhea present. Right sinus exhibits no maxillary sinus tenderness and no frontal sinus tenderness. Left sinus exhibits no maxillary sinus tenderness and no frontal sinus tenderness.  Mouth/Throat: Uvula is midline and mucous membranes are normal. Mucous membranes are not pale. No uvula swelling. No oropharyngeal exudate or tonsillar abscesses.  Eyes: Pupils are equal, round, and reactive to light. Conjunctivae are normal. Right eye exhibits no discharge. Left eye exhibits no discharge.  Neck:  Normal range of motion. Neck supple. No tracheal deviation present.  Cardiovascular: Normal rate, regular rhythm, normal heart sounds and intact distal pulses. Exam reveals no gallop and no friction rub.  No murmur heard. Pulmonary/Chest: Effort normal and breath sounds normal. No stridor. No respiratory distress. She has no wheezes. She has no rhonchi. She has no rales.  Abdominal: Soft. Bowel sounds are normal. She exhibits no distension.  Musculoskeletal: Normal range of motion.  Neurological: She is alert. She exhibits normal muscle tone. Coordination normal.  Skin: Skin is warm and dry. No rash noted. She is not diaphoretic. No pallor.  Psychiatric: She has a normal mood and affect. Her behavior is normal.  Nursing note and vitals reviewed.         Assessment & Plan:      ICD-10-CM   1. Chronic cough R05 montelukast (SINGULAIR) 10 MG tablet    benzonatate (TESSALON) 100 MG capsule    HYDROcodone-homatropine (HYCODAN) 5-1.5 MG/5ML syrup   tx sinuses to decrease postnasal drip, continue GERD treatment, tx asthma - rx xopenex for hx asthma due to albuterol intolerance  2. Gastroesophageal reflux disease, esophagitis presence not specified K21.9    continue protonix 40 mg daily, avoid triggering foods, f/up with GI for any dysphagia or choking  3. Pansinusitis, unspecified chronicity J32.4 montelukast (SINGULAIR) 10 MG tablet    amoxicillin-clavulanate (AUGMENTIN) 875-125 MG tablet   tx allergies and tx ABS for worsening sx for 1 month  4. Asthma with acute exacerbation, unspecified asthma severity, unspecified whether persistent J45.901 montelukast (SINGULAIR) 10 MG tablet    levalbuterol (XOPENEX) 0.63 MG/3ML nebulizer solution   in the past tx with Qvar and SABA, may need maintenance inhaler again?  I will not tx with steroids right now due to severe GERD and vomiting - fear that it would make her symptoms worse -lungs are clear without wheeze    I have reviewed Dr.  Pickard's  past office visit - 05/29/17 which addressed chronic cough - he did suspect  laryngo-esophageal + upper airway cough syndrome.  Will get patient treated daily again for asthma, allergic rhinitis, treat worsening sinuses/acute bacterial sinusitis, and continue GERD treatment and follow-up with Dr. Dennard Schaumann her PCP.     Delsa Grana, PA-C 03/05/18 8:41 AM

## 2018-03-05 NOTE — Patient Instructions (Signed)
Start doing your allergy meds daily.   Continue to eat bland foods that do not exacerbate your acid reflux - I would follow up with your GI doctor for your symptoms of choking or having difficulty swallowing.  We will treat sinuses and treat reactive airway/bronchospastic disease (about the same as treating COPD or asthma)   Follow up with Dr. Dennard Schaumann in the next 1-2 weeks so he can recheck.  Since he is your PCP and knows your history, he would be best to recheck you and make the decisions with you about other tests or specialist.

## 2018-03-11 LAB — HM MAMMOGRAPHY

## 2018-03-12 ENCOUNTER — Ambulatory Visit (INDEPENDENT_AMBULATORY_CARE_PROVIDER_SITE_OTHER): Payer: Medicare Other | Admitting: Family Medicine

## 2018-03-12 ENCOUNTER — Encounter: Payer: Self-pay | Admitting: Family Medicine

## 2018-03-12 DIAGNOSIS — R053 Chronic cough: Secondary | ICD-10-CM

## 2018-03-12 DIAGNOSIS — R05 Cough: Secondary | ICD-10-CM

## 2018-03-12 MED ORDER — LEVOCETIRIZINE DIHYDROCHLORIDE 5 MG PO TABS: 5.0000 mg | ORAL_TABLET | Freq: Every evening | ORAL | 0 refills | Status: DC

## 2018-03-12 MED ORDER — FAMOTIDINE 40 MG PO TABS: 40.0000 mg | ORAL_TABLET | Freq: Every day | ORAL | 1 refills | Status: DC

## 2018-03-12 MED ORDER — FLUTICASONE PROPIONATE 50 MCG/ACT NA SUSP: 2.0000 | Freq: Every day | NASAL | 6 refills | Status: DC

## 2018-03-12 MED ORDER — HYDROCODONE-HOMATROPINE 5-1.5 MG/5ML PO SYRP: 5.0000 mL | ORAL_SOLUTION | Freq: Three times a day (TID) | ORAL | 0 refills | Status: DC | PRN

## 2018-03-12 MED FILL — LEVOCETIRIZINE 5 MG TABLET: 5 | 30 days supply | Qty: 30 | Fill #0

## 2018-03-12 MED FILL — FAMOTIDINE 40 MG TABS: 40 | 30 days supply | Qty: 30 | Fill #0

## 2018-03-12 MED FILL — HYDROCODONE-HOMATROPINE SOL: 5-1.5 | 8 days supply | Qty: 120 | Fill #0

## 2018-03-12 MED FILL — FLUTICASONE PROP 50 MCG SPR: 50 | 30 days supply | Qty: 16 | Fill #0

## 2018-03-12 NOTE — Progress Notes (Signed)
Subjective:    Patient ID: Jennifer Wu, female    DOB: 05-07-1940, 77 y.o.   MRN: [id number]  HPI  05/15/16 Lab work obtained at the last visit was unremarkable.  However patient had to discontinue lisinopril due to a worsening cough.  She stopped the lisinopril 2 days ago.  She is here today with her daughter.  They state that she has had a cough for years.  It seemed to worsen with the lisinopril.  The patient states she has a very sensitive gag reflex.  Due to all the coughing, it would constantly trigger her gag reflex and make her vomit.  She is vomiting multiple times a day according to her.  She is lost 5 pounds since her last visit.  In fact she is afraid to eat due to the vomiting.  Therefore I have a patient with 5 pounds weight loss, daily vomiting, and a chronic cough.  She also reports reflux and indigestion almost on a daily basis.  Her blood pressure today remains elevated at 163/97.  At the present time she is only taking amlodipine.  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  Patient could possibly have a hiatal hernia with constant reflux triggering the cough.  This could also explain the indigestion.  The cough seems to be triggering her gag reflex and causing vomiting.  Therefore I will empirically start the patient on Protonix 40 mg a day and recheck the patient in 2 weeks to see if she is noticing improvement.  Given the vomiting and weight loss, I will also consult GI for possible EGD to evaluate for gastric outlet obstruction, hiatal hernia, gastritis.  Patient will continue amlodipine for blood pressure but I will add hydrochlorothiazide 25 mg a day and recheck blood pressure in 2 weeks  05/29/17 She continues to cough.  Cough is some better but not totally better.  Unfortunately she continues to have episodes of nausea and vomiting related to the cough.  Indigestion is much better although she is continues to have acid reflux.   She denies any hemoptysis.  She denies any shortness of breath.  She denies any chest pain.  Blood pressure today is much better although still not at goal.  Studies did not show severe renal artery stenosis.  At that time, my plan was: Given the chronic nature of the cough, I have recommended a chest x-ray.  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  I have recommended increasing Protonix to 40 mg p.o. twice daily as well as adding tramadol 50 mg every 8 hours as needed for cough, nerve irritation in the throat.  Recheck in 2-3 weeks.  Hopefully at that point cough will have improved.  I will tolerate the elevated blood pressure today to allow the patient more time to adjust to her new medication.  If still greater than 140/90 in 3 weeks, will likely need additional medication.   03/12/18 Patient states that after I saw her in March, the cough improved dramatically.  She was able to wean herself off tramadol gradually and was doing well throughout the summer.  However a couple months ago, the cough returned.  It occurred at the end of the fall after she was down at the beach cleaning their beach house which was full of dust and possibly mold.  This led to  a sinusitis with extensive postnasal drip.  Also she had decrease her Protonix to once a day under the care of her gastroenterologist and to discontinue the Adderall.  She was taking Xyzal but she has not been taking any nasal steroid spray.  She was seen by my partner recently and was put on Augmentin for possible sinusitis, started on Singulair for allergies, continued on Protonix, and was given Hycodan.  Symptoms have improved dramatically since taking Hycodan and antibiotics.  She is here today for follow-up.  She is now down to using the Hycodan every other day just for coughing spells.  She denies any  wheezing Past Medical History:  Diagnosis Date  . Arthritis    knees - otc med prn  . Cancer ([place]) 2000-rt lumpectomy-no bp rt arm  . GERD (gastroesophageal reflux disease)   . High cholesterol   . Mixed dyslipidemia   . 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 Seasonal allergies   . Vitamin D deficiency    Past Surgical History:  Procedure Laterality Date  . BACK SURGERY    . BREAST SURGERY    . COLONOSCOPY    . [place]   MAB  . 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 ELBOW SURGERY    . FRACTURE SURGERY  rt foot-1990  . HYSTEROSCOPY W/D&C  06/09/2011   Procedure: DILATATION AND CURETTAGE /HYSTEROSCOPY;  Surgeon: Cheri Fowler, MD;  Location: [facility];  Service: Gynecology;  Laterality: N/A;  . KNEE SURGERY  10/12   right knee  . LUMBAR LAMINECTOMY  1991  . LYMPH NODE BIOPSY     right breast  . RADIAL HEAD IMPLANT  02/01/2011   Procedure: RADIAL HEAD IMPLANT;  Surgeon: Schuyler Amor, MD;  Location: [facility];  Service: Orthopedics;  Laterality: Left;  left radial head replacement  . SVD     x 3   Current Outpatient Medications on File Prior to Visit  Medication Sig Dispense Refill  . amLODipine (NORVASC) 5 MG tablet TAKE 1 TABLET (5 MG TOTAL) BY MOUTH DAILY. 90 tablet 1  . amoxicillin-clavulanate (AUGMENTIN) 875-125 MG tablet Take 1 tablet by mouth 2 (two) times daily for 7 days. 14 tablet 0  . atorvastatin (LIPITOR) 20 MG tablet Take 1 tablet (20 mg total) by mouth daily. 90 tablet 3  . benzonatate (TESSALON) 100 MG capsule Take 1 capsule (100 mg total) by mouth 3 (three) times daily as needed for cough. 30 capsule 1  . Cholecalciferol (VITAMIN D PO) Take 100 Units by mouth daily.    . hydrochlorothiazide (HYDRODIURIL) 25 MG tablet Take 1 tablet (25 mg total) by mouth daily. 90 tablet 3  .  HYDROcodone-homatropine (HYCODAN) 5-1.5 MG/5ML syrup Take 5 mLs by mouth every 8 (eight) hours as needed for cough. 120 mL 0  . ibuprofen (ADVIL,MOTRIN) 200 MG tablet Take 400-600 mg by mouth every 6 (six) hours as needed for headache or moderate pain.    Javier Docker Oil 500 MG CAPS Take 500 mg by mouth daily.    Marland Kitchen levalbuterol (XOPENEX) 0.63 MG/3ML nebulizer solution Take 3 mLs (0.63 mg total) by nebulization every 8 (eight) hours as needed for wheezing or shortness of breath. 3 mL 12  . levocetirizine (XYZAL) 5 MG tablet Take 5 mg by mouth daily.    . montelukast (SINGULAIR) 10  MG tablet Take 1 tablet (10 mg total) by mouth at bedtime. 30 tablet 3  . Multiple Vitamin (MULTIVITAMIN WITH MINERALS) TABS tablet Take 1 tablet by mouth daily.    . pantoprazole (PROTONIX) 40 MG tablet TAKE 1 TABLET BY MOUTH 2 TIMES DAILY. 60 tablet 3  . traMADol (ULTRAM) 50 MG tablet TAKE 1 TABLET BY MOUTH EVERY 8 HOURS 60 tablet 0  . albuterol (PROVENTIL HFA;VENTOLIN HFA) 108 (90 BASE) MCG/ACT inhaler Inhale 2 puffs into the lungs every 6 (six) hours as needed for wheezing. (Patient not taking: Reported on 03/05/2018) 1 Inhaler 11  . beclomethasone (QVAR) 80 MCG/ACT inhaler Inhale 1 puff into the lungs as needed. (Patient not taking: Reported on 03/05/2018) 1 Inhaler 12   No current facility-administered medications on file prior to visit.    Allergies  Allergen Reactions  . Lisinopril Nausea And Vomiting   Social History   Socioeconomic History  . Marital status: Married    Spouse name: Not on file  . Number of children: Not on file  . Years of education: Not on file  . Highest education level: Not on file  Occupational History  . Not on file  Social Needs  . Financial resource strain: Not on file  . Food insecurity:    Worry: Not on file    Inability: Not on file  . Transportation needs:    Medical: Not on file    Non-medical: Not on file  Tobacco Use  . Smoking status: Never Smoker  . Smokeless  tobacco: Never Used  Substance and Sexual Activity  . Alcohol use: No  . Drug use: No  . Sexual activity: Not on file  Lifestyle  . Physical activity:    Days per week: Not on file    Minutes per session: Not on file  . Stress: Not on file  Relationships  . Social connections:    Talks on phone: Not on file    Gets together: Not on file    Attends religious service: Not on file    Active member of club or organization: Not on file    Attends meetings of clubs or organizations: Not on file    Relationship status: Not on file  . Intimate partner violence:    Fear of current or ex partner: Not on file    Emotionally abused: Not on file    Physically abused: Not on file    Forced sexual activity: Not on file  Other Topics Concern  . Not on file  Social History Narrative  . Not on file      Review of Systems  All other systems reviewed and are negative.      Objective:   Physical Exam  Constitutional: She appears well-developed and well-nourished. No distress.  Neck: No JVD present. No thyromegaly present.  Cardiovascular: Normal rate, regular rhythm and intact distal pulses.  Murmur heard. Pulmonary/Chest: Effort normal and breath sounds normal. No respiratory distress. She has no wheezes. She has no rales.  Abdominal: Soft. Bowel sounds are normal. She exhibits no distension. There is no abdominal tenderness. There is no rebound.  Musculoskeletal:        General: Edema present.  Skin: She is not diaphoretic.  Vitals reviewed.         Assessment & Plan:  Chronic cough- I still believe this is multifactorial patient has moderate to severe acid reflux.  She has postnasal drip and potentially allergies and sinusitis.  She also has underlying history  of asthma.  When her cough becomes severe, the patient will cough so hard and so forcefully that she will vomit and turn red and almost pass out.  I believe the violent repetitive nature of her coughing also then causes  upper airway irritation that triggers a cycle of coughing and spasm causing nerve irritation in her throat.  Therefore I believe we need to focus on prevention.  I have recommended continuing Protonix 40 mg a day.  I have recommended adding Pepcid 20 mg a day for acid reflux.  I recommended continuing Xyzal 5 mg a day.  I would discontinue Singulair and replace with Flonase 2 sprays each nostril daily.  I have recommended that the patient continue to wean herself away from opiate cough medication to avoid dependency and habituation but I do believe that the cough medication helps break the cycle of coughing but only exacerbates and prolongs the problem.  If the patient continues to do well recheck in 1 month.  If coughing worsens, I would perform pulmonary function test to evaluate for underlying COPD that may require maintenance therapy

## 2018-03-28 ENCOUNTER — Encounter: Payer: Self-pay | Admitting: *Deleted

## 2018-04-08 ENCOUNTER — Other Ambulatory Visit: Payer: Self-pay | Admitting: Family Medicine

## 2018-04-08 ENCOUNTER — Telehealth: Payer: Self-pay | Admitting: *Deleted

## 2018-04-08 MED FILL — LEVOCETIRIZINE 5 MG TABLET: 5 | 90 days supply | Qty: 90 | Fill #0

## 2018-04-08 MED FILL — PANTOPRAZOLE SOD DR 40 MG T: 40 | 30 days supply | Qty: 60 | Fill #2

## 2018-04-08 NOTE — Telephone Encounter (Signed)
Received fax requesting alternative to Pepcid 40mg .  Advised that medication is currently on backorder.   MD please advise.

## 2018-04-09 NOTE — Telephone Encounter (Signed)
Only other option is ranitidine which has been recalled due to cancer concerns.  Can she get pepcid at [facility] or [facility]?

## 2018-04-11 NOTE — Telephone Encounter (Signed)
Call placed to patient and patient made aware.   Patient states that she has appt in AM with PCP and will discuss at that time.

## 2018-04-12 ENCOUNTER — Encounter: Payer: Self-pay | Admitting: Family Medicine

## 2018-04-12 ENCOUNTER — Ambulatory Visit: Payer: Medicare Other | Admitting: Family Medicine

## 2018-04-12 VITALS — BP 160/98 | HR 98 | Temp 97.8°F | Resp 18 | Ht 64.0 in | Wt 196.0 lb

## 2018-04-12 DIAGNOSIS — I10 Essential (primary) hypertension: Secondary | ICD-10-CM | POA: Diagnosis not present

## 2018-04-12 DIAGNOSIS — R05 Cough: Secondary | ICD-10-CM | POA: Diagnosis not present

## 2018-04-12 DIAGNOSIS — K219 Gastro-esophageal reflux disease without esophagitis: Secondary | ICD-10-CM

## 2018-04-12 DIAGNOSIS — R053 Chronic cough: Secondary | ICD-10-CM

## 2018-04-12 NOTE — Progress Notes (Signed)
Subjective:    Patient ID: Jennifer Wu, female    DOB: 12-05-1940, 78 y.o.   MRN: [id number]  HPI  05/15/16 Lab work obtained at the last visit was unremarkable.  However patient had to discontinue lisinopril due to a worsening cough.  She stopped the lisinopril 2 days ago.  She is here today with her daughter.  They state that she has had a cough for years.  It seemed to worsen with the lisinopril.  The patient states she has a very sensitive gag reflex.  Due to all the coughing, it would constantly trigger her gag reflex and make her vomit.  She is vomiting multiple times a day according to her.  She is lost 5 pounds since her last visit.  In fact she is afraid to eat due to the vomiting.  Therefore I have a patient with 5 pounds weight loss, daily vomiting, and a chronic cough.  She also reports reflux and indigestion almost on a daily basis.  Her blood pressure today remains elevated at 163/97.  At the present time she is only taking amlodipine.  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  Patient could possibly have a hiatal hernia with constant reflux triggering the cough.  This could also explain the indigestion.  The cough seems to be triggering her gag reflex and causing vomiting.  Therefore I will empirically start the patient on Protonix 40 mg a day and recheck the patient in 2 weeks to see if she is noticing improvement.  Given the vomiting and weight loss, I will also consult GI for possible EGD to evaluate for gastric outlet obstruction, hiatal hernia, gastritis.  Patient will continue amlodipine for blood pressure but I will add hydrochlorothiazide 25 mg a day and recheck blood pressure in 2 weeks  05/29/17 She continues to cough.  Cough is some better but not totally better.  Unfortunately she continues to have episodes of nausea and vomiting related to the cough.  Indigestion is much better although she is continues to have acid reflux.   She denies any hemoptysis.  She denies any shortness of breath.  She denies any chest pain.  Blood pressure today is much better although still not at goal.  Studies did not show severe renal artery stenosis.  At that time, my plan was: Given the chronic nature of the cough, I have recommended a chest x-ray.  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  I have recommended increasing Protonix to 40 mg p.o. twice daily as well as adding tramadol 50 mg every 8 hours as needed for cough, nerve irritation in the throat.  Recheck in 2-3 weeks.  Hopefully at that point cough will have improved.  I will tolerate the elevated blood pressure today to allow the patient more time to adjust to her new medication.  If still greater than 140/90 in 3 weeks, will likely need additional medication.   03/12/18 Patient states that after I saw her in March, the cough improved dramatically.  She was able to wean herself off tramadol gradually and was doing well throughout the summer.  However a couple months ago, the cough returned.  It occurred at the end of the fall after she was down at the beach cleaning their beach house which was full of dust and possibly mold.  This led to  a sinusitis with extensive postnasal drip.  Also she had decrease her Protonix to once a day under the care of her gastroenterologist and to discontinue the Tramadol.  She was taking Xyzal but she has not been taking any nasal steroid spray.  She was seen by my partner recently and was put on Augmentin for possible sinusitis, started on Singulair for allergies, continued on Protonix, and was given Hycodan.  Symptoms have improved dramatically since taking Hycodan and antibiotics.  She is here today for follow-up.  She is now down to using the Hycodan every other day just for coughing spells.  She denies any wheezing.   At that time, my plan was: I still believe this is multifactorial patient has moderate to severe acid reflux.  She has postnasal drip and potentially allergies and sinusitis.  She also has underlying history of asthma.  When her cough becomes severe, the patient will cough so hard and so forcefully that she will vomit and turn red and almost pass out.  I believe the violent repetitive nature of her coughing also then causes upper airway irritation that triggers a cycle of coughing and spasm causing nerve irritation in her throat.  Therefore I believe we need to focus on prevention.  I have recommended continuing Protonix 40 mg a day.  I have recommended adding Pepcid 20 mg a day for acid reflux.  I recommended continuing Xyzal 5 mg a day.  I would discontinue Singulair and replace with Flonase 2 sprays each nostril daily.  I have recommended that the patient continue to wean herself away from opiate cough medication to avoid dependency and habituation but I do believe that the cough medication helps break the cycle of coughing but only exacerbates and prolongs the problem.  If the patient continues to do well recheck in 1 month.  If coughing worsens, I would perform pulmonary function test to evaluate for underlying COPD that may require maintenance therapy  04/12/18 Patient seems to have worsened.  She is coughed at least 7 times while the nurses checking her in.  Coughing spells are still extremely exaggerated and often lead to posttussive emesis.  Patient thinks she was doing better when she was taking Singulair prior to the Flonase.  She denies any shortness of breath.  She denies any wheezing.  She denies any chest pain or fevers or hemoptysis.  She denies any pleurisy.  On pulmonary exam today, her lungs are here to auscultation.  I hear no wheezes crackles or rails.  She is compliant with both Protonix and Pepcid.  She is taking Xyzal and Flonase.  She does have Xopenex that she uses on an as-needed basis.   However she tends to use Xopenex only when her coughing spells are so bad that she starts gasping for breath.  She is on no preventative.  I see no history of pulmonary function test in her chart Past Medical History:  Diagnosis Date  . Arthritis    knees - otc med prn  . Cancer ([place]) 2000-rt lumpectomy-no bp rt arm  . GERD (gastroesophageal reflux disease)   . High cholesterol   . Mixed dyslipidemia   . 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 Seasonal allergies   . Vitamin D deficiency    Past Surgical History:  Procedure Laterality Date  . BACK SURGERY    . BREAST SURGERY    . COLONOSCOPY    .  [place]   MAB  . 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 ELBOW SURGERY    . FRACTURE SURGERY  rt foot-1990  . HYSTEROSCOPY W/D&C  06/09/2011   Procedure: DILATATION AND CURETTAGE /HYSTEROSCOPY;  Surgeon: Cheri Fowler, MD;  Location: [facility];  Service: Gynecology;  Laterality: N/A;  . KNEE SURGERY  10/12   right knee  . LUMBAR LAMINECTOMY  1991  . LYMPH NODE BIOPSY     right breast  . RADIAL HEAD IMPLANT  02/01/2011   Procedure: RADIAL HEAD IMPLANT;  Surgeon: Schuyler Amor, MD;  Location: [facility];  Service: Orthopedics;  Laterality: Left;  left radial head replacement  . SVD     x 3   Current Outpatient Medications on File Prior to Visit  Medication Sig Dispense Refill  . albuterol (PROVENTIL HFA;VENTOLIN HFA) 108 (90 BASE) MCG/ACT inhaler Inhale 2 puffs into the lungs every 6 (six) hours as needed for wheezing. (Patient not taking: Reported on 03/05/2018) 1 Inhaler 11  . amLODipine (NORVASC) 5 MG tablet TAKE 1 TABLET (5 MG TOTAL) BY MOUTH DAILY. 90 tablet 1  . atorvastatin (LIPITOR) 20 MG tablet Take 1 tablet (20 mg total) by mouth daily. 90 tablet 3  . beclomethasone (QVAR) 80 MCG/ACT inhaler Inhale 1 puff into  the lungs as needed. (Patient not taking: Reported on 03/05/2018) 1 Inhaler 12  . benzonatate (TESSALON) 100 MG capsule Take 1 capsule (100 mg total) by mouth 3 (three) times daily as needed for cough. 30 capsule 1  . Cholecalciferol (VITAMIN D PO) Take 100 Units by mouth daily.    . famotidine (PEPCID) 40 MG tablet Take 1 tablet (40 mg total) by mouth daily. 30 tablet 1  . fluticasone (FLONASE) 50 MCG/ACT nasal spray Place 2 sprays into both nostrils daily. 16 g 6  . hydrochlorothiazide (HYDRODIURIL) 25 MG tablet Take 1 tablet (25 mg total) by mouth daily. 90 tablet 3  . HYDROcodone-homatropine (HYCODAN) 5-1.5 MG/5ML syrup Take 5 mLs by mouth every 8 (eight) hours as needed for cough. 120 mL 0  . ibuprofen (ADVIL,MOTRIN) 200 MG tablet Take 400-600 mg by mouth every 6 (six) hours as needed for headache or moderate pain.    Javier Docker Oil 500 MG CAPS Take 500 mg by mouth daily.    Marland Kitchen levalbuterol (XOPENEX) 0.63 MG/3ML nebulizer solution Take 3 mLs (0.63 mg total) by nebulization every 8 (eight) hours as needed for wheezing or shortness of breath. 3 mL 12  . levocetirizine (XYZAL) 5 MG tablet Take 5 mg by mouth daily.    Marland Kitchen levocetirizine (XYZAL) 5 MG tablet TAKE 1 TABLET BY MOUTH EVERY EVENING. 90 tablet 3  . montelukast (SINGULAIR) 10 MG tablet Take 1 tablet (10 mg total) by mouth at bedtime. 30 tablet 3  . Multiple Vitamin (MULTIVITAMIN WITH MINERALS) TABS tablet Take 1 tablet by mouth daily.    . pantoprazole (PROTONIX) 40 MG tablet TAKE 1 TABLET BY MOUTH 2 TIMES DAILY. 60 tablet 3  . traMADol (ULTRAM) 50 MG tablet TAKE 1 TABLET BY MOUTH EVERY 8 HOURS 60 tablet 0   No current facility-administered medications on file prior to visit.    Allergies  Allergen Reactions  . Lisinopril Nausea And Vomiting   Social History   Socioeconomic History  . Marital status: Married    Spouse name: Not on file  . Number of children: Not on file  .  Years of education: Not on file  . Highest education level:  Not on file  Occupational History  . Not on file  Social Needs  . Financial resource strain: Not on file  . Food insecurity:    Worry: Not on file    Inability: Not on file  . Transportation needs:    Medical: Not on file    Non-medical: Not on file  Tobacco Use  . Smoking status: Never Smoker  . Smokeless tobacco: Never Used  Substance and Sexual Activity  . Alcohol use: No  . Drug use: No  . Sexual activity: Not on file  Lifestyle  . Physical activity:    Days per week: Not on file    Minutes per session: Not on file  . Stress: Not on file  Relationships  . Social connections:    Talks on phone: Not on file    Gets together: Not on file    Attends religious service: Not on file    Active member of club or organization: Not on file    Attends meetings of clubs or organizations: Not on file    Relationship status: Not on file  . Intimate partner violence:    Fear of current or ex partner: Not on file    Emotionally abused: Not on file    Physically abused: Not on file    Forced sexual activity: Not on file  Other Topics Concern  . Not on file  Social History Narrative  . Not on file      Review of Systems  All other systems reviewed and are negative.      Objective:   Physical Exam  Constitutional: She appears well-developed and well-nourished. No distress.  Neck: No JVD present. No thyromegaly present.  Cardiovascular: Normal rate, regular rhythm and intact distal pulses.  Murmur heard. Pulmonary/Chest: Effort normal and breath sounds normal. No respiratory distress. She has no wheezes. She has no rales.  Abdominal: Soft. Bowel sounds are normal. She exhibits no distension. There is no abdominal tenderness. There is no rebound.  Musculoskeletal:        General: No edema.  Skin: She is not diaphoretic.  Vitals reviewed.         Assessment & Plan:   Benign essential HTN  Chronic cough  Gastroesophageal reflux disease, esophagitis presence not  specified  I still believe the chronic cough is multifactorial.  At this point I would perform pulmonary function test to evaluate for underlying obstructive lung disease and consult pulmonology.  Patient may benefit from an inhaled steroid particularly if there is an obstructive pattern on her pulmonary function test.  PFTs show an FEV1 of 2.3 L which is 118% of predicted.  Shows an FVC of 2.86 L which is 107% of predicted.  Her FEV1 to FVC ratio is 81% which is normal.  There is no evidence of an obstructive or restrictive pattern.  Therefore him on a consult pulmonology regarding her current cough.  Chronic and likely irritant in nature.  I will empirically try the patient on Asmanex 220 mcg inhaled once daily and I gave the patient samples to last 2 weeks in case this is some type of cough variant asthma.  If cough is not improving at that point I have exhausted what I know to try and I would recommend pulmonology expert opinion.

## 2018-04-25 ENCOUNTER — Telehealth: Payer: Self-pay | Admitting: Family Medicine

## 2018-04-25 MED FILL — FAMOTIDINE 40 MG TABLET: 40 | 30 days supply | Qty: 30 | Fill #1

## 2018-04-25 NOTE — Telephone Encounter (Signed)
Patient called in states that the sample of asmanex she states that it worked well. She hasn't gotten an appointment yet with the pulmonologist. She would like to know if you can give her another sample or call in a prescription for asmanex.  CB# [phone]

## 2018-04-26 ENCOUNTER — Other Ambulatory Visit: Payer: Self-pay | Admitting: Family Medicine

## 2018-04-26 NOTE — Telephone Encounter (Signed)
Ok asmanex 1 inh qday

## 2018-04-29 ENCOUNTER — Ambulatory Visit: Payer: Medicare Other | Admitting: Internal Medicine

## 2018-04-29 ENCOUNTER — Encounter: Payer: Self-pay | Admitting: Internal Medicine

## 2018-04-29 VITALS — BP 124/82 | HR 78 | Ht 65.5 in | Wt 203.0 lb

## 2018-04-29 DIAGNOSIS — J45991 Cough variant asthma: Secondary | ICD-10-CM

## 2018-04-29 LAB — NITRIC OXIDE: Nitric Oxide: 11

## 2018-04-29 MED ORDER — MOMETASONE FUROATE 100 MCG/ACT IN AERO: 2.0000 | INHALATION_SPRAY | Freq: Two times a day (BID) | RESPIRATORY_TRACT | 0 refills | Status: DC

## 2018-04-29 NOTE — Progress Notes (Signed)
Jennifer Wu, female    DOB: 1940-05-23,    MRN: [id number]   Brief patient profile:  36 yowf never smoker acutely ill  1964 hosp x 2 weeks for dx pna with cough with throat clearing  ever since to point of vomiting so referred to pulmonary clinic 04/29/2018 by Jennifer Wu with h/o cough much worse on ACEi but some better on singulair and asmanex dpi.    History of Present Illness   04/29/2018  Pulmonary/ 1st office eval/Jennifer Wu  Chief Complaint  Patient presents with  . Consult    Chronic Cough  Dyspnea:  Not limited by breathing from desired activities   Cough: mostly dry  Day > noct  Sleep: on side bed flat / nose clogs up  SABA use: no better  Using lots of mints   No obvious day to day or daytime patterns to variability or assoc excess/ purulent sputum or mucus plugs or hemoptysis or cp or chest tightness, subjective wheeze or overt sinus or hb symptoms.   Sleeping as above without nocturnal  or early am exacerbation  of respiratory  c/o's or need for noct saba. Also denies any obvious fluctuation of symptoms with weather or environmental changes or other aggravating or alleviating factors except as outlined above   No unusual exposure hx or h/o childhood pna/ asthma or knowledge of premature birth.  Current Allergies, Complete Past Medical History, Past Surgical History, Family History, and Social History were reviewed in [facility] record.  ROS  The following are not active complaints unless bolded Hoarseness, sore throat, dysphagia, dental problems, itching, sneezing,  nasal congestion or discharge of excess mucus or purulent secretions, ear ache,   fever, chills, sweats, unintended wt loss or wt gain, classically pleuritic or exertional cp,  orthopnea pnd or arm/hand swelling  or leg swelling, presyncope, palpitations, abdominal pain, anorexia, nausea, vomiting, diarrhea  or change in bowel habits or change in bladder habits, change in stools or change  in urine, dysuria, hematuria,  rash, arthralgias, visual complaints, headache, numbness, weakness or ataxia or problems with walking or coordination,  change in mood or  memory.             Past Medical History:  Diagnosis Date  . Arthritis    knees - otc med prn  . Cancer ([place]) 2000-rt lumpectomy-no bp rt arm  . GERD (gastroesophageal reflux disease)   . High cholesterol   . Mixed dyslipidemia   . 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 Seasonal allergies   . Vitamin D deficiency     Outpatient Medications Prior to Visit  Medication Sig Dispense Refill  . amLODipine (NORVASC) 5 MG tablet TAKE 1 TABLET (5 MG TOTAL) BY MOUTH DAILY. 90 tablet 1  . atorvastatin (LIPITOR) 20 MG tablet Take 1 tablet (20 mg total) by mouth daily. 90 tablet 3  . Cholecalciferol (VITAMIN D PO) Take 1,000 Units by mouth daily.     . famotidine (PEPCID) 40 MG tablet Take 1 tablet (40 mg total) by mouth daily. 30 tablet 1  . fluticasone (FLONASE) 50 MCG/ACT nasal spray Place 2 sprays into both nostrils daily. 16 g 6  . hydrochlorothiazide (HYDRODIURIL) 25 MG tablet Take 1 tablet (25 mg total) by mouth daily. 90 tablet 3  . HYDROcodone-homatropine (HYCODAN) 5-1.5 MG/5ML syrup Take 5 mLs by mouth every 8 (eight) hours as needed for cough. 120 mL 0  .  ibuprofen (ADVIL,MOTRIN) 200 MG tablet Take 400-600 mg by mouth every 6 (six) hours as needed for headache or moderate pain.    Marland Kitchen levalbuterol (XOPENEX) 0.63 MG/3ML nebulizer solution Take 3 mLs (0.63 mg total) by nebulization every 8 (eight) hours as needed for wheezing or shortness of breath. 3 mL 12  . levocetirizine (XYZAL) 5 MG tablet Take 5 mg by mouth daily.    . mometasone (ASMANEX) 220 MCG/INH inhaler Inhale 2 puffs into the lungs daily. sample    . montelukast (SINGULAIR) 10 MG tablet Take 1 tablet (10 mg total) by mouth at bedtime. 30 tablet 3  . pantoprazole  (PROTONIX) 40 MG tablet TAKE 1 TABLET BY MOUTH 2 TIMES DAILY. 60 tablet 3  . traMADol (ULTRAM) 50 MG tablet TAKE 1 TABLET BY MOUTH EVERY 8 HOURS 60 tablet 0         Objective:     BP 124/82 (BP Location: Left Arm, Patient Position: Sitting, Cuff Size: Normal)   Pulse 78   Ht 5' 5.5" (1.664 m)   Wt 203 lb (92.1 kg)   SpO2 93%   BMI 33.27 kg/m   SpO2: 93 %  RA  Elderly amb wf nad   HEENT: nl dentition, turbinates bilaterally, and oropharynx. Nl external ear canals without cough reflex   NECK :  without JVD/Nodes/TM/ nl carotid upstrokes bilaterally   LUNGS: no acc muscle use,  Nl contour chest which is clear to A and P bilaterally without cough on insp or exp maneuvers   CV:  RRR  no s3 or murmur or increase in P2, and no edema   ABD:  soft and nontender with nl inspiratory excursion in the supine position. No bruits or organomegaly appreciated, bowel sounds nl  MS:  Nl gait/ ext warm without deformities, calf tenderness, cyanosis or clubbing No obvious joint restrictions   SKIN: warm and dry without lesions    NEURO:  alert, approp, nl sensorium with  no motor or cerebellar deficits apparent.     I personally reviewed images and agree with radiology impression as follows:  CXR:   05/29/17  No active cardiopulmonary disease.      Assessment   Cough variant asthma vs UACS Onset p probable CAP 1964 - partial resp to singulair then much better on asmanex  - Spirometry 04/29/2018  Completely nl - FENO 04/29/2018  =   11 off all rx  - 04/29/2018  After extensive coaching inhaler device,  effectiveness =    75% with hfa so try asmanex 100 2bid x 4 week sample then return  ddx is between cough variant asthma and Upper airway cough syndrome (previously labeled PNDS),  is so named because it's frequently impossible to sort out how much is  CR/sinusitis with freq throat clearing (which can be related to primary GERD)   vs  causing  secondary (" extra esophageal")  GERD from  wide swings in gastric pressure that occur with throat clearing, often  promoting self use of mint and menthol lozenges that reduce the lower esophageal sphincter tone and exacerbate the problem further in a cyclical fashion.   These are the same pts (now being labeled as having "irritable larynx syndrome" by some cough centers) who not infrequently have a history of having failed to tolerate ace inhibitors,  dry powder inhalers (esp p the first few weeks of using successfully as may have been the case here)  or biphosphonates or report having atypical/extraesophageal reflux symptoms that don't respond to  standard doses of PPI  and are easily confused as having aecopd or asthma flares by even experienced allergists/ pulmonologists (myself included).    >>> will start with rx for both with hfa asmanex then add on singulair next and rx gerd with diet and continue bid ppi but be sure to use 30-60 min ac.      Total time devoted to counseling  > 50 % of initial 60 min office visit:  review case with pt/  device teaching which extended face to face time for this visit / discussion of options/alternatives/ personally creating written customized instructions  in presence of pt  then going over those specific  Instructions directly with the pt including how to use all of the meds but in particular covering each new medication in detail and the difference between the maintenance= "automatic" meds and the prns using an action plan format for the latter (If this problem/symptom => do that organization reading Left to right).  Please see AVS from this visit for a full list of these instructions which I personally wrote for this pt and  are unique to this visit.      Christinia Gully, MD 04/29/2018

## 2018-04-29 NOTE — Patient Instructions (Addendum)
Plan A = Automatic = asmanex 100 Take 2 puffs first thing in am and then another 2 puffs about 12 hours later.    Work on inhaler technique:  relax and gently blow all the way out then take a nice smooth deep breath back in, triggering the inhaler at same time you start breathing in.  Hold for up to 5 seconds if you can. Blow out thru nose. Rinse and gargle with water when done     Plan B = Backup Only use your levoalbuterol  as a rescue medication for breathing or coughiung  - The less you use it, the better it will work when you need it. - Ok to use the inhaler up to every 4 hours if you must but call for appointment if use goes up over your usual need   GERD (REFLUX)  is an extremely common cause of respiratory symptoms just like yours , many times with no obvious heartburn at all.    It can be treated with medication, but also with lifestyle changes including elevation of the head of your bed (ideally with 6 -8inch blocks under the headboard of your bed),  Smoking cessation, avoidance of late meals, excessive alcohol, and avoid fatty foods, chocolate, peppermint, colas, red wine, and acidic juices such as orange juice.  NO MINT OR MENTHOL PRODUCTS SO NO COUGH DROPS  USE SUGARLESS CANDY INSTEAD (Jolley ranchers or Stover's or Life Savers) or even ice chips will also do - the key is to swallow to prevent all throat clearing. NO OIL BASED VITAMINS - use powdered substitutes.  Avoid fish oil when coughing.   Please schedule a follow up office visit in 4 weeks, sooner if needed

## 2018-04-29 NOTE — Telephone Encounter (Signed)
Seen in pulmonologist office today. 04/29/2018

## 2018-04-30 ENCOUNTER — Encounter: Payer: Self-pay | Admitting: Internal Medicine

## 2018-04-30 DIAGNOSIS — J45991 Cough variant asthma: Secondary | ICD-10-CM | POA: Insufficient documentation

## 2018-04-30 NOTE — Assessment & Plan Note (Addendum)
Onset p probable CAP 1964 - partial resp to singulair then much better on asmanex  - Spirometry 04/29/2018  Completely nl - FENO 04/29/2018  =   11 off all rx  - 04/29/2018  After extensive coaching inhaler device,  effectiveness =    75% with hfa so try asmanex 100 2bid x 4 week sample then return  ddx is between cough variant asthma and Upper airway cough syndrome (previously labeled PNDS),  is so named because it's frequently impossible to sort out how much is  CR/sinusitis with freq throat clearing (which can be related to primary GERD)   vs  causing  secondary (" extra esophageal")  GERD from wide swings in gastric pressure that occur with throat clearing, often  promoting self use of mint and menthol lozenges that reduce the lower esophageal sphincter tone and exacerbate the problem further in a cyclical fashion.   These are the same pts (now being labeled as having "irritable larynx syndrome" by some cough centers) who not infrequently have a history of having failed to tolerate ace inhibitors,  dry powder inhalers (esp p the first few weeks of using successfully as may have been the case here)  or biphosphonates or report having atypical/extraesophageal reflux symptoms that don't respond to standard doses of PPI  and are easily confused as having aecopd or asthma flares by even experienced allergists/ pulmonologists (myself included).    >>> will start with rx for both with hfa asmanex then add on singulair next and rx gerd with diet and continue bid ppi but be sure to use 30-60 min ac.   Total time devoted to counseling  > 50 % of initial 60 min office visit:  review case with pt/  device teaching which extended face to face time for this visit / discussion of options/alternatives/ personally creating written customized instructions  in presence of pt  then going over those specific  Instructions directly with the pt including how to use all of the meds but in particular covering each new medication  in detail and the difference between the maintenance= "automatic" meds and the prns using an action plan format for the latter (If this problem/symptom => do that organization reading Left to right).  Please see AVS from this visit for a full list of these instructions which I personally wrote for this pt and  are unique to this visit.

## 2018-05-10 MED FILL — traMADol HCL 50 MG TABS: 50 | 7 days supply | Qty: 21 | Fill #1 | Status: TO

## 2018-05-13 ENCOUNTER — Other Ambulatory Visit: Payer: Self-pay | Admitting: Family Medicine

## 2018-05-13 MED FILL — HYDROCHLOROTHIAZIDE 25 MG T: 25 | 90 days supply | Qty: 90 | Fill #0 | Status: TO

## 2018-05-16 ENCOUNTER — Other Ambulatory Visit: Payer: Self-pay | Admitting: *Deleted

## 2018-05-16 MED ORDER — AMLODIPINE BESYLATE 5 MG PO TABS: 5.0000 mg | ORAL_TABLET | Freq: Every day | ORAL | 1 refills | Status: DC

## 2018-05-16 MED FILL — AMLODIPINE BESYLATE 5 MG TA: 5 | 90 days supply | Qty: 90 | Fill #0

## 2018-05-21 ENCOUNTER — Other Ambulatory Visit: Payer: Self-pay | Admitting: Family Medicine

## 2018-05-21 MED FILL — FLUTICASONE PROP 50 MCG SPR: 50 | 30 days supply | Qty: 16 | Fill #1

## 2018-05-21 MED FILL — ATORVASTATIN 20 MG TABLET: 20 | 90 days supply | Qty: 90 | Fill #0 | Status: TO

## 2018-05-27 ENCOUNTER — Other Ambulatory Visit: Payer: Self-pay | Admitting: Family Medicine

## 2018-05-27 MED FILL — PANTOPRAZOLE SOD DR 40 MG T: 40 | 30 days supply | Qty: 60 | Fill #3

## 2018-05-27 MED FILL — FAMOTIDINE 40 MG TABLET: 40 | 90 days supply | Qty: 90 | Fill #0 | Status: TO

## 2018-05-28 ENCOUNTER — Ambulatory Visit: Payer: Medicare Other | Admitting: Internal Medicine

## 2018-05-28 ENCOUNTER — Encounter: Payer: Self-pay | Admitting: Internal Medicine

## 2018-05-28 VITALS — BP 142/86 | HR 93 | Ht 66.0 in | Wt 198.0 lb

## 2018-05-28 DIAGNOSIS — J45991 Cough variant asthma: Secondary | ICD-10-CM | POA: Diagnosis not present

## 2018-05-28 LAB — CBC WITH DIFFERENTIAL/PLATELET
Basophils Absolute: 0 10*3/uL (ref 0.0–0.1)
Basophils Relative: 0.8 % (ref 0.0–3.0)
Eosinophils Absolute: 0.2 10*3/uL (ref 0.0–0.7)
Eosinophils Relative: 3.5 % (ref 0.0–5.0)
HCT: 42 % (ref 36.0–46.0)
Hemoglobin: 13.6 g/dL (ref 12.0–15.0)
Lymphocytes Relative: 34.6 % (ref 12.0–46.0)
Lymphs Abs: 2 10*3/uL (ref 0.7–4.0)
MCHC: 32.3 g/dL (ref 30.0–36.0)
MCV: 85.5 fl (ref 78.0–100.0)
Monocytes Absolute: 0.4 10*3/uL (ref 0.1–1.0)
Monocytes Relative: 7.6 % (ref 3.0–12.0)
Neutro Abs: 3.1 10*3/uL (ref 1.4–7.7)
Neutrophils Relative %: 53.5 % (ref 43.0–77.0)
Platelets: 259 10*3/uL (ref 150.0–400.0)
RBC: 4.92 Mil/uL (ref 3.87–5.11)
RDW: 13.6 % (ref 11.5–15.5)
WBC: 5.8 10*3/uL (ref 4.0–10.5)

## 2018-05-28 MED ORDER — MOMETASONE FUROATE 200 MCG/ACT IN AERO: 2.0000 | INHALATION_SPRAY | Freq: Every day | RESPIRATORY_TRACT | 0 refills | Status: DC

## 2018-05-28 MED ORDER — MONTELUKAST SODIUM 10 MG PO TABS: 10.0000 mg | ORAL_TABLET | Freq: Every day | ORAL | 11 refills | Status: DC

## 2018-05-28 MED FILL — MONTELUKAST SOD 10 MG TAB: 10 | 30 days supply | Qty: 30 | Fill #0 | Status: TO

## 2018-05-28 NOTE — Progress Notes (Signed)
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    History of Present Illness  04/29/2018  Pulmonary/ 1st office eval/Jennifer Wu  Chief Complaint  Patient presents with  . Consult    Chronic Cough  Dyspnea:  Not limited by breathing from desired activities   Cough: mostly dry  Day > noct  Sleep: on side bed flat / nose clogs up  SABA use: no better  Using lots of mints  rec Plan A = Automatic = asmanex 100 Take 2 puffs first thing in am and then another 2 puffs about 12 hours later.  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 Follow-up    States her cough has gotten better but still there. Would like refill of hycodan to have on stand by.   Dyspnea:  Not limited by breathing from desired activities   Cough: worse when heads hits pillow / worse with asmanex and with cornbread / no bettter with tessalon or delsym - note this is not the hx she gave 04/29/2018 which was day > noct  Sleeping: flat bed  SABA use: xopenex ? Helping /rarely uses  02: none  Tends to ad lib on maint rx    No obvious day to day or daytime variability or assoc excess/ purulent sputum or mucus plugs or hemoptysis or cp or chest tightness, subjective wheeze or overt sinus or hb symptoms.     Also denies any obvious fluctuation of symptoms with weather or environmental changes or other aggravating or alleviating factors except as outlined above    No unusual exposure hx or h/o childhood pna/ asthma or knowledge of premature birth.  Current Allergies, Complete Past Medical History, Past Surgical History, Family History, and Social History were reviewed in [facility] record.  ROS  The following are not active complaints unless bolded Hoarseness, sore throat, dysphagia, dental problems, itching, sneezing,  nasal congestion or discharge of excess mucus or purulent secretions, ear ache,   fever, chills, sweats, unintended wt loss or wt gain, classically pleuritic or exertional cp,  orthopnea pnd or arm/hand swelling  or leg swelling, presyncope, palpitations, abdominal pain, anorexia, nausea, vomiting, diarrhea  or change in bowel habits or change in bladder habits, change in stools or change in urine, dysuria, hematuria,  rash, arthralgias, visual complaints, headache, numbness, weakness or ataxia or problems with walking or coordination,  change in mood or  memory.        Current Meds  Medication Sig  . amLODipine (NORVASC) 5 MG tablet Take 1 tablet (5 mg total) by mouth daily.  Marland Kitchen atorvastatin (LIPITOR) 20 MG tablet TAKE 1 TABLET (20 MG TOTAL) BY MOUTH DAILY.  Marland Kitchen Cholecalciferol (VITAMIN D PO) Take 1,000 Units by mouth daily.   . famotidine (PEPCID) 40 MG tablet TAKE  1 TABLET BY MOUTH DAILY.  . fluticasone (FLONASE) 50 MCG/ACT nasal spray Place 2 sprays into both nostrils daily.  . hydrochlorothiazide (HYDRODIURIL) 25 MG tablet TAKE 1 TABLET (25 MG TOTAL) BY MOUTH DAILY.  Marland Kitchen HYDROcodone-homatropine (HYCODAN) 5-1.5 MG/5ML syrup Take 5 mLs by mouth every 8 (eight) hours as needed for cough.  Marland Kitchen ibuprofen (ADVIL,MOTRIN) 200 MG tablet Take 400-600 mg by mouth every 6 (six) hours as needed for headache or moderate pain.  Marland Kitchen levalbuterol (XOPENEX) 0.63 MG/3ML nebulizer solution Take 3 mLs (0.63 mg total) by nebulization every 8 (eight) hours as needed for wheezing or shortness of breath.  . levocetirizine (XYZAL) 5 MG tablet  Take 5 mg by mouth daily.  .     . Mometasone Furoate (ASMANEX HFA) 100 MCG/ACT AERO Inhale 2 puffs into the lungs 2 (two) times daily.  . pantoprazole (PROTONIX) 40 MG tablet TAKE 1 TABLET BY MOUTH 2 TIMES DAILY.  . traMADol (ULTRAM) 50 MG tablet TAKE 1 TABLET BY MOUTH EVERY 8 HOURS              Objective:    amb wf nad difficult historian/ freq throat clearing/ voice fatigue     Wt Readings from Last 3 Encounters:  05/28/18 198 lb (89.8 kg)  04/29/18 203 lb (92.1 kg)  04/12/18 196 lb (88.9 kg)     Vital signs reviewed - Note on arrival 02 sats  95% on RA      HEENT: nl dentition, turbinates bilaterally, and oropharynx. Nl external ear canals without cough reflex   NECK :  without JVD/Nodes/TM/ nl carotid upstrokes bilaterally   LUNGS: no acc muscle use,  Nl contour chest which is clear to A and P bilaterally without cough on insp or exp maneuvers   CV:  RRR  no s3 or murmur or increase in P2, and no edema   ABD:  soft and nontender with nl inspiratory excursion in the supine position. No bruits or organomegaly appreciated, bowel sounds nl  MS:  Nl gait/ ext warm without deformities, calf tenderness, cyanosis or clubbing No obvious joint restrictions   SKIN: warm and dry without lesions    NEURO:  alert, approp, nl sensorium with  no motor or cerebellar deficits apparent.       Assessment   Cough variant asthma vs UACS Onset p probable CAP 1964 - partial resp to singulair then much better on asmanex  - Spirometry 04/29/2018  Completely nl - FENO 04/29/2018  =   11 off all rx  - 04/29/2018  After extensive coaching inhaler device,  effectiveness =    75% with hfa so try asmanex 100 2bid x 4 week sample then return  ddx is between cough variant asthma and Upper airway cough syndrome (previously labeled PNDS),  is so named because it's frequently impossible to sort out how much is  CR/sinusitis with freq throat clearing (which can be related to primary GERD)   vs   causing  secondary (" extra esophageal")  GERD from wide swings in gastric pressure that occur with throat clearing, often  promoting self use of mint and menthol lozenges that reduce the lower esophageal sphincter tone and exacerbate the problem further in a cyclical fashion.   These are the same pts (now being labeled as having "irritable larynx syndrome" by some cough centers) who not infrequently have a history of having failed to tolerate ace inhibitors,  dry powder inhalers (esp p the first few weeks of using successfully as  may have been the case here)  or biphosphonates or report having atypical/extraesophageal reflux symptoms that don't respond to standard doses of PPI  and are easily confused as having aecopd or asthma flares by even experienced allergists/ pulmonologists (myself included).    >>> will start with rx for both with hfa asmanex then add on singulair next and rx gerd with diet and continue bid ppi but be sure to use 30-60 min ac.

## 2018-05-28 NOTE — Patient Instructions (Addendum)
For drainage / throat tickle try take CHLORPHENIRAMINE  4 mg (chlortabs 4 mg) - take one every 4 hours as needed - available over the counter- may cause drowsiness so take 1 or 2 1 hours before bedtime   and see how you tolerate it before trying in daytime     Pantoprazole (protonix) 40 mg   Take  30-60 min before first meal of the day and Pepcid (famotidine)  20 mg one @  bedtime until return to office - this is the best way to tell whether stomach acid is contributing to your problem.    Stop night time asmanex  and restart singulair 10 mg each pm    If continue to cough after a week on the combination, then stop the asmanex 200  and continue singulair  If get worse > xopenex is as needed    Please remember to go to the lab department   for your tests - we will call you with the results when they are available.      Please schedule a follow up office visit in 4 weeks, call sooner if needed with all medications /inhalers/ solutions in hand so we can verify exactly what you are taking. This includes all medications from all doctors and over the [place] separate them into two bags:  the ones you take automatically, no matter what, vs the ones you take just when you feel you need them "BAG #2 is UP TO YOU"  - this will really help [place] help you take your medications more effectively.

## 2018-05-29 ENCOUNTER — Encounter: Payer: Self-pay | Admitting: Internal Medicine

## 2018-05-29 LAB — RESPIRATORY ALLERGY PROFILE REGION II ~~LOC~~
Allergen, A. alternata, m6: <0.1 kU/L
Allergen, Cedar tree, t12: <0.1 kU/L
Allergen, Comm Silver Birch, t9: <0.1 kU/L
Allergen, Cottonwood, t14: <0.1 kU/L
Allergen, D pternoyssinus,d7: 0.38 kU/L — ABNORMAL HIGH
Allergen, Mouse Urine Protein, e78: <0.1 kU/L
Allergen, Mulberry, t76: <0.1 kU/L
Allergen, Oak,t7: <0.1 kU/L
Allergen, P. notatum, m1: <0.1 kU/L
Aspergillus fumigatus, m3: <0.1 kU/L
Bermuda Grass: <0.1 kU/L
Box Elder IgE: <0.1 kU/L
CLADOSPORIUM HERBARUM (M2) IGE: <0.1 kU/L
COMMON RAGWEED (SHORT) (W1) IGE: <0.1 kU/L
Cat Dander: <0.1 kU/L
Class: 0
Class: 0
Class: 0
Class: 0
Class: 0
Class: 0
Class: 0
Class: 0
Class: 0
Class: 0
Class: 0
Class: 0
Class: 0
Class: 0
Class: 0
Class: 0
Class: 0
Class: 0
Class: 0
Class: 0
Class: 0
Class: 0
Class: 1
Class: 1
Cockroach: 0.12 kU/L — ABNORMAL HIGH
D. farinae: 0.35 kU/L — ABNORMAL HIGH
Dog Dander: 0.18 kU/L — ABNORMAL HIGH
Elm IgE: <0.1 kU/L
IgE (Immunoglobulin E), Serum: 66 kU/L (ref <=114)
Johnson Grass: <0.1 kU/L
Pecan/Hickory Tree IgE: <0.1 kU/L
Rough Pigweed  IgE: <0.1 kU/L
Sheep Sorrel IgE: <0.1 kU/L
Timothy Grass: <0.1 kU/L

## 2018-05-29 LAB — INTERPRETATION:

## 2018-05-29 NOTE — Assessment & Plan Note (Addendum)
Onset p probable CAP 1964 - partial resp to singulair then much better on asmanex but still noct cough and daytime throat clearing - Spirometry 04/29/2018  Completely nl - FENO 04/29/2018  =   11 off all rx  - 04/29/2018   try asmanex 100 2bid x 4 week sample  - 05/28/2018  After extensive coaching inhaler device,  effectiveness =    75% but made her cough > try asmanex 200  x 2 in am only and add noct 1st gen H1 blockers per guidelines  / bed blocks  - Allergy profile 05/28/2018 >  Eos 0.2 /  IgE  Pending   Note the history is changed somewhat from the last visit and it appears that she suffers from more of the upper airway cough then I had initially appreciated and that it is worse at bedtime after taking Asmanex at bedtime which tends to make her cough.  To sort through the differential diagnosis I therefore recommended  1)  Continue max rx for gerd and add back singulair which though may not be effective, at least doesn't make her cough and add to confusion re response to rx for asthma 2)  1st gen H1 blockers per guidelines     3) try sample of asmanex 200 just 2 in am but if no better p one week then try off again using a reverse therapeutic trial concept.  I reminded her and her daughter:  The standardized cough guidelines published in Chest by Lissa Morales in 2006 are still the best available and consist of a multiple step process (up to 12!) , not a single office visit,  and are intended  to address this problem logically,  with an alogrithm dependent on response to empiric treatment at  each progressive step  to determine a specific diagnosis with  minimal addtional testing needed. Therefore if adherence is an issue or can't be accurately verified,  it's very unlikely the standard evaluation and treatment will be successful here.    Furthermore, response to therapy (other than acute cough suppression, which should only be used short term with avoidance of narcotic containing cough syrups if  possible), can be a gradual process for which the patient is not likely to  perceive immediate benefit.  Unlike going to an eye doctor where the best perscription is almost always the first one and is immediately effective, this is almost never the case in the management of chronic cough syndromes. Therefore the patient needs to commit up front to consistently adhere to recommendations  for up to 6 weeks of therapy directed at the likely underlying problem(s) before the response can be reasonably evaluated.    The next step in the process therefore is full medication reconciliation.  To keep things simple, I have asked the patient to first separate medicines that are perceived as maintenance, that is to be taken daily "no matter what", from those medicines that are taken on only on an as-needed basis and I have given the patient examples of both, and then return to see  me in 4 weeks to consider going ahead with a methacholine challenge test and/or trial of gabapentin here.   I had an extended discussion with the patient reviewing all relevant studies completed to date and  lasting 15 to 20 minutes of a 25 minute visit    Each maintenance medication was reviewed in detail including most importantly the difference between maintenance and prns and under what circumstances the prns are to  be triggered using an action plan format that is not reflected in the computer generated alphabetically organized AVS.     Please see AVS for specific instructions unique to this visit that I personally wrote and verbalized to the the pt in detail and then reviewed with pt  by my nurse highlighting any  changes in therapy recommended at today's visit to their plan of care.

## 2018-05-30 NOTE — Progress Notes (Signed)
Spoke with pt and notified of results per Dr. Wert. Pt verbalized understanding and denied any questions. 

## 2018-06-19 MED FILL — MONTELUKAST SOD 10 MG TAB: 10 | 30 days supply | Qty: 30 | Fill #0

## 2018-06-19 MED FILL — traMADol HCL 50 MG TABS: 50 | 6 days supply | Qty: 18 | Fill #0

## 2018-06-25 ENCOUNTER — Ambulatory Visit (INDEPENDENT_AMBULATORY_CARE_PROVIDER_SITE_OTHER): Payer: Medicare Other | Admitting: Internal Medicine

## 2018-06-25 ENCOUNTER — Ambulatory Visit: Payer: Medicare Other | Admitting: Internal Medicine

## 2018-06-25 ENCOUNTER — Other Ambulatory Visit: Payer: Self-pay

## 2018-06-25 ENCOUNTER — Encounter: Payer: Self-pay | Admitting: Internal Medicine

## 2018-06-25 DIAGNOSIS — J45991 Cough variant asthma: Secondary | ICD-10-CM

## 2018-06-25 MED ORDER — MOMETASONE FUROATE 200 MCG/ACT IN AERO: 2.0000 | INHALATION_SPRAY | Freq: Every day | RESPIRATORY_TRACT | 6 refills | Status: DC

## 2018-06-25 NOTE — Progress Notes (Signed)
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    Onset p probable CAP 1964 - partial resp to singulair then much better on asmanex but still noct cough and daytime throat clearing - Spirometry 04/29/2018  Completely nl - FENO 04/29/2018  =   11 off all rx  - 04/29/2018   try asmanex 100 2bid x 4 week sample - 05/28/2018  After extensive coaching inhaler device,  effectiveness =    75% but made her cough > try asmanex 200  x 2 in am only and add noct 1st gen H1 blockers per guidelines  / bed blocks  - Allergy profile 05/28/2018 >  Eos 0.2 /  IgE  66  RAST pos dog dust cockroach     History of Present Illness  04/29/2018  Pulmonary/ 1st office eval/Chestina Komatsu  Chief Complaint  Patient presents with  . Consult    Chronic Cough  Dyspnea:  Not limited by breathing from desired activities   Cough: mostly dry  Day > noct  Sleep: on side bed flat / nose clogs up  SABA use: no better  Using lots of mints  rec Plan A = Automatic = asmanex 100 Take 2 puffs first thing in am and then another 2 puffs about 12 hours later.  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 Follow-up    States her cough has gotten better but still there. Would like refill of hycodan to have on stand by.   Dyspnea:  Not limited by breathing from desired activities   Cough: worse when heads hits pillow / worse with asmanex and with cornbread / no bettter  with tessalon or delsym - note this is not the hx she gave 04/29/2018 which was day > noct  Sleeping: flat bed  SABA use: xopenex ? Helping /rarely uses  02: none  Tends to ad lib on maint rx  rec For drainage / throat tickle try take CHLORPHENIRAMINE  4 mg (chlortabs 4 mg)  Pantoprazole (protonix) 40 mg   Take  30-60 min before first meal of the day and Pepcid (famotidine)  20 mg one @  bedtime until return to office  Stop night time asmanex  and restart singulair 10 mg each pm   If continue to cough after a week on the combination, then stop the asmanex 200  and continue singulair If get worse > xopenex is as needed       Virtual Visit via Telephone Note 06/25/2018   I connected with [name] on 06/25/18 at 11:00 AM EDT by telephone and verified that I am speaking with the correct person using two identifiers.   I discussed the limitations, risks, security and privacy concerns of performing an evaluation and management service by telephone and the availability of  in person appointments. I also discussed with the patient that there may be a patient responsible charge related to this service. The patient expressed understanding and agreed to proceed.   History of Present Illness: Since last ov cough is better  Esp  at hs p h1  X one and sleeping fine. Also taking singulair each pm and asmanex 200 x 2puff each am  Not limited by breathing from desired activities   / no need for saba  No obvious day to day or daytime variability or assoc excess/ purulent sputum or mucus plugs or hemoptysis or cp or chest tightness, subjective wheeze or overt sinus or hb symptoms.      Observations/Objective: slt hoarse phonation, occ throat clearing/ talking in full sentences    Assessment and Plan: See a/p   Follow Up Instructions: see avs for instructions unique to this ov  Outpatient Encounter Medications as of 06/25/2018  Medication Sig  . amLODipine (NORVASC) 5 MG tablet Take 1  tablet (5 mg total) by mouth daily.  Marland Kitchen atorvastatin (LIPITOR) 20 MG tablet TAKE 1 TABLET (20 MG TOTAL) BY MOUTH DAILY.  Marland Kitchen Cholecalciferol (VITAMIN D PO) Take 1,000 Units by mouth daily.   . famotidine (PEPCID) 40 MG tablet TAKE 1 TABLET BY MOUTH DAILY.  . fluticasone (FLONASE) 50 MCG/ACT nasal spray Place 2 sprays into both nostrils daily.  . hydrochlorothiazide (HYDRODIURIL) 25 MG tablet TAKE 1 TABLET (25 MG TOTAL) BY MOUTH DAILY.  Marland Kitchen ibuprofen (ADVIL,MOTRIN) 200 MG tablet Take 400-600 mg by mouth every 6 (six) hours as needed for headache or moderate pain.  Marland Kitchen levalbuterol (XOPENEX) 0.63 MG/3ML nebulizer solution Take 3 mLs (0.63 mg total) by nebulization every 8 (eight) hours as needed for wheezing or shortness of breath.  . Mometasone Furoate (ASMANEX HFA) 200 MCG/ACT AERO Inhale 2 puffs into the lungs daily.  . montelukast (SINGULAIR) 10 MG tablet Take 1 tablet (10 mg total) by mouth at bedtime.  . pantoprazole (PROTONIX) 40 MG tablet TAKE 1 TABLET BY MOUTH 2 TIMES DAILY.  . traMADol (ULTRAM) 50 MG tablet TAKE 1 TABLET BY MOUTH EVERY 8 HOURS  .  chlorpheniramine 4 mg  1-2 at bedtime and 1 q4h prn pnds daytime.        I discussed the assessment and treatment plan with the patient. The patient was provided an opportunity to ask questions and all were answered. The patient agreed with the plan and demonstrated an understanding of the instructions.   The patient was advised to call back or seek an in-person evaluation if the symptoms worsen or if the condition fails to improve as anticipated.  I provided 25  minutes of non-face-to-face time during this encounter.   Christinia Gully, MD

## 2018-06-25 NOTE — Patient Instructions (Signed)
I refilled your asmanex 200 to continue 2 puffs each am   No change in recommendations vs prior office eval    Please schedule a follow up visit in  About 4 months but call sooner if needed  with all medications /inhalers/ solutions in hand so we can verify exactly what you are taking. This includes all medications from all doctors and over the counters

## 2018-06-25 NOTE — Assessment & Plan Note (Signed)
Onset p probable CAP 1964 - partial resp to singulair then much better on asmanex but still noct cough and daytime throat clearing - Spirometry 04/29/2018  Completely nl - FENO 04/29/2018  =   11 off all rx  - 04/29/2018   try asmanex 100 2bid x 4 week sample - 05/28/2018  After extensive coaching inhaler device,  effectiveness =    75% but made her cough > try asmanex 200  x 2 in am only and add noct 1st gen H1 blockers per guidelines  / bed blocks  - Allergy profile 05/28/2018 >  Eos 0.2 /  IgE  66  RAST pos dog dust cockroach   Still not clear how much actual asthma she has but All goals of chronic asthma control met including optimal function and elimination of symptoms with minimal need for rescue therapy.  Contingencies discussed in full including contacting this office immediately if not controlling the symptoms using the rule of two's.  Reviewed allergies:  Does have  2 dogs/ advised to keep them out of the bedroom   Each maintenance medication was reviewed in detail including most importantly the difference between maintenance and as needed and under what circumstances the prns are to be used.  Please see AVS for specific  Instructions which are unique to this visit and I personally typed out  which will be mailed to pt.

## 2018-07-26 ENCOUNTER — Other Ambulatory Visit: Payer: Self-pay | Admitting: Family Medicine

## 2018-07-26 ENCOUNTER — Telehealth: Payer: Self-pay | Admitting: Internal Medicine

## 2018-07-26 DIAGNOSIS — R05 Cough: Secondary | ICD-10-CM

## 2018-07-26 DIAGNOSIS — R053 Chronic cough: Secondary | ICD-10-CM

## 2018-07-26 MED FILL — MONTELUKAST SOD 10 MG TAB: 10 | 30 days supply | Qty: 30 | Fill #1

## 2018-07-26 MED FILL — HYDROCHLOROTHIAZIDE 25 MG T: 25 | 90 days supply | Qty: 90 | Fill #0

## 2018-07-26 NOTE — Telephone Encounter (Signed)
Called and spoke with pt's daughter Angela Nevin in regards to the Asmanex HFA. Asked her if they had tried to call pharmacy as it looked like MW sent Rx to pharmacy for pt after televisit 3/31 and Angela Nevin stated she had called [facility] outpatient pharmacy since meds from [place] had been sent there due to the other pharmacies being closed at this time. Angela Nevin stated she was told by [facility] that the asmanex required a PA to be done. I stated to Angela Nevin if the med did need a PA, the request should automatically be sent to our office from the pharmacy but I do not see where we have even received a PA on pt's asmanex. I provided Angela Nevin with our office fax number so she can give to [facility] to make sure they are faxing the PA request to our correct fax number. Stated to [place] once we received the PA request, we would get it taken care of for pt and Angela Nevin verbalized understanding. Nothing further needed.

## 2018-07-29 ENCOUNTER — Telehealth: Payer: Self-pay | Admitting: Internal Medicine

## 2018-07-29 MED ORDER — FLUTICASONE PROPIONATE HFA 110 MCG/ACT IN AERO: 2.0000 | INHALATION_SPRAY | Freq: Every day | RESPIRATORY_TRACT | 2 refills | Status: DC

## 2018-07-29 MED FILL — FLOVENT HFA 110 MCG INHALER: 110 | 30 days supply | Qty: 12 | Fill #0

## 2018-07-29 MED FILL — PANTOPRAZOLE SOD DR 40 MG T: 40 | 30 days supply | Qty: 60 | Fill #0

## 2018-07-29 NOTE — Telephone Encounter (Signed)
In that case needs ov at her convenience with all meds in hand but should wait until on flovent x at least a week to see how that's working

## 2018-07-29 NOTE — Telephone Encounter (Signed)
Received fax from [facility] 200  not covered  She has to try flovent or arnuity first  I reviewed med hx and she has not tried these  No f/u pending here  Do you want to proceed with PA or try alternative? Please advise   Note to clinical staff if PA needed call [phone]

## 2018-07-29 NOTE — Telephone Encounter (Signed)
Pt aware of inhaler change since her insurance will not cover Asmanex she will change over to Flovent. She wants to make Dr. Melvyn Novas aware that the cough has come back.

## 2018-07-29 NOTE — Telephone Encounter (Signed)
flovent 110 x 2 puffs each am same drug and let her know she has to fail this before insurance will pay for asmanex.

## 2018-07-29 NOTE — Telephone Encounter (Signed)
Called and spoke with pt letting her know that MW wanted her to come in for an OV about a week after beginning Flovent so he can see if it has helped with her cough. Pt expressed understanding. appt has been scheduled for pt 5/18 at 9:45 for an in office visit with MW. covid screen was negative. Nothing further needed.

## 2018-08-12 ENCOUNTER — Encounter: Payer: Self-pay | Admitting: Internal Medicine

## 2018-08-12 ENCOUNTER — Ambulatory Visit: Payer: Medicare Other | Admitting: Internal Medicine

## 2018-08-12 ENCOUNTER — Other Ambulatory Visit: Payer: Self-pay

## 2018-08-12 VITALS — BP 118/80 | HR 80 | Ht 66.0 in | Wt 201.0 lb

## 2018-08-12 DIAGNOSIS — J45991 Cough variant asthma: Secondary | ICD-10-CM

## 2018-08-12 MED ORDER — MOMETASONE FUROATE 220 MCG/INH IN AEPB: 2.0000 | INHALATION_SPRAY | Freq: Every day | RESPIRATORY_TRACT | 0 refills | Status: DC

## 2018-08-12 MED FILL — AMLODIPINE BESYLATE 5 MG TA: 5 | 90 days supply | Qty: 90 | Fill #0

## 2018-08-12 MED FILL — MONTELUKAST SOD 10 MG TAB: 10 | 30 days supply | Qty: 30 | Fill #2

## 2018-08-12 MED FILL — ATORVASTATIN 20 MG TABLET: 20 | 90 days supply | Qty: 90 | Fill #0

## 2018-08-12 NOTE — Patient Instructions (Addendum)
Asmanex 100 Take 2 puffs first thing in am and then another 2 puffs about 12 hours later.   Stop flovent   Chlorpheniramine x 2 one hour before bed along with the pepcid 40 mg   Please schedule a follow up office visit in 6 weeks, call sooner if needed with all medications /inhalers/ solutions in hand so we can verify exactly what you are taking. This includes all medications from all doctors and over the [place] separate them into two bags:  the ones you take automatically, no matter what, vs the ones you take just when you feel you need them "BAG #2 is UP TO YOU"  - this will really help [place] help you take your medications more effectively.

## 2018-08-12 NOTE — Progress Notes (Signed)
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    History of Present Illness  04/29/2018  Pulmonary/ 1st office eval/Jennifer Wu  Chief Complaint  Patient presents with   Consult    Chronic Cough  Dyspnea:  Not limited by breathing from desired activities   Cough: mostly dry  Day > noct  Sleep: on side bed flat / nose clogs up  SABA use: no better  Using lots of mints  rec Plan A = Automatic = asmanex 100 Take 2 puffs first thing in am and then another 2 puffs about 12 hours later.  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 Would like refill of hycodan to have on stand by.   Dyspnea:  Not limited by breathing from desired activities   Cough: worse when heads hits pillow / worse with asmanex and with cornbread / no bettter with tessalon or delsym - note this is not the hx she gave 04/29/2018 which was day > noct  Sleeping: flat bed  SABA use: xopenex ? Helping /rarely uses  02: none  Tends to ad lib on maint rx  rec For drainage / throat tickle try take CHLORPHENIRAMINE  4 mg (chlortabs 4 mg) - take one every 4 hours as needed - available over the counter- may cause drowsiness so take 1 or 2 1 hours before bedtime   and see how you tolerate it before trying in daytime   Pantoprazole (protonix) 40 mg   Take  30-60 min before  first meal of the day and Pepcid (famotidine)  20 mg one @  bedtime until return to office - this is the best way to tell whether stomach acid is contributing to your problem.   Stop night time asmanex  and restart singulair 10 mg each pm   If continue to cough after a week on the combination, then stop the asmanex 200  and continue singulair If get worse > xopenex is as needed  Please remember to go to the lab department   for your tests - we will call you with the results when they are available. Please schedule a follow up office visit in 4 weeks, call sooner if needed with all medications /inhalers/ solutions in hand so we can verify exactly what you are taking. This includes all medications from all doctors and over the [place] separate them into two bags:  the ones you take automatically, no matter what, vs the ones you take just when you feel you need them "BAG #2 is UP TO YOU"  - this will really help [place] help you take your medications more effectively.     Telemed 06/25/2018 rec  refilled  asmanex 200 to continue 2 puffs each am > changed to flovent 110 by insurance No change in recommendations vs prior office eval     08/12/2018  f/u ov/Jennifer Wu re: cough x 40 years thinks better on asmanex than flovent per formulary restriction  Chief Complaint  Patient presents with   Follow-up    Breathing is "fine".   Dyspnea:  Not limited but very sedentary  Cough: more sporadic, better at bedtime after flovent 110 (as better on asmanex 200  Sleeping: one pillow, bed is flat - dog still in bedroom against advice / no bed blocks as advised /better p one h1 but still some noct "drainage and cough" > never increased to 2 at bedtime as previously recommended SABA use: none 02: none  Very disorgainized with meds -she did not separate her maintenance versus PRN's but brought all her medicines including medicine she does not take anymore.  She has a pillbox but it is only good for 1 dosing per  day and in no way matches up to her regimen.  She says she can tell what the medicines are by their colors and scatters them " through the week"  since there is only 1 slot per day.   No obvious day to day or daytime variability or assoc excess/ purulent sputum or mucus plugs or hemoptysis or cp or chest tightness, subjective wheeze or overt sinus or hb symptoms.   Sleeping as above  without nocturnal  or early am exacerbation  of respiratory  c/o's or need for noct saba. Also denies any obvious fluctuation of symptoms with weather or environmental changes or other aggravating or alleviating factors except as outlined above   No unusual exposure hx or h/o childhood pna/ asthma or knowledge of premature birth.  Current Allergies, Complete Past Medical History, Past Surgical History, Family History, and Social History were reviewed in [employer] record.  ROS  The following are not active complaints unless bolded Hoarseness, sore throat, dysphagia, dental problems, itching, sneezing,  nasal congestion or discharge of excess mucus or purulent secretions, ear ache,   fever, chills, sweats, unintended wt loss or wt gain, classically pleuritic or exertional cp,  orthopnea pnd or arm/hand swelling  or leg swelling, presyncope, palpitations, abdominal pain, anorexia, nausea, vomiting, diarrhea  or change in bowel habits or change in bladder habits, change in stools or change in urine, dysuria, hematuria,  rash, arthralgias, visual complaints, headache, numbness, weakness or ataxia or problems with walking or coordination,  change in mood or  memory.        Current Meds - - NOTE:   Unable to verify as accurately reflecting what pt takes     Medication Sig   amLODipine (NORVASC) 5 MG tablet Take 1 tablet (5 mg total) by mouth daily.   atorvastatin (LIPITOR) 20 MG tablet TAKE 1 TABLET (20 MG TOTAL) BY MOUTH DAILY.   chlorpheniramine (CHLOR-TRIMETON) 4 MG tablet Take 4 mg by mouth  every 4 (four) hours as needed for allergies.   Cholecalciferol (VITAMIN D PO) Take 1,000 Units by mouth daily.    famotidine (PEPCID) 40 MG tablet TAKE 1 TABLET BY MOUTH DAILY.   fluticasone (FLONASE) 50 MCG/ACT nasal spray Place 2 sprays into both nostrils daily.   fluticasone (FLOVENT HFA) 110 MCG/ACT inhaler Inhale 2 puffs into the lungs daily.   hydrochlorothiazide (HYDRODIURIL) 25 MG tablet TAKE 1 TABLET (25 MG TOTAL) BY MOUTH DAILY.   ibuprofen (ADVIL,MOTRIN) 200 MG tablet Take 400-600 mg  by mouth every 6 (six) hours as needed for headache or moderate pain.   montelukast (SINGULAIR) 10 MG tablet Take 1 tablet (10 mg total) by mouth at bedtime.   pantoprazole (PROTONIX) 40 MG tablet TAKE 1 TABLET BY MOUTH 2 TIMES DAILY.   traMADol (ULTRAM) 50 MG tablet TAKE 1 TABLET BY MOUTH EVERY 8 HOURS               Objective:    amb wf nad   08/12/2018       201   05/28/18 198 lb (89.8 kg)  04/29/18 203 lb (92.1 kg)  04/12/18 196 lb (88.9 kg)     Vital signs reviewed - Note on arrival 02 sats  99% on RA   HEENT: nl dentition, turbinates bilaterally, and oropharynx. Nl external ear canals without cough reflex   NECK :  without JVD/Nodes/TM/ nl carotid upstrokes bilaterally   LUNGS: no acc muscle use,  Nl contour chest which is clear to A and P bilaterally without cough on insp or exp maneuvers   CV:  RRR  no s3 or murmur or increase in P2, and no edema   ABD:  soft and nontender with nl inspiratory excursion in the supine position. No bruits or organomegaly appreciated, bowel sounds nl  MS:  Nl gait/ ext warm without deformities, calf tenderness, cyanosis or clubbing No obvious joint restrictions   SKIN: warm and dry without lesions    NEURO:  alert, approp, nl sensorium with  no motor or cerebellar deficits apparent.          Assessment

## 2018-08-13 ENCOUNTER — Encounter: Payer: Self-pay | Admitting: Internal Medicine

## 2018-08-13 NOTE — Assessment & Plan Note (Signed)
Onset probably p  CAP 1964 - partial resp to singulair then much better on asmanex but still noct cough and daytime throat clearing - Spirometry 04/29/2018  Completely nl - FENO 04/29/2018  =   11 off all rx  - 04/29/2018   try asmanex 100 2bid x 4 week sample - 05/28/2018   try asmanex 200  x 2 in am only and add noct 1st gen H1 blockers per guidelines  / bed blocks  - Allergy profile 05/28/2018 >  Eos 0.2 /  IgE  66  RAST pos dog dust cockroach  - 08/12/2018  After extensive coaching inhaler device,  effectiveness =    75% so since worse on flovent rec asmanex sample 2bid and increase noct 1st gen H1 blockers per guidelines  To 2 q hs    Again advised: The standardized cough guidelines published in Chest by Lissa Morales in 2006 are still the best available and consist of a multiple step process (up to 12!) , not a single office visit,  and are intended  to address this problem logically,  with an alogrithm dependent on response to empiric treatment at  each progressive step  to determine a specific diagnosis with  minimal addtional testing needed. Therefore if adherence is an issue or can't be accurately verified,  it's very unlikely the standard evaluation and treatment will be successful here.    Furthermore, response to therapy (other than acute cough suppression, which should only be used short term with avoidance of narcotic containing cough syrups if possible), can be a gradual process for which the patient is not likely to  perceive immediate benefit.  Unlike going to an eye doctor where the best perscription is almost always the first one and is immediately effective, this is almost never the case in the management of chronic cough syndromes. Therefore the patient needs to commit up front to consistently adhere to recommendations  for up to 6 weeks of therapy directed at the likely underlying problem(s) before the response can be reasonably evaluated.    Before we go any further we need to make  sure she does the things that we have asked her to do correctly first.  They are as follows:  1) rechallenge with Asmanex 100 x 2 puffs every 12 hours and continue the Singulair (samples given for now just to prove the point whether can justify PA for asmanex over flovent)  2) H1 x 2 hs and continue noct pepcid as well  3) return in 6 weeks with all meds in hand using a trust but verify approach to confirm accurate Medication  Reconciliation The principal here is that until we are certain that the  patients are doing what we've asked, it makes no sense to ask them to do more.    To keep things simple, I have asked the patient to first separate medicines that are perceived as maintenance, that is to be taken daily "no matter what", from those medicines that are taken on only on an as-needed basis and I have given the patient examples of both, and then return with them organized this way.  I have advised her if she is going to use a pill organizer she needs to have more than 1 slot per day unless she is only taking the medicines at 1 time a day, which is not the case.   I had an extended discussion with the patient reviewing all relevant studies completed to date and  lasting 41minutes  of a 40 minute office visit    See device teaching which extended face to face time for this visit.  Each maintenance medication was reviewed in detail including emphasizing most importantly the difference between maintenance and prns and under what circumstances the prns are to be triggered using an action plan format that is not reflected in the computer generated alphabetically organized AVS which I have not found useful in most complex patients, especially with respiratory illnesses  Please see AVS for specific instructions unique to this visit that I personally wrote and verbalized to the the pt in detail and then reviewed with pt  by my nurse highlighting any  changes in therapy recommended at today's visit to  their plan of care.

## 2018-08-16 ENCOUNTER — Other Ambulatory Visit: Payer: Self-pay | Admitting: Orthopedic Surgery

## 2018-08-16 DIAGNOSIS — S42295A Other nondisplaced fracture of upper end of left humerus, initial encounter for closed fracture: Secondary | ICD-10-CM

## 2018-08-20 ENCOUNTER — Other Ambulatory Visit: Payer: Self-pay

## 2018-08-20 ENCOUNTER — Ambulatory Visit
Admission: RE | Admit: 2018-08-20 | Discharge: 2018-08-20 | Disposition: A | Discharge status: Home / Self Care | Payer: Medicare Other | Source: Ambulatory Visit | Attending: Orthopedic Surgery | Admitting: Orthopedic Surgery

## 2018-08-20 DIAGNOSIS — S42295A Other nondisplaced fracture of upper end of left humerus, initial encounter for closed fracture: Secondary | ICD-10-CM

## 2018-08-21 MED FILL — HYDROCODON-APAP 5-325: 5-325 | 6 days supply | Qty: 25 | Fill #0

## 2018-08-26 MED FILL — SM ACID REDUCER 20 MG TAB: 20 | 25 days supply | Qty: 50 | Fill #0

## 2018-08-28 MED FILL — CYCLOBENZAPRINE 5 MG TABLET: 5 | 10 days supply | Qty: 30 | Fill #0

## 2018-09-02 ENCOUNTER — Encounter: Payer: Self-pay | Admitting: Family Medicine

## 2018-09-02 DIAGNOSIS — S42202A Unspecified fracture of upper end of left humerus, initial encounter for closed fracture: Secondary | ICD-10-CM | POA: Insufficient documentation

## 2018-09-10 ENCOUNTER — Other Ambulatory Visit: Payer: Self-pay

## 2018-09-10 ENCOUNTER — Encounter: Payer: Self-pay | Admitting: Internal Medicine

## 2018-09-10 ENCOUNTER — Ambulatory Visit: Payer: Medicare Other | Admitting: Internal Medicine

## 2018-09-10 DIAGNOSIS — J45991 Cough variant asthma: Secondary | ICD-10-CM | POA: Diagnosis not present

## 2018-09-10 MED ORDER — ASMANEX HFA 100 MCG/ACT IN AERO: 2.0000 | INHALATION_SPRAY | Freq: Two times a day (BID) | RESPIRATORY_TRACT | 0 refills | Status: DC

## 2018-09-10 NOTE — Progress Notes (Signed)
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    History of Present Illness  04/29/2018  Pulmonary/ 1st office eval/Kazuko Clemence  Chief Complaint  Patient presents with  . Consult    Chronic Cough  Dyspnea:  Not limited by breathing from desired activities   Cough: mostly dry  Day > noct  Sleep: on side bed flat / nose clogs up  SABA use: no better  Using lots of mints  rec Plan A = Automatic = asmanex 100 Take 2 puffs first thing in am and then another 2 puffs about 12 hours later.  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 Follow-up    States her cough has gotten better but still there. Would like refill of hycodan to have on stand by.   Dyspnea:  Not limited by breathing from desired activities   Cough: worse when heads hits pillow / worse with asmanex and with cornbread / no bettter with tessalon or delsym - note this is not the hx she gave 04/29/2018 which was day > noct  Sleeping: flat bed  SABA use: xopenex ? Helping /rarely uses  02: none  Tends to ad lib on maint rx  rec For drainage / throat tickle try take CHLORPHENIRAMINE  4 mg (chlortabs 4 mg) - take one every 4 hours as needed - available over the counter- may cause drowsiness so take 1 or 2 1 hours before bedtime   and see how you tolerate it before trying in daytime   Pantoprazole (protonix) 40 mg   Take  30-60 min before  first meal of the day and Pepcid (famotidine)  20 mg one @  bedtime until return to office - this is the best way to tell whether stomach acid is contributing to your problem.   Stop night time asmanex  and restart singulair 10 mg each pm   If continue to cough after a week on the combination, then stop the asmanex 200  and continue singulair If get worse > xopenex is as needed  Please remember to go to the lab department   for your tests - we will call you with the results when they are available. Please schedule a follow up office visit in 4 weeks, call sooner if needed with all medications /inhalers/ solutions in hand so we can verify exactly what you are taking. This includes all medications from all doctors and over the [place] separate them into two bags:  the ones you take automatically, no matter what, vs the ones you take just when you feel you need them "BAG #2 is UP TO YOU"  - this will really help [place] help you take your medications more effectively.     Telemed 06/25/2018 rec  refilled  asmanex 200 to continue 2 puffs each am > changed to flovent 110 by insurance No change in recommendations vs prior office eval     08/12/2018  f/u ov/Illyria Sobocinski re: cough x 40 years thinks better on asmanex than flovent per formulary restriction  Chief Complaint  Patient presents with  . Follow-up    Breathing is "fine".   Dyspnea:  Not limited but very sedentary  Cough: more sporadic, better at bedtime after flovent 110 (as better on asmanex 200  Sleeping: one pillow, bed is flat - dog not in bedroom/no bed blocks as advised /better p one h1 but still some noct "drainage and cough" > never increased to 2 at bedtime as previously recommended SABA use: none 02: none  Very disorgainized with meds -she did not separate her maintenance versus PRN's but brought all her medicines including medicine she does not take anymore.  She has a pillbox but it is only good for 1 dosing per day and in no way  matches up to her regimen.  She says she can tell what the medicines are by their colors and scatters them " through the week"  since there is only 1 slot per day. rec Asmanex 100 Take 2 puffs first thing in am and then another 2 puffs about 12 hours later.  Stop flovent  Chlorpheniramine x 2 one hour before bed along with the pepcid 40 mg    09/10/2018  f/u ov/Anyae Griffith re: a/c not working all summer and since hot the cough is worse though not using bed blocks or asmanex 200 consistently  Chief Complaint  Patient presents with  . Follow-up    C/O chronic cough that seems to be getting worse over the last week or 2.   Dyspnea:  Not limited by breathing from desired activities but very sedentary  Cough: sporadic / better hs vs day/ dry   Sleeping: better / no bed blocks  SABA use: none  02: none    No obvious day to day or daytime variability or assoc excess/ purulent sputum or mucus plugs or hemoptysis or cp or chest tightness, subjective wheeze or overt sinus or hb symptoms.   Sleeping as above without nocturnal  or early am exacerbation  of respiratory  c/o's or need for noct saba. Also denies any obvious fluctuation of symptoms with weather or environmental changes or other aggravating or alleviating factors except as outlined above   No unusual exposure hx or h/o childhood pna/ asthma or knowledge of premature birth.  Current Allergies, Complete Past Medical History, Past Surgical History, Family History, and Social History were reviewed in [facility] record.  ROS  The following are not active complaints unless bolded Hoarseness, sore throat, dysphagia, dental problems, itching, sneezing,  nasal congestion or discharge of excess mucus or purulent secretions, ear ache,   fever, chills, sweats, unintended wt loss or wt gain, classically pleuritic or exertional cp,  orthopnea pnd or arm/hand swelling  or leg swelling, presyncope, palpitations, abdominal pain, anorexia,  nausea, vomiting, diarrhea  or change in bowel habits or change in bladder habits, change in stools or change in urine, dysuria, hematuria,  rash, arthralgias, visual complaints, headache, numbness, weakness or ataxia or problems with walking or coordination,  change in mood or  memory.        Current Meds  Medication Sig  . amLODipine (NORVASC) 5 MG tablet Take 1 tablet (5 mg total) by mouth daily.  Marland Kitchen atorvastatin (LIPITOR) 20 MG tablet TAKE  1 TABLET (20 MG TOTAL) BY MOUTH DAILY.  . chlorpheniramine (CHLOR-TRIMETON) 4 MG tablet Take 4 mg by mouth every 4 (four) hours as needed for allergies.  . Cholecalciferol (VITAMIN D PO) Take 1,000 Units by mouth daily.   . cyclobenzaprine (FLEXERIL) 5 MG tablet   . famotidine (PEPCID) 40 MG tablet TAKE 1 TABLET BY MOUTH DAILY.  . fluticasone (FLONASE) 50 MCG/ACT nasal spray fluticasone propionate 50 mcg/actuation nasal spray,suspension  . hydrochlorothiazide (HYDRODIURIL) 25 MG tablet TAKE 1 TABLET (25 MG TOTAL) BY MOUTH DAILY.  Marland Kitchen ibuprofen (ADVIL,MOTRIN) 200 MG tablet Take 400-600 mg by mouth every 6 (six) hours as needed for headache or moderate pain.  Marland Kitchen levalbuterol (XOPENEX) 0.63 MG/3ML nebulizer solution levalbuterol 0.63 mg/3 mL solution for nebulization  USE 1 VIAL IN NEBULIER EVERY 8 HOURS AS NEEDED FOR WHEEZE/SHORTNESS OF BREATH  . mometasone (ASMANEX, 60 METERED DOSES,) 220 MCG/INH inhaler Inhale 2 puffs into the lungs daily.  . montelukast (SINGULAIR) 10 MG tablet Take 1 tablet (10 mg total) by mouth at bedtime.  . pantoprazole (PROTONIX) 40 MG tablet TAKE 1 TABLET BY MOUTH 2 TIMES DAILY.  . traMADol (ULTRAM) 50 MG tablet TAKE 1 TABLET BY MOUTH EVERY 8 HOURS                 Objective:    amb wf nad  09/10/2018       198  08/12/2018       201   05/28/18 198 lb (89.8 kg)  04/29/18 203 lb (92.1 kg)  04/12/18 196 lb (88.9 kg)     Vital signs reviewed - Note on arrival 02 sats  98% on RA     HEENT: nl dentition, turbinates  bilaterally, and oropharynx. Nl external ear canals without cough reflex   NECK :  without JVD/Nodes/TM/ nl carotid upstrokes bilaterally   LUNGS: no acc muscle use,  Nl contour chest which is clear to A and P bilaterally without cough on insp or exp maneuvers   CV:  RRR  no s3 or murmur or increase in P2, and no edema   ABD:  soft and nontender with nl inspiratory excursion in the supine position. No bruits or organomegaly appreciated, bowel sounds nl  MS:  Nl gait/ ext warm without deformities, calf tenderness, cyanosis or clubbing No obvious joint restrictions   SKIN: warm and dry without lesions    NEURO:  alert, approp, nl sensorium with  no motor or cerebellar deficits apparent.           Assessment

## 2018-09-10 NOTE — Patient Instructions (Addendum)
Ok to try asmanex 100 p to 4 puffs every 12 hours to see if helps the cough x 2 weeks then reduce to 2 every 12 hours  Try chlorpheniramine in daytime as needed for tickle/ drainage   I again recommend the use of 6-8 in bed blocks to elevate the head of your bed (use car jack if needed)  Change the calcium to powder for a month on a trial basis    Please schedule a follow up office visit in 6 weeks, call sooner if needed

## 2018-09-14 ENCOUNTER — Encounter: Payer: Self-pay | Admitting: Internal Medicine

## 2018-09-14 NOTE — Assessment & Plan Note (Addendum)
Onset probably p  CAP 1964 - partial resp to singulair then much better on asmanex but still noct cough and daytime throat clearing - Spirometry 04/29/2018  Completely nl - FENO 04/29/2018  =   11 off all rx  - 04/29/2018   try asmanex 100 2bid x 4 week sample - 05/28/2018   try asmanex 200  x 2 in am only and add noct 1st gen H1 blockers per guidelines  / bed blocks  - Allergy profile 05/28/2018 >  Eos 0.2 /  IgE  66  RAST pos dog dust cockroach  - 08/12/2018    since worse on flovent rec asmanex sample 2bid and increase noct 1st gen H1 blockers per guidelines  To 2 q hs > did not do   >>>>   09/10/2018  After extensive coaching inhaler device,  effectiveness =    75% with hfa so challenge with sample of asmanex 100 4 bid then taper to 2 bid with addition of 1st gen H1 blockers per guidelines  / continue singulair and ppi for the upper airway component.   Advised:  formulary restrictions will be an ongoing challenge for the forseable future and I would be happy to pick an alternative if the pt will first  provide me a list of them -  pt  will need to return here for training for any new device that is required eg dpi vs hfa vs respimat.    In the meantime we can always provide samples so that the patient never runs out of any needed respiratory medications as we did today to sort out whether responsive and then address the formulary problem.    I had an extended discussion with the patient reviewing all relevant studies completed to date and  lasting 15 to 20 minutes of a 25 minute visit    See device teaching which extended face to face time for this visit.  Each maintenance medication was reviewed in detail including emphasizing most importantly the difference between maintenance and prns and under what circumstances the prns are to be triggered using an action plan format that is not reflected in the computer generated alphabetically organized AVS which I have not found useful in most complex patients,  especially with respiratory illnesses  Please see AVS for specific instructions unique to this visit that I personally wrote and verbalized to the the pt in detail and then reviewed with pt  by my nurse highlighting any  changes in therapy recommended at today's visit to their plan of care.

## 2018-09-17 MED FILL — MONTELUKAST SOD 10 MG TAB: 10 | 30 days supply | Qty: 30 | Fill #0

## 2018-09-17 MED FILL — SM ACID REDUCER 20 MG TAB: 20 | 25 days supply | Qty: 50 | Fill #1

## 2018-09-20 ENCOUNTER — Ambulatory Visit: Payer: Medicare Other | Admitting: Internal Medicine

## 2018-10-14 MED FILL — PANTOPRAZOLE SOD DR 40 MG T: 40 | 30 days supply | Qty: 60 | Fill #0

## 2018-10-14 MED FILL — SM ACID REDUCER 20 MG TAB: 20 | 25 days supply | Qty: 50 | Fill #2

## 2018-10-14 MED FILL — MONTELUKAST SOD 10 MG TAB: 10 | 30 days supply | Qty: 30 | Fill #1

## 2018-10-22 ENCOUNTER — Other Ambulatory Visit: Payer: Self-pay

## 2018-10-22 ENCOUNTER — Ambulatory Visit: Payer: Medicare Other | Admitting: Internal Medicine

## 2018-10-22 ENCOUNTER — Encounter: Payer: Self-pay | Admitting: Internal Medicine

## 2018-10-22 DIAGNOSIS — J45991 Cough variant asthma: Secondary | ICD-10-CM

## 2018-10-22 NOTE — Patient Instructions (Signed)
No change in medications    Please schedule a follow up visit in 6  months but call sooner if needed  

## 2018-10-22 NOTE — Progress Notes (Signed)
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    History of Present Illness  04/29/2018  Pulmonary/ 1st office eval/Cherina Dhillon  Chief Complaint  Patient presents with  . Consult    Chronic Cough  Dyspnea:  Not limited by breathing from desired activities   Cough: mostly dry  Day > noct  Sleep: on side bed flat / nose clogs up  SABA use: no better  Using lots of mints  rec Plan A = Automatic = asmanex 100 Take 2 puffs first thing in am and then another 2 puffs about 12 hours later.  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 Follow-up    States her cough has gotten better but still there. Would like refill of hycodan to have on stand by.   Dyspnea:  Not limited by breathing from desired activities   Cough: worse when heads hits pillow / worse with asmanex and with cornbread / no bettter with tessalon or delsym - note this is not the hx she gave 04/29/2018 which was day > noct  Sleeping: flat bed  SABA use: xopenex ? Helping /rarely uses  02: none  Tends to ad lib on maint rx  rec For drainage / throat tickle try take CHLORPHENIRAMINE  4 mg (chlortabs 4 mg) - take one every 4 hours as needed - available over the counter- may cause drowsiness so take 1 or 2 1 hours before bedtime   and see how you tolerate it before trying in daytime   Pantoprazole (protonix) 40 mg   Take  30-60 min before  first meal of the day and Pepcid (famotidine)  20 mg one @  bedtime until return to office - this is the best way to tell whether stomach acid is contributing to your problem.   Stop night time asmanex  and restart singulair 10 mg each pm   If continue to cough after a week on the combination, then stop the asmanex 200  and continue singulair If get worse > xopenex is as needed  Please remember to go to the lab department   for your tests - we will call you with the results when they are available. Please schedule a follow up office visit in 4 weeks, call sooner if needed with all medications /inhalers/ solutions in hand so we can verify exactly what you are taking. This includes all medications from all doctors and over the [place] separate them into two bags:  the ones you take automatically, no matter what, vs the ones you take just when you feel you need them "BAG #2 is UP TO YOU"  - this will really help [place] help you take your medications more effectively.     Telemed 06/25/2018 rec  refilled  asmanex 200 to continue 2 puffs each am > changed to flovent 110 by insurance No change in recommendations vs prior office eval     08/12/2018  f/u ov/Arden Tinoco re: cough x 40 years thinks better on asmanex than flovent per formulary restriction  Chief Complaint  Patient presents with  . Follow-up    Breathing is "fine".   Dyspnea:  Not limited but very sedentary  Cough: more sporadic, better at bedtime after flovent 110 (as better on asmanex 200  Sleeping: one pillow, bed is flat - dog not in bedroom/no bed blocks as advised /better p one h1 but still some noct "drainage and cough" > never increased to 2 at bedtime as previously recommended SABA use: none 02: none  Very disorgainized with meds -she did not separate her maintenance versus PRN's but brought all her medicines including medicine she does not take anymore.  She has a pillbox but it is only good for 1 dosing per day and in no way  matches up to her regimen.  She says she can tell what the medicines are by their colors and scatters them " through the week"  since there is only 1 slot per day. rec Asmanex 100 Take 2 puffs first thing in am and then another 2 puffs about 12 hours later.  Stop flovent  Chlorpheniramine x 2 one hour before bed along with the pepcid 40 mg    09/10/2018  f/u ov/Euretha Najarro re: 40 year cough better, a/c not working all summer and since hot the cough is worse though not using bed blocks or asmanex 200 consistently  Chief Complaint  Patient presents with  . Follow-up    C/O chronic cough that seems to be getting worse over the last week or 2.   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 Follow-up    Breathing is doing well and she has not needed neb recently. She has occ cough.   Dyspnea:  Not limited by breathing from desired activities   Cough: better overall but still occ cough/ gag/vomiting (once a month)  Sleeping: able to lie flat, no bed blocks and doing better at hs  SABA use: none  02: none    No obvious day to day or daytime variability or assoc excess/ purulent sputum or mucus plugs or hemoptysis or cp or chest tightness, subjective wheeze or overt sinus or hb symptoms.   Sleeping flat  without nocturnal  or early am exacerbation  of respiratory  c/o's or need for noct saba. Also denies any obvious fluctuation of  symptoms with weather or environmental changes or other aggravating or alleviating factors except as outlined above   No unusual exposure hx or h/o childhood pna/ asthma or knowledge of premature birth.  Current Allergies, Complete Past Medical History, Past Surgical History,  Family History, and Social History were reviewed in [employer] record.  ROS  The following are not active complaints unless bolded Hoarseness, sore throat, dysphagia, dental problems, itching, sneezing,  nasal congestion or discharge of excess mucus or purulent secretions, ear ache,   fever, chills, sweats, unintended wt loss or wt gain, classically pleuritic or exertional cp,  orthopnea pnd or arm/hand swelling  or leg swelling, presyncope, palpitations, abdominal pain, anorexia, nausea, vomiting, diarrhea  or change in bowel habits or change in bladder habits, change in stools or change in urine, dysuria, hematuria,  rash, arthralgias, visual complaints, headache, numbness, weakness or ataxia or problems with walking or coordination,  change in mood or  memory.        Current Meds  Medication Sig  . amLODipine (NORVASC) 5 MG tablet Take 1 tablet (5 mg total) by mouth daily.  Marland Kitchen atorvastatin (LIPITOR) 20 MG tablet TAKE 1 TABLET (20 MG TOTAL) BY MOUTH DAILY.  . chlorpheniramine (CHLOR-TRIMETON) 4 MG tablet Take 4 mg by mouth every 4 (four) hours as needed for allergies.  . Cholecalciferol (VITAMIN D PO) Take 1,000 Units by mouth daily.   . famotidine (PEPCID) 40 MG tablet TAKE 1 TABLET BY MOUTH DAILY.  . fluticasone (FLONASE) 50 MCG/ACT nasal spray fluticasone propionate 50 mcg/actuation nasal spray,suspension  . hydrochlorothiazide (HYDRODIURIL) 25 MG tablet TAKE 1 TABLET (25 MG TOTAL) BY MOUTH DAILY.  Marland Kitchen ibuprofen (ADVIL,MOTRIN) 200 MG tablet Take 400-600 mg by mouth every 6 (six) hours as needed for headache or moderate pain.  Marland Kitchen levalbuterol (XOPENEX) 0.63 MG/3ML nebulizer solution levalbuterol 0.63  mg/3 mL solution for nebulization  USE 1 VIAL IN NEBULIER EVERY 8 HOURS AS NEEDED FOR WHEEZE/SHORTNESS OF BREATH  . Mometasone Furoate (ASMANEX HFA) 100 MCG/ACT AERO Inhale 2 puffs into the lungs 2 (two) times a day.  . montelukast (SINGULAIR) 10 MG tablet Take 1 tablet (10 mg total) by mouth at bedtime.  . pantoprazole (PROTONIX) 40 MG tablet TAKE 1 TABLET BY MOUTH 2 TIMES DAILY.               Objective:    amb wf nad quite verbose   Vital signs reviewed - Note on arrival 02 sats  96% on RA  10/22/2018       198    09/10/2018       198  08/12/2018       201   05/28/18 198 lb (89.8 kg)  04/29/18 203 lb (92.1 kg)  04/12/18 196 lb (88.9 kg)     HEENT: nl dentition, turbinates bilaterally, and oropharynx. Nl external ear canals without cough reflex   NECK :  without JVD/Nodes/TM/ nl carotid upstrokes bilaterally   LUNGS: no acc muscle use,  Nl contour chest which is clear to A and P bilaterally without cough on insp or exp maneuvers   CV:  RRR  no s3 or murmur or increase in P2, and no edema   ABD:  soft and nontender with nl inspiratory excursion in the supine position. No bruits or organomegaly appreciated, bowel sounds nl  MS:  Nl gait/ ext warm without deformities, calf tenderness, cyanosis or clubbing L arm in shoulder sling    SKIN: warm and dry without lesions    NEURO:  alert, approp, nl sensorium with  no motor or cerebellar deficits apparent.          Assessment

## 2018-10-23 MED FILL — CYCLOBENZAPRINE 5 MG TABLET: 5 | 10 days supply | Qty: 30 | Fill #0

## 2018-10-26 ENCOUNTER — Encounter: Payer: Self-pay | Admitting: Internal Medicine

## 2018-10-26 NOTE — Assessment & Plan Note (Signed)
Onset probably p  CAP ? 1964 - partial resp to singulair then much better on asmanex but still noct cough and daytime throat clearing - Spirometry 04/29/2018  Completely nl - FENO 04/29/2018  =   11 off all rx  - 04/29/2018   try asmanex 100 2bid x 4 week sample - 05/28/2018   try asmanex 200  x 2 in am only and add noct 1st gen H1 blockers per guidelines  / bed blocks  - Allergy profile 05/28/2018 >  Eos 0.2 /  IgE  66  RAST pos dog dust cockroach  - 08/12/2018    since worse on flovent rec asmanex sample 2bid and increase noct 1st gen H1 blockers per guidelines  To 2 q hs > did not do  - 09/10/2018  After extensive coaching inhaler device,  effectiveness =    75% with hfa so challenge with sample of asmanex 100 4 bid then taper to 2 bid with addition of 1st gen H1 blockers per guidelines     Adequate control on present rx, reviewed in detail with pt > no change in rx needed    Each maintenance medication was reviewed in detail including most importantly the difference between maintenance and as needed and under what circumstances the prns are to be used.  Please see AVS for specific  Instructions which are unique to this visit and I personally typed out  which were reviewed in detail in writing with the patient and a copy provided.

## 2018-10-30 LAB — HM DEXA SCAN

## 2018-11-11 ENCOUNTER — Encounter: Payer: Self-pay | Admitting: *Deleted

## 2018-11-26 MED FILL — CYCLOBENZAPRINE 5 MG TABLET: 5 | 10 days supply | Qty: 30 | Fill #0

## 2018-11-27 ENCOUNTER — Other Ambulatory Visit: Payer: Self-pay | Admitting: Family Medicine

## 2018-11-27 MED FILL — ATORVASTATIN 20 MG TABLET: 20 | 90 days supply | Qty: 90 | Fill #0

## 2018-11-27 MED FILL — AMLODIPINE BESYLATE 5 MG TA: 5 | 90 days supply | Qty: 90 | Fill #0

## 2018-11-27 MED FILL — SM ACID REDUCER 20 MG TAB: 20 | 25 days supply | Qty: 50 | Fill #3

## 2018-11-27 MED FILL — MONTELUKAST SOD 10 MG TAB: 10 | 30 days supply | Qty: 30 | Fill #2

## 2018-11-27 MED FILL — HYDROCHLOROTHIAZIDE 25 MG T: 25 | 90 days supply | Qty: 90 | Fill #0

## 2018-12-09 ENCOUNTER — Ambulatory Visit: Payer: Medicare Other | Admitting: Internal Medicine

## 2018-12-11 MED FILL — LEVOCETIRIZINE 5 MG TABLET: 5 | 90 days supply | Qty: 90 | Fill #1

## 2019-01-02 MED FILL — MONTELUKAST SOD 10 MG TAB: 10 | 30 days supply | Qty: 30 | Fill #3

## 2019-01-02 MED FILL — PANTOPRAZOLE SOD DR 40 MG T: 40 | 30 days supply | Qty: 60 | Fill #1

## 2019-01-02 MED FILL — CYCLOBENZAPRINE 5 MG TABLET: 5 | 10 days supply | Qty: 30 | Fill #1

## 2019-01-03 ENCOUNTER — Telehealth: Payer: Self-pay | Admitting: Internal Medicine

## 2019-01-03 MED ORDER — ASMANEX HFA 100 MCG/ACT IN AERO: 2.0000 | INHALATION_SPRAY | Freq: Two times a day (BID) | RESPIRATORY_TRACT | 0 refills | Status: DC

## 2019-01-03 NOTE — Addendum Note (Signed)
Addended by: Amado Coe on: 01/03/2019 04:37 PM   Modules accepted: Orders

## 2019-01-03 NOTE — Telephone Encounter (Signed)
Called spoke with patient about samples we have extra of in our office. Asked if she would like to pick up a sample of asmanex 100hfa exp 02/10/19. Patient states yes and will pick it up on Monday 01/06/19. Sample placed up front for patient pick up

## 2019-01-27 MED FILL — MONTELUKAST SOD 10 MG TAB: 10 | 30 days supply | Qty: 30 | Fill #4

## 2019-02-21 ENCOUNTER — Other Ambulatory Visit: Payer: Self-pay | Admitting: Family Medicine

## 2019-02-24 MED FILL — AMLODIPINE BESYLATE 5 MG TA: 5 | 90 days supply | Qty: 90 | Fill #0

## 2019-02-26 MED FILL — OXYBUTYNIN CL ER 5 MG TAB: 5 | 30 days supply | Qty: 30 | Fill #0

## 2019-03-17 LAB — HM MAMMOGRAPHY

## 2019-03-24 MED FILL — OXYBUTYNIN CL ER 10 MG TAB: 10 | 30 days supply | Qty: 30 | Fill #0

## 2019-03-25 MED FILL — MONTELUKAST SOD 10 MG TAB: 10 | 30 days supply | Qty: 30 | Fill #6

## 2019-03-27 ENCOUNTER — Encounter: Payer: Self-pay | Admitting: Family Medicine

## 2019-04-08 DIAGNOSIS — I8311 Varicose veins of right lower extremity with inflammation: Secondary | ICD-10-CM | POA: Diagnosis not present

## 2019-04-08 DIAGNOSIS — I8312 Varicose veins of left lower extremity with inflammation: Secondary | ICD-10-CM | POA: Diagnosis not present

## 2019-04-08 DIAGNOSIS — I83813 Varicose veins of bilateral lower extremities with pain: Secondary | ICD-10-CM | POA: Diagnosis not present

## 2019-04-25 ENCOUNTER — Ambulatory Visit: Payer: Medicare Other | Admitting: Internal Medicine

## 2019-04-28 MED FILL — OXYBUTYNIN CL ER 10 MG TAB: 10 | 30 days supply | Qty: 30 | Fill #1

## 2019-04-29 ENCOUNTER — Other Ambulatory Visit: Payer: Self-pay

## 2019-04-29 ENCOUNTER — Other Ambulatory Visit: Payer: Self-pay | Admitting: Family Medicine

## 2019-04-29 ENCOUNTER — Encounter: Payer: Self-pay | Admitting: Internal Medicine

## 2019-04-29 ENCOUNTER — Ambulatory Visit: Payer: Medicare Other | Admitting: Internal Medicine

## 2019-04-29 DIAGNOSIS — J45991 Cough variant asthma: Secondary | ICD-10-CM

## 2019-04-29 DIAGNOSIS — R053 Chronic cough: Secondary | ICD-10-CM

## 2019-04-29 DIAGNOSIS — R05 Cough: Secondary | ICD-10-CM

## 2019-04-29 MED ORDER — GABAPENTIN 100 MG PO CAPS: 100.0000 mg | ORAL_CAPSULE | Freq: Four times a day (QID) | ORAL | 2 refills | Status: DC

## 2019-04-29 MED ORDER — ASMANEX HFA 200 MCG/ACT IN AERO: 1.0000 | INHALATION_SPRAY | Freq: Two times a day (BID) | RESPIRATORY_TRACT | 0 refills | Status: DC

## 2019-04-29 MED ORDER — PREDNISONE 10 MG PO TABS: ORAL_TABLET | ORAL | 0 refills | Status: DC

## 2019-04-29 MED FILL — predniSONE 10 MG TABS: 10 | 6 days supply | Qty: 14 | Fill #0

## 2019-04-29 MED FILL — GABAPENTIN 100 MG CAPSULE: 100 | 30 days supply | Qty: 120 | Fill #0

## 2019-04-29 MED FILL — PANTOPRAZOLE SOD DR 40 MG T: 40 | 30 days supply | Qty: 60 | Fill #0

## 2019-04-29 MED FILL — MONTELUKAST SOD 10 MG TAB: 10 | 30 days supply | Qty: 30 | Fill #7

## 2019-04-29 NOTE — Progress Notes (Signed)
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    History of Present Illness  04/29/2018  Pulmonary/ 1st office eval/Corneluis Allston  Chief Complaint  Patient presents with  . Consult    Chronic Cough  Dyspnea:  Not limited by breathing from desired activities   Cough: mostly dry  Day > noct  Sleep: on side bed flat / nose clogs up  SABA use: no better  Using lots of mints  rec Plan A = Automatic = asmanex 100 Take 2 puffs first thing in am and then another 2 puffs about 12 hours later.  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 Follow-up    States her cough has gotten better but still there. Would like refill of hycodan to have on stand by.   Dyspnea:  Not limited by breathing from desired activities   Cough: worse when heads hits pillow / worse with asmanex and with cornbread / no bettter with tessalon or delsym - note this is not the hx she gave 04/29/2018 which was day > noct  Sleeping: flat bed  SABA use: xopenex ? Helping /rarely uses  02: none  Tends to ad lib on maint rx  rec For drainage / throat tickle try take CHLORPHENIRAMINE  4 mg (chlortabs 4 mg) - take one every 4 hours as needed - available over the counter- may cause drowsiness so take 1 or 2 1 hours before bedtime   and see how you tolerate it before trying in daytime   Pantoprazole (protonix) 40 mg   Take  30-60 min before  first meal of the day and Pepcid (famotidine)  20 mg one @  bedtime until return to office - this is the best way to tell whether stomach acid is contributing to your problem.   Stop night time asmanex  and restart singulair 10 mg each pm   If continue to cough after a week on the combination, then stop the asmanex 200  and continue singulair If get worse > xopenex is as needed     Telemed 06/25/2018 rec  refilled  asmanex 200 to continue 2 puffs each am > changed to flovent 110 by insurance No change in recommendations vs prior office eval     08/12/2018  f/u ov/Chanah Tidmore re: cough x 40 years thinks better on asmanex than flovent per formulary restriction  Chief Complaint  Patient presents with  . Follow-up    Breathing is "fine".   Dyspnea:  Not limited but very sedentary  Cough: more sporadic, better at bedtime after flovent 110 (vs better on asmanex 200  Sleeping: one pillow, bed is flat - dog not in bedroom/no bed blocks as advised /better p one h1 but still some noct "drainage and cough" > never  increased to 2 at bedtime as previously recommended SABA use: none 02: none  Very disorgainized with meds -she did not separate her maintenance versus PRN's but brought all her medicines including medicine she does not take anymore.  She has a pillbox but it is only good for 1 dosing per day and in no way matches up to her regimen.  She says she can tell what the medicines are by their colors and scatters them " through the week"  since there is only 1 slot per day. rec Asmanex 100 Take 2 puffs first thing in am and then another 2 puffs about 12 hours later.  Stop flovent  Chlorpheniramine x 2 one hour before bed along with the pepcid 40 mg    09/10/2018  f/u ov/Srihari Shellhammer re: 40 year cough better, a/c not working all summer and since hot the cough is worse though not using bed blocks or asmanex 200 consistently  Chief Complaint  Patient presents with  . Follow-up    C/O chronic cough that seems to  be getting worse over the last week or 2.   Dyspnea:  Not limited by breathing from desired activities but very sedentary  Cough: sporadic / better hs vs day/ dry   Sleeping: better / no bed blocks  rec Ok to try asmanex 100 p to 4 puffs every 12 hours to see if helps the cough x 2 weeks then reduce to 2 every 12 hours Try chlorpheniramine in daytime as needed for tickle/ drainage  I again recommend the use of 6-8 in bed blocks to elevate the head of your bed (use car jack if needed) Try off D3 gel (oil based)     04/29/2019  f/u ov/Coby Antrobus re:  Cough since 1964  Chief Complaint  Patient presents with  . Follow-up    Breathing is doing well. Her cough is unchanged.   Dyspnea:  Not limited by breathing from desired activities including walking in woods  Cough: dry/gagging  to point of face turning red up to 10 x daily no pattern and says 24/7 now  Sleeping: no bed blocks/ has wedge still coughing despite h1 hs  SABA use: none  02: none    No obvious day to day or daytime variability or assoc excess/ purulent sputum or mucus plugs or hemoptysis or cp or chest tightness, subjective wheeze or overt sinus or hb symptoms.    Also denies any obvious fluctuation of symptoms with weather or environmental changes or other aggravating or alleviating factors except as outlined above   No unusual exposure hx or h/o childhood pna/ asthma or knowledge of premature birth.  Current Allergies, Complete Past Medical History, Past Surgical History, Family History, and Social History were reviewed in [facility] record.  ROS  The following are not active complaints unless bolded Hoarseness, sore throat, dysphagia, dental problems, itching, sneezing,  nasal congestion or discharge of excess mucus or purulent secretions, ear ache,   fever, chills, sweats, unintended wt loss or wt gain, classically pleuritic or exertional cp,  orthopnea pnd or arm/hand swelling  or leg swelling,  presyncope, palpitations, abdominal pain, anorexia, nausea, vomiting, diarrhea  or change in bowel habits or change in bladder habits, change in stools or change in urine, dysuria, hematuria,  rash, arthralgias, visual complaints, headache, numbness, weakness or ataxia or problems with walking or coordination,  change in mood or  memory.        Current Meds  Medication Sig  . amLODipine (  NORVASC) 5 MG tablet TAKE 1 TABLET (5 MG TOTAL) BY MOUTH DAILY.  Marland Kitchen atorvastatin (LIPITOR) 20 MG tablet TAKE 1 TABLET (20 MG TOTAL) BY MOUTH DAILY.  . chlorpheniramine (CHLOR-TRIMETON) 4 MG tablet Take 4 mg by mouth every 4 (four) hours as needed for allergies.  . cyclobenzaprine (FLEXERIL) 5 MG tablet   . ergocalciferol (VITAMIN D2) 1.25 MG (50000 UT) capsule Take 1 capsule by mouth every 7 (seven) days.  . famotidine (PEPCID) 40 MG tablet TAKE 1 TABLET BY MOUTH DAILY.  . fluticasone (FLONASE) 50 MCG/ACT nasal spray fluticasone propionate 50 mcg/actuation nasal spray,suspension  . hydrochlorothiazide (HYDRODIURIL) 25 MG tablet TAKE 1 TABLET (25 MG TOTAL) BY MOUTH DAILY.  Marland Kitchen ibuprofen (ADVIL,MOTRIN) 200 MG tablet Take 400-600 mg by mouth every 6 (six) hours as needed for headache or moderate pain.  Marland Kitchen levalbuterol (XOPENEX) 0.63 MG/3ML nebulizer solution levalbuterol 0.63 mg/3 mL solution for nebulization  USE 1 VIAL IN NEBULIER EVERY 8 HOURS AS NEEDED FOR WHEEZE/SHORTNESS OF BREATH  . Mometasone Furoate (ASMANEX HFA) 100 MCG/ACT AERO Inhale 2 puffs into the lungs 2 (two) times a day.  . montelukast (SINGULAIR) 10 MG tablet Take 1 tablet (10 mg total) by mouth at bedtime.  Marland Kitchen oxybutynin (DITROPAN-XL) 10 MG 24 hr tablet Take 1 tablet by mouth daily.  . pantoprazole (PROTONIX) 40 MG tablet TAKE 1 TABLET BY MOUTH 2 TIMES DAILY.                 Objective:    amb wf nad with freq throat clearing, quite vigorous    04/29/2019         202  10/22/2018       198    09/10/2018       198  08/12/2018       201    05/28/18 198 lb (89.8 kg)  04/29/18 203 lb (92.1 kg)  04/12/18 196 lb (88.9 kg)    Vital signs reviewed  04/29/2019  - Note at rest 02 sats  97% on RA     HEENT : pt wearing mask not removed for exam due to covid -19 concerns.    NECK :  without JVD/Nodes/TM/ nl carotid upstrokes bilaterally   LUNGS: no acc muscle use,  Nl contour chest which is clear to A and P bilaterally without cough on insp or exp maneuvers   CV:  RRR  no s3 or murmur or increase in P2, and no edema   ABD:  soft and nontender with nl inspiratory excursion in the supine position. No bruits or organomegaly appreciated, bowel sounds nl  MS:  Nl gait/ ext warm without deformities, calf tenderness, cyanosis or clubbing No obvious joint restrictions   SKIN: warm and dry without lesions    NEURO:  alert, approp, nl sensorium with  no motor or cerebellar deficits apparent.         Assessment

## 2019-04-29 NOTE — Patient Instructions (Addendum)
Pantoprazole 40mg  Take 30- 60 min before your first and last meals of the day and pepcid 40 mg one hour before bedtime   Prednisone 10 mg take  4 each am x 2 days,   2 each am x 2 days,  1 each am x 2 days and stop   Decrease asmanex 200 Take 1 puffs first thing in am and then another 1 puffs about 12 hours later.   Gabapentin 100 mg four times daily   For drainage / throat tickle try take CHLORPHENIRAMINE  4 mg  (Chlortab 4mg   at [employer] should be easiest to find in the green box)  take one every 4 hours as needed - available over the counter- may cause drowsiness so start with just a bedtime dose or two and see how you tolerate it before trying in daytime     Please schedule a follow up office visit in 2 weeks, sooner if needed  with all medications /inhalers/ solutions in hand so we can verify exactly what you are taking. This includes all medications from all doctors and over the counters

## 2019-04-30 ENCOUNTER — Encounter: Payer: Self-pay | Admitting: Internal Medicine

## 2019-04-30 NOTE — Assessment & Plan Note (Addendum)
Onset probably p  CAP ? 1964 - partial resp to singulair then much better on asmanex but still noct cough and daytime throat clearing - Spirometry 04/29/2018  Completely nl - FENO 04/29/2018  =   11 off all rx  - 04/29/2018   try asmanex 100 2bid x 4 week sample - 05/28/2018   try asmanex 200  x 2 in am only and add noct 1st gen H1 blockers per guidelines  / bed blocks  - Allergy profile 05/28/2018 >  Eos 0.2 /  IgE  66  RAST pos dog dust cockroach  - 08/12/2018    since worse on flovent rec asmanex sample 2bid and increase noct 1st gen H1 blockers per guidelines  To 2 q hs > did not do  - 09/10/2018  After extensive coaching inhaler device,  effectiveness =    75% with hfa so challenge with sample of asmanex 100 4 bid then taper to 2 bid with addition of 1st gen H1 blockers per guidelines   - 04/29/2019  After extensive coaching inhaler device,  effectiveness =    75% (Ti too short) and makes her cough worse so reduce dose to asthmanex 200 one bid and added trial of gabapentin 100 tid   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  Upper airway cough syndrome (previously labeled PNDS),  is so named because it's frequently impossible to sort out how much is  CR/sinusitis with freq throat clearing (which can be related to primary GERD)   vs  causing  secondary (" extra esophageal")  GERD from wide swings in gastric pressure that occur with throat clearing, often  promoting self use of mint and menthol lozenges that reduce the lower esophageal sphincter tone and exacerbate the problem further in a cyclical fashion.   These are the same pts (now being labeled as having "irritable larynx syndrome" by some cough centers) who not infrequently have a history of having failed to tolerate ace inhibitors,  dry powder inhalers or biphosphonates or report having atypical/extraesophageal reflux symptoms that don't respond to standard doses of PPI  and are easily confused as having aecopd or asthma flares by even experienced  allergists/ pulmonologists (myself included).    If not improving on trial of gabapentin 100 qid for irritable larynx either needs referral to Dr. Joya Gaskins at [facility] voice center or methacholine challenge or both.    >> return in 2 weeks for med reconciliation:  To keep things simple, I have asked the patient to first separate medicines that are perceived as maintenance, that is to be taken daily "no matter what", from those medicines that are taken on only on an as-needed basis and I have given the patient examples of both, and then return to see me with all meds in hand using a trust but verify approach to confirm accurate Medication  Reconciliation The principal here is that until we are certain that the  patients are doing what we've asked, it makes no sense to ask them to do more.    Each maintenance medication was reviewed in detail including emphasizing most importantly the difference between maintenance and prns and under what circumstances the prns are to be triggered using an action plan format where appropriate.  Total time for H and P, chart review, counseling, teaching device and generating customized AVS unique to this office visit / charting = 30 m

## 2019-05-01 MED FILL — ALPRAZolam 0.5 MG TABS: 0.5 | 1 days supply | Qty: 4 | Fill #0

## 2019-05-07 DIAGNOSIS — I83811 Varicose veins of right lower extremities with pain: Secondary | ICD-10-CM | POA: Diagnosis not present

## 2019-05-07 DIAGNOSIS — I8311 Varicose veins of right lower extremity with inflammation: Secondary | ICD-10-CM | POA: Diagnosis not present

## 2019-05-09 DIAGNOSIS — I8311 Varicose veins of right lower extremity with inflammation: Secondary | ICD-10-CM | POA: Diagnosis not present

## 2019-05-13 ENCOUNTER — Encounter: Payer: Self-pay | Admitting: Internal Medicine

## 2019-05-13 ENCOUNTER — Ambulatory Visit: Payer: Medicare PPO | Admitting: Internal Medicine

## 2019-05-13 ENCOUNTER — Other Ambulatory Visit: Payer: Self-pay

## 2019-05-13 DIAGNOSIS — J45991 Cough variant asthma: Secondary | ICD-10-CM

## 2019-05-13 NOTE — Patient Instructions (Addendum)
No change in medications  For drainage / throat tickle try take CHLORPHENIRAMINE  4 mg  (Chlortab 4mg   at [employer] should be easiest to find in the green box)  take one- two  every 4 hours as needed    Please schedule a follow up visit in 3 months but call sooner if needed

## 2019-05-13 NOTE — Progress Notes (Signed)
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    History of Present Illness  04/29/2018  Pulmonary/ 1st office eval/Jennifer Wu  Chief Complaint  Patient presents with  . Consult    Chronic Cough  Dyspnea:  Not limited by breathing from desired activities   Cough: mostly dry  Day > noct  Sleep: on side bed flat / nose clogs up  SABA use: no better  Using lots of mints  rec Plan A = Automatic = asmanex 100 Take 2 puffs first thing in am and then another 2 puffs about 12 hours later.  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 Follow-up    States her cough has gotten better but still there. Would like refill of hycodan to have on stand by.   Dyspnea:  Not limited by breathing from desired activities   Cough: worse when heads hits pillow / worse with asmanex and with cornbread / no bettter with tessalon or delsym - note this is not the hx she gave 04/29/2018 which was day > noct  Sleeping: flat bed  SABA use: xopenex ? Helping /rarely uses  02: none  Tends to ad lib on maint rx  rec For drainage / throat tickle try take CHLORPHENIRAMINE  4 mg (chlortabs 4 mg) - take one every 4 hours as needed - available over the counter- may cause drowsiness so take 1 or 2 1 hours before bedtime   and see how you tolerate it before trying in daytime   Pantoprazole (protonix) 40 mg   Take  30-60 min before  first meal of the day and Pepcid (famotidine)  20 mg one @  bedtime until return to office - this is the best way to tell whether stomach acid is contributing to your problem.   Stop night time asmanex  and restart singulair 10 mg each pm   If continue to cough after a week on the combination, then stop the asmanex 200  and continue singulair If get worse > xopenex is as needed     Telemed 06/25/2018 rec  refilled  asmanex 200 to continue 2 puffs each am > changed to flovent 110 by insurance No change in recommendations vs prior office eval     08/12/2018  f/u ov/Jennifer Wu re: cough x 40 years thinks better on asmanex than flovent per formulary restriction  Chief Complaint  Patient presents with  . Follow-up    Breathing is "fine".   Dyspnea:  Not limited but very sedentary  Cough: more sporadic, better at bedtime after flovent 110 (vs better on asmanex 200  Sleeping: one pillow, bed is flat - dog not in bedroom/no bed blocks as advised /better p one h1 but still some noct "drainage and cough" > never  increased to 2 at bedtime as previously recommended SABA use: none 02: none  Very disorgainized with meds -she did not separate her maintenance versus PRN's but brought all her medicines including medicine she does not take anymore.  She has a pillbox but it is only good for 1 dosing per day and in no way matches up to her regimen.  She says she can tell what the medicines are by their colors and scatters them " through the week"  since there is only 1 slot per day. rec Asmanex 100 Take 2 puffs first thing in am and then another 2 puffs about 12 hours later.  Stop flovent  Chlorpheniramine x 2 one hour before bed along with the pepcid 40 mg    09/10/2018  f/u ov/Jennifer Wu re: 40 year cough better, a/c not working all summer and since hot the cough is worse though not using bed blocks or asmanex 200 consistently  Chief Complaint  Patient presents with  . Follow-up    C/O chronic cough that seems to  be getting worse over the last week or 2.   Dyspnea:  Not limited by breathing from desired activities but very sedentary  Cough: sporadic / better hs vs day/ dry   Sleeping: better / no bed blocks  rec Ok to try asmanex 100 p to 4 puffs every 12 hours to see if helps the cough x 2 weeks then reduce to 2 every 12 hours Try chlorpheniramine in daytime as needed for tickle/ drainage  I again recommend the use of 6-8 in bed blocks to elevate the head of your bed (use car jack if needed) Try off D3 gel (oil based)     04/29/2019  f/u ov/Jennifer Wu re:  Cough since 1964  Chief Complaint  Patient presents with  . Follow-up    Breathing is doing well. Her cough is unchanged.   Dyspnea:  Not limited by breathing from desired activities including walking in woods  Cough: dry/gagging  to point of face turning red up to 10 x daily no pattern and says 24/7 now  Sleeping: no bed blocks/ has wedge still coughing despite h1 hs  SABA use: none  02: none  rec Pantoprazole 40mg  Take 30- 60 min before your first and last meals of the day and pepcid 40 mg one hour before bedtime  Prednisone 10 mg take  4 each am x 2 days,   2 each am x 2 days,  1 each am x 2 days and stop  Decrease asmanex 200 Take 1 puffs first thing in am and then another 1 puffs about 12 hours later.  Gabapentin 100 mg four times daily  For drainage / throat tickle try take CHLORPHENIRAMINE  4 mg  Please schedule a follow up office visit in 2 weeks, sooner if needed  with all medications /inhalers/ solutions in hand so we can verify exactly what you are taking. This includes all medications from all doctors and over the counters    05/13/2019  f/u ov/Jennifer Wu re: cough since 1964 finally improving on gabapentin Chief Complaint  Patient presents with  . Follow-up    Cough is doing better since the last visit.   Dyspnea:   Not limited by breathing from desired activities  / still doing some walking when the weather is good  Cough: much better  unless around strong smells Sleeping:  Better p chlorpheniramine x 2 at hs  SABA use: none  02: none    No obvious  day to day or daytime variability or assoc excess/ purulent sputum or mucus plugs or hemoptysis or cp or chest tightness, subjective wheeze or overt sinus or hb symptoms.   Sleeping  without nocturnal  or early am exacerbation  of respiratory  c/o's or need for noct saba. Also denies any obvious fluctuation of symptoms with weather or environmental changes or other aggravating or alleviating factors except as outlined above   No unusual exposure hx or h/o childhood pna/ asthma or knowledge of premature birth.  Current Allergies, Complete Past Medical History, Past Surgical History, Family History, and Social History were reviewed in [employer] record.  ROS  The following are not active complaints unless bolded Hoarseness, sore throat, dysphagia, dental problems, itching, sneezing,  nasal congestion or discharge of excess mucus or purulent secretions, ear ache,   fever, chills, sweats, unintended wt loss or wt gain, classically pleuritic or exertional cp,  orthopnea pnd or arm/hand swelling  or leg swelling, presyncope, palpitations, abdominal pain, anorexia, nausea, vomiting, diarrhea  or change in bowel habits or change in bladder habits, change in stools or change in urine, dysuria, hematuria,  rash, arthralgias, visual complaints, headache, numbness, weakness or ataxia or problems with walking or coordination,  change in mood or  memory.        Current Meds  Medication Sig  . ALPRAZolam (XANAX) 0.5 MG tablet As directed prior to procedure  . amLODipine (NORVASC) 5 MG tablet TAKE 1 TABLET (5 MG TOTAL) BY MOUTH DAILY.  Marland Kitchen atorvastatin (LIPITOR) 20 MG tablet TAKE 1 TABLET (20 MG TOTAL) BY MOUTH DAILY.  . chlorpheniramine (CHLOR-TRIMETON) 4 MG tablet Take 4 mg by mouth every 4 (four) hours as needed for allergies.  . cyclobenzaprine (FLEXERIL) 5 MG tablet    . ergocalciferol (VITAMIN D2) 1.25 MG (50000 UT) capsule Take 1 capsule by mouth every 7 (seven) days.  . famotidine (PEPCID) 40 MG tablet TAKE 1 TABLET BY MOUTH DAILY.  . fluticasone (FLONASE) 50 MCG/ACT nasal spray fluticasone propionate 50 mcg/actuation nasal spray,suspension  . gabapentin (NEURONTIN) 100 MG capsule Take 1 capsule (100 mg total) by mouth 4 (four) times daily. \  . hydrochlorothiazide (HYDRODIURIL) 25 MG tablet TAKE 1 TABLET (25 MG TOTAL) BY MOUTH DAILY.  Marland Kitchen ibuprofen (ADVIL,MOTRIN) 200 MG tablet Take 400-600 mg by mouth every 6 (six) hours as needed for headache or moderate pain.  Marland Kitchen levalbuterol (XOPENEX) 0.63 MG/3ML nebulizer solution levalbuterol 0.63 mg/3 mL solution for nebulization  USE 1 VIAL IN NEBULIER EVERY 8 HOURS AS NEEDED FOR WHEEZE/SHORTNESS OF BREATH  . Mometasone Furoate (ASMANEX HFA) 200 MCG/ACT AERO Inhale 1 puff into the lungs 2 (two) times daily.  . montelukast (SINGULAIR) 10 MG tablet Take 1 tablet (10 mg total) by mouth at bedtime.  . Nutritional Supplements (VARICOSE VEINS FORMULA PO) Take by mouth daily.  Marland Kitchen oxybutynin (DITROPAN-XL) 10 MG 24 hr tablet Take 1 tablet by mouth daily.  . pantoprazole (PROTONIX) 40 MG tablet TAKE 1 TABLET BY MOUTH TWO TIMES DAILY                    Objective:     pleasant amb wf nad    05/13/2019      209  04/29/2019         202  10/22/2018       198    09/10/2018       198  08/12/2018       201   05/28/18 198  lb (89.8 kg)  04/29/18 203 lb (92.1 kg)  04/12/18 196 lb (88.9 kg)    Vital signs reviewed  05/13/2019  - Note at rest 02 sats  95% on RA    HEENT : pt wearing mask not removed for exam due to covid -19 concerns.    NECK :  without JVD/Nodes/TM/ nl carotid upstrokes bilaterally   LUNGS: no acc muscle use,  Nl contour chest which is clear to A and P bilaterally without cough on insp or exp maneuvers   CV:  RRR  no s3 or murmur or increase in P2, and no edema   ABD:  Obese soft and nontender with nl  inspiratory excursion in the supine position. No bruits or organomegaly appreciated, bowel sounds nl  MS:  Nl gait/ ext warm without deformities, calf tenderness, cyanosis or clubbing No obvious joint restrictions   SKIN: warm and dry without lesions    NEURO:  alert, approp, nl sensorium with  no motor or cerebellar deficits apparent.               Assessment

## 2019-05-14 ENCOUNTER — Encounter: Payer: Self-pay | Admitting: Internal Medicine

## 2019-05-14 DIAGNOSIS — I8311 Varicose veins of right lower extremity with inflammation: Secondary | ICD-10-CM | POA: Diagnosis not present

## 2019-05-14 DIAGNOSIS — I83891 Varicose veins of right lower extremities with other complications: Secondary | ICD-10-CM | POA: Diagnosis not present

## 2019-05-14 DIAGNOSIS — I83811 Varicose veins of right lower extremities with pain: Secondary | ICD-10-CM | POA: Diagnosis not present

## 2019-05-14 NOTE — Assessment & Plan Note (Signed)
Onset probably p  CAP ? 1964 - partial resp to singulair then much better on asmanex but still noct cough and daytime throat clearing - Spirometry 04/29/2018  Completely nl - FENO 04/29/2018  =   11 off all rx  - 04/29/2018   try asmanex 100 2bid x 4 week sample - 05/28/2018   try asmanex 200  x 2 in am only and add noct 1st gen H1 blockers per guidelines  / bed blocks  - Allergy profile 05/28/2018 >  Eos 0.2 /  IgE  66  RAST pos dog dust cockroach  - 08/12/2018    since worse on flovent rec asmanex sample 2bid and increase noct 1st gen H1 blockers per guidelines  To 2 q hs > did not do  - 09/10/2018  After extensive coaching inhaler device,  effectiveness =    75% with hfa so challenge with sample of asmanex 100 4 bid then taper to 2 bid with addition of 1st gen H1 blockers per guidelines   - 04/29/2019  After extensive coaching inhaler device,  effectiveness =    75% (Ti too short) and makes her cough worse so reduce dose to asthmanex 200 one bid and added trial of gabapentin 100 qid > improved 05/13/2019 so rec same rx x 3 m then return   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          Each maintenance medication was reviewed in detail including emphasizing most importantly the difference between maintenance and prns and under what circumstances the prns are to be triggered using an action plan format where appropriate.  Total time for H and P, chart review, counseling,   and generating customized AVS unique to this office visit / charting = 20 min

## 2019-05-26 ENCOUNTER — Other Ambulatory Visit: Payer: Self-pay | Admitting: Internal Medicine

## 2019-05-26 ENCOUNTER — Other Ambulatory Visit: Payer: Self-pay | Admitting: Family Medicine

## 2019-05-26 DIAGNOSIS — J45991 Cough variant asthma: Secondary | ICD-10-CM

## 2019-05-26 MED FILL — PANTOPRAZOLE SOD DR 40 MG T: 40 | 30 days supply | Qty: 60 | Fill #1

## 2019-05-26 MED FILL — GABAPENTIN 100 MG CAPSULE: 100 | 30 days supply | Qty: 120 | Fill #1

## 2019-05-26 MED FILL — AMLODIPINE BESYLATE 5 MG TA: 5 | 90 days supply | Qty: 90 | Fill #1

## 2019-05-26 MED FILL — MONTELUKAST SOD 10 MG TAB: 10 | 30 days supply | Qty: 30 | Fill #0

## 2019-05-26 MED FILL — HYDROCHLOROTHIAZIDE 25 MG T: 25 | 90 days supply | Qty: 90 | Fill #0

## 2019-06-04 ENCOUNTER — Other Ambulatory Visit: Payer: Self-pay | Admitting: Family Medicine

## 2019-06-05 MED FILL — FAMOTIDINE 40 MG TABLET: 40 | 30 days supply | Qty: 30 | Fill #0

## 2019-06-16 ENCOUNTER — Ambulatory Visit (INDEPENDENT_AMBULATORY_CARE_PROVIDER_SITE_OTHER): Payer: Medicare Other | Admitting: Family Medicine

## 2019-06-16 ENCOUNTER — Other Ambulatory Visit: Payer: Self-pay

## 2019-06-16 ENCOUNTER — Encounter: Payer: Self-pay | Admitting: Family Medicine

## 2019-06-16 ENCOUNTER — Other Ambulatory Visit: Payer: Self-pay | Admitting: Family Medicine

## 2019-06-16 VITALS — BP 152/76 | HR 86 | Temp 98.1°F | Resp 16 | Ht 64.0 in | Wt 209.0 lb

## 2019-06-16 DIAGNOSIS — R053 Chronic cough: Secondary | ICD-10-CM

## 2019-06-16 DIAGNOSIS — I10 Essential (primary) hypertension: Secondary | ICD-10-CM

## 2019-06-16 DIAGNOSIS — E782 Mixed hyperlipidemia: Secondary | ICD-10-CM

## 2019-06-16 DIAGNOSIS — R05 Cough: Secondary | ICD-10-CM

## 2019-06-16 MED ORDER — FAMOTIDINE 40 MG PO TABS: 40.0000 mg | ORAL_TABLET | Freq: Every day | ORAL | 3 refills | Status: DC

## 2019-06-16 MED ORDER — CYCLOBENZAPRINE HCL 5 MG PO TABS: 5.0000 mg | ORAL_TABLET | Freq: Three times a day (TID) | ORAL | 1 refills | Status: DC | PRN

## 2019-06-16 MED ORDER — TRAMADOL HCL 50 MG PO TABS: 50.0000 mg | ORAL_TABLET | Freq: Four times a day (QID) | ORAL | 0 refills | Status: DC | PRN

## 2019-06-16 MED FILL — CYCLOBENZAPRINE HCL 5 MG TA: 5 | 30 days supply | Qty: 90 | Fill #0

## 2019-06-16 NOTE — Progress Notes (Signed)
Subjective:    Patient ID: Jennifer Wu, female    DOB: Nov 19, 1940, 79 y.o.   MRN: [id number]  HPI  05/15/16 Lab work obtained at the last visit was unremarkable.  However patient had to discontinue lisinopril due to a worsening cough.  She stopped the lisinopril 2 days ago.  She is here today with her daughter.  They state that she has had a cough for years.  It seemed to worsen with the lisinopril.  The patient states she has a very sensitive gag reflex.  Due to all the coughing, it would constantly trigger her gag reflex and make her vomit.  She is vomiting multiple times a day according to her.  She is lost 5 pounds since her last visit.  In fact she is afraid to eat due to the vomiting.  Therefore I have a patient with 5 pounds weight loss, daily vomiting, and a chronic cough.  She also reports reflux and indigestion almost on a daily basis.  Her blood pressure today remains elevated at 163/97.  At the present time she is only taking amlodipine.  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  Patient could possibly have a hiatal hernia with constant reflux triggering the cough.  This could also explain the indigestion.  The cough seems to be triggering her gag reflex and causing vomiting.  Therefore I will empirically start the patient on Protonix 40 mg a day and recheck the patient in 2 weeks to see if she is noticing improvement.  Given the vomiting and weight loss, I will also consult GI for possible EGD to evaluate for gastric outlet obstruction, hiatal hernia, gastritis.  Patient will continue amlodipine for blood pressure but I will add hydrochlorothiazide 25 mg a day and recheck blood pressure in 2 weeks  05/29/17 She continues to cough.  Cough is some better but not totally better.  Unfortunately she continues to have episodes of nausea and vomiting related to the cough.  Indigestion is much better although she is continues to have acid reflux.   She denies any hemoptysis.  She denies any shortness of breath.  She denies any chest pain.  Blood pressure today is much better although still not at goal.  Studies did not show severe renal artery stenosis.  At that time, my plan was: Given the chronic nature of the cough, I have recommended a chest x-ray.  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  I have recommended increasing Protonix to 40 mg p.o. twice daily as well as adding tramadol 50 mg every 8 hours as needed for cough, nerve irritation in the throat.  Recheck in 2-3 weeks.  Hopefully at that point cough will have improved.  I will tolerate the elevated blood pressure today to allow the patient more time to adjust to her new medication.  If still greater than 140/90 in 3 weeks, will likely need additional medication.   03/12/18 Patient states that after I saw her in March, the cough improved dramatically.  She was able to wean herself off tramadol gradually and was doing well throughout the summer.  However a couple months ago, the cough returned.  It occurred at the end of the fall after she was down at the beach cleaning their beach house which was full of dust and possibly mold.  This led to  a sinusitis with extensive postnasal drip.  Also she had decrease her Protonix to once a day under the care of her gastroenterologist and to discontinue the Tramadol.  She was taking Xyzal but she has not been taking any nasal steroid spray.  She was seen by my partner recently and was put on Augmentin for possible sinusitis, started on Singulair for allergies, continued on Protonix, and was given Hycodan.  Symptoms have improved dramatically since taking Hycodan and antibiotics.  She is here today for follow-up.  She is now down to using the Hycodan every other day just for coughing spells.  She denies any wheezing.   At that time, my plan was: I still believe this is multifactorial patient has moderate to severe acid reflux.  She has postnasal drip and potentially allergies and sinusitis.  She also has underlying history of asthma.  When her cough becomes severe, the patient will cough so hard and so forcefully that she will vomit and turn red and almost pass out.  I believe the violent repetitive nature of her coughing also then causes upper airway irritation that triggers a cycle of coughing and spasm causing nerve irritation in her throat.  Therefore I believe we need to focus on prevention.  I have recommended continuing Protonix 40 mg a day.  I have recommended adding Pepcid 20 mg a day for acid reflux.  I recommended continuing Xyzal 5 mg a day.  I would discontinue Singulair and replace with Flonase 2 sprays each nostril daily.  I have recommended that the patient continue to wean herself away from opiate cough medication to avoid dependency and habituation but I do believe that the cough medication helps break the cycle of coughing but only exacerbates and prolongs the problem.  If the patient continues to do well recheck in 1 month.  If coughing worsens, I would perform pulmonary function test to evaluate for underlying COPD that may require maintenance therapy  04/12/18 Patient seems to have worsened.  She is coughed at least 7 times while the nurses checking her in.  Coughing spells are still extremely exaggerated and often lead to posttussive emesis.  Patient thinks she was doing better when she was taking Singulair prior to the Flonase.  She denies any shortness of breath.  She denies any wheezing.  She denies any chest pain or fevers or hemoptysis.  She denies any pleurisy.  On pulmonary exam today, her lungs are here to auscultation.  I hear no wheezes crackles or rails.  She is compliant with both Protonix and Pepcid.  She is taking Xyzal and Flonase.  She does have Xopenex that she uses on an as-needed basis.   However she tends to use Xopenex only when her coughing spells are so bad that she starts gasping for breath.  She is on no preventative.  I see no history of pulmonary function test in her chart.  At that time, my plan was: I still believe the chronic cough is multifactorial.  At this point I would perform pulmonary function test to evaluate for underlying obstructive lung disease and consult pulmonology.  Patient may benefit from an inhaled steroid particularly if there is an obstructive pattern on her pulmonary function test.  PFTs show an FEV1 of 2.3 L which is 118% of predicted.  Shows an FVC of 2.86 L which is 107% of predicted.  Her FEV1 to FVC ratio is 81% which is normal.  There is no evidence of an obstructive or restrictive pattern.  Therefore him on a consult pulmonology regarding her current cough.  Chronic and likely irritant in nature.  I will empirically try the patient on Asmanex 220 mcg inhaled once daily and I gave the patient samples to last 2 weeks in case this is some type of cough variant asthma.  If cough is not improving at that point I have exhausted what I know to try and I would recommend pulmonology expert opinion.  06/16/19 Unfortunately since the last time I saw the patient, her daughter was diagnosed with endometrial cancer that is metastatic to the lymph nodes.  Patient seems to be handling this as well as can be expected.  She has been seeing her pulmonologist and is now on a combination of Protonix, Pepcid, Flonase, Singulair, and Asmanex for her chronic cough.  She is doing better but she still has days where she will cough until she vomits.  She was taking tramadol for a fractured shoulder that occurred last summer and fall.  The tramadol helps some with her cough and I am fine with her using that on bad days when she is coughing to the point of experiencing posttussive emesis.  She also is using Flexeril occasionally for aching pain in her legs due to chronic venous  insufficiency.  I recommended trying horse Chestnut seed extract and she is also seeing a vein specialist and wearing thigh-high compression hose.  She would still like to have Flexeril on hand to be used sparingly when needed.  She is taking Lipitor.  She denies any myalgias or right upper quadrant pain.  She is due to recheck her cholesterol.  She is on amlodipine and hydrochlorothiazide.  Her blood pressure here today is elevated however she is checking it regularly at home and is in the 120s to 130s over 70-80. Past Medical History:  Diagnosis Date  . Arthritis    knees - otc med prn  . Cancer ([place]) 2000-rt lumpectomy-no bp rt arm  . Closed fracture of left proximal humerus   . GERD (gastroesophageal reflux disease)   . High cholesterol   . Mixed dyslipidemia   . 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 Seasonal allergies   . Vitamin D deficiency    Past Surgical History:  Procedure Laterality Date  . BACK SURGERY    . BREAST SURGERY    . COLONOSCOPY    . [place]   [facility]  . 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 ELBOW SURGERY    . FRACTURE SURGERY  rt foot-1990  . HYSTEROSCOPY WITH D & C  06/09/2011   Procedure: DILATATION AND CURETTAGE /HYSTEROSCOPY;  Surgeon: Cheri Fowler, MD;  Location: [facility];  Service: Gynecology;  Laterality: N/A;  . KNEE SURGERY  10/12   right knee  . LUMBAR LAMINECTOMY  1991  . LYMPH NODE BIOPSY     right breast  . RADIAL HEAD IMPLANT  02/01/2011   Procedure: RADIAL HEAD IMPLANT;  Surgeon: Schuyler Amor, MD;  Location: [facility];  Service: Orthopedics;  Laterality: Left;  left radial head replacement  . SVD     x 3   Current Outpatient Medications on File Prior to Visit  Medication Sig Dispense Refill  . ALPRAZolam (XANAX) 0.5 MG tablet Take 0.5 mg by mouth as needed for anxiety.     Marland Kitchen amLODipine (  NORVASC) 5 MG tablet TAKE 1 TABLET (5 MG TOTAL) BY MOUTH DAILY. 90 tablet 1  . atorvastatin (LIPITOR) 20 MG tablet TAKE 1 TABLET (20 MG TOTAL) BY MOUTH DAILY. 90 tablet 3  . chlorpheniramine (CHLOR-TRIMETON) 4 MG tablet Take 4 mg by mouth every 4 (four) hours as needed for allergies.    . fluticasone (FLONASE) 50 MCG/ACT nasal spray fluticasone propionate 50 mcg/actuation nasal spray,suspension    . hydrochlorothiazide (HYDRODIURIL) 25 MG tablet TAKE 1 TABLET BY MOUTH DAILY. 90 tablet 1  . ibuprofen (ADVIL,MOTRIN) 200 MG tablet Take 400-600 mg by mouth every 6 (six) hours as needed for headache or moderate pain.    Marland Kitchen levalbuterol (XOPENEX) 0.63 MG/3ML nebulizer solution levalbuterol 0.63 mg/3 mL solution for nebulization  USE 1 VIAL IN NEBULIER EVERY 8 HOURS AS NEEDED FOR WHEEZE/SHORTNESS OF BREATH    . montelukast (SINGULAIR) 10 MG tablet TAKE 1 TABLET BY MOUTH AT BEDTIME. 30 tablet 7  . Nutritional Supplements (VARICOSE VEINS FORMULA PO) Take by mouth daily.    Marland Kitchen oxybutynin (DITROPAN-XL) 10 MG 24 hr tablet Take 1 tablet by mouth daily.    . pantoprazole (PROTONIX) 40 MG tablet TAKE 1 TABLET BY MOUTH TWO TIMES DAILY 60 tablet 2   No current facility-administered medications on file prior to visit.   Allergies  Allergen Reactions  . Lisinopril Nausea And Vomiting   Social History   Socioeconomic History  . Marital status: Married    Spouse name: Not on file  . Number of children: Not on file  . Years of education: Not on file  . Highest education level: Not on file  Occupational History  . Not on file  Tobacco Use  . Smoking status: Never Smoker  . Smokeless tobacco: Never Used  Substance and Sexual Activity  . Alcohol use: No  . Drug use: No  . Sexual activity: Not on file  Other Topics Concern  . Not on file  Social History Narrative  . Not on file   Social Determinants of Health   Financial Resource Strain:   . Difficulty of Paying Living Expenses:    Food Insecurity:   . Worried About Charity fundraiser in the Last Year:   . Arboriculturist in the Last Year:   Transportation Needs:   . Film/video editor (Medical):   Marland Kitchen Lack of Transportation (Non-Medical):   Physical Activity:   . Days of Exercise per Week:   . Minutes of Exercise per Session:   Stress:   . Feeling of Stress :   Social Connections:   . Frequency of Communication with Friends and Family:   . Frequency of Social Gatherings with Friends and Family:   . Attends Religious Services:   . Active Member of Clubs or Organizations:   . Attends Archivist Meetings:   Marland Kitchen Marital Status:   Intimate Partner Violence:   . Fear of Current or Ex-Partner:   . Emotionally Abused:   Marland Kitchen Physically Abused:   . Sexually Abused:       Review of Systems  All other systems reviewed and are negative.      Objective:   Physical Exam  Constitutional: She appears well-developed and well-nourished. No distress.  Neck: No JVD present. No thyromegaly present.  Cardiovascular: Normal rate, regular rhythm and intact distal pulses.  Murmur heard. Pulmonary/Chest: Effort normal and breath sounds normal. No respiratory distress. She has no wheezes. She has no rales.  Abdominal: Soft. Bowel sounds  are normal. She exhibits no distension. There is no abdominal tenderness. There is no rebound.  Musculoskeletal:        General: Edema present.  Skin: She is not diaphoretic.  Vitals reviewed.         Assessment & Plan:   Benign essential HTN - Plan: CBC with Differential/Platelet, COMPLETE METABOLIC PANEL WITH GFR, Lipid panel  Chronic cough  Mixed dyslipidemia  I offer my condolences regarding her daughter however the patient seems to have an excellent attitude about the entire situation and seems to be dealing with it well.  Her blood pressure here is elevated however her home blood pressures are well controlled and therefore I will make no changes to her  antihypertensives.  I will check a fasting lipid panel to monitor the management of her hyperlipidemia on Lipitor.  Her goal LDL cholesterol be less than 100.  I will give her tramadol 50 mg every 8 hours as needed to be used for cough.  I instructed her to use this sparingly only on days when she is experiencing severe cough causing posttussive emesis.  She can also use Flexeril sparingly as needed for aching pain in her legs at night due to venous insufficiency.  I cautioned her about falls and dizziness on these medication.  Otherwise she is doing well with no concerns.  I did refill her Pepcid today as she requires both an antihistamine as well as a proton pump inhibitor to help with her laryngoesophageal reflux which contributes to her chronic cough

## 2019-06-18 ENCOUNTER — Other Ambulatory Visit: Payer: Self-pay | Admitting: Family Medicine

## 2019-06-18 LAB — COMPLETE METABOLIC PANEL WITH GFR
AG Ratio: 1.4 (calc) (ref 1.0–2.5)
ALT: 10 U/L (ref 6–29)
AST: 14 U/L (ref 10–35)
Albumin: 4.1 g/dL (ref 3.6–5.1)
Alkaline phosphatase (APISO): 96 U/L (ref 37–153)
BUN: 9 mg/dL (ref 7–25)
CO2: 33 mmol/L — ABNORMAL HIGH (ref 20–32)
Calcium: 9.9 mg/dL (ref 8.6–10.4)
Chloride: 94 mmol/L — ABNORMAL LOW (ref 98–110)
Creat: 0.72 mg/dL (ref 0.60–0.93)
GFR, Est African American: 94 mL/min/{1.73_m2} (ref >=60)
GFR, Est Non African American: 81 mL/min/{1.73_m2} (ref >=60)
Globulin: 2.9 g/dL (calc) (ref 1.9–3.7)
Glucose, Bld: 121 mg/dL — ABNORMAL HIGH (ref 65–99)
Potassium: 3.9 mmol/L (ref 3.5–5.3)
Sodium: 136 mmol/L (ref 135–146)
Total Bilirubin: 0.5 mg/dL (ref 0.2–1.2)
Total Protein: 7 g/dL (ref 6.1–8.1)

## 2019-06-18 LAB — CBC WITH DIFFERENTIAL/PLATELET
Absolute Monocytes: 713 cells/uL (ref 200–950)
Basophils Absolute: 60 cells/uL (ref 0–200)
Basophils Relative: 0.8 %
Eosinophils Absolute: 240 cells/uL (ref 15–500)
Eosinophils Relative: 3.2 %
HCT: 40.4 % (ref 35.0–45.0)
Hemoglobin: 13.3 g/dL (ref 11.7–15.5)
Lymphs Abs: 2010 cells/uL (ref 850–3900)
MCH: 27.8 pg (ref 27.0–33.0)
MCHC: 32.9 g/dL (ref 32.0–36.0)
MCV: 84.5 fL (ref 80.0–100.0)
MPV: 10.7 fL (ref 7.5–12.5)
Monocytes Relative: 9.5 %
Neutro Abs: 4478 cells/uL (ref 1500–7800)
Neutrophils Relative %: 59.7 %
Platelets: 303 10*3/uL (ref 140–400)
RBC: 4.78 10*6/uL (ref 3.80–5.10)
RDW: 12.7 % (ref 11.0–15.0)
Total Lymphocyte: 26.8 %
WBC: 7.5 10*3/uL (ref 3.8–10.8)

## 2019-06-18 LAB — LIPID PANEL
Cholesterol: 158 mg/dL (ref <200)
HDL: 46 mg/dL — ABNORMAL LOW (ref >=50)
LDL Cholesterol (Calc): 85 mg/dL (calc)
Non-HDL Cholesterol (Calc): 112 mg/dL (calc) (ref <130)
Total CHOL/HDL Ratio: 3.4 (calc) (ref <5.0)
Triglycerides: 177 mg/dL — ABNORMAL HIGH (ref <150)

## 2019-06-18 LAB — TEST AUTHORIZATION

## 2019-06-18 LAB — HEMOGLOBIN A1C W/OUT EAG: Hgb A1c MFr Bld: 6.6 % of total Hgb — ABNORMAL HIGH (ref <5.7)

## 2019-06-18 MED FILL — ATORVASTATIN 20 MG TABLET: 20 | 90 days supply | Qty: 90 | Fill #0

## 2019-06-18 MED FILL — traMADol HCL 50 MG TABS: 50 | 7 days supply | Qty: 30 | Fill #0

## 2019-06-19 ENCOUNTER — Encounter: Payer: Self-pay | Admitting: Family Medicine

## 2019-06-19 DIAGNOSIS — E118 Type 2 diabetes mellitus with unspecified complications: Secondary | ICD-10-CM | POA: Insufficient documentation

## 2019-07-07 MED FILL — FAMOTIDINE 40 MG TABLET: 40 | 90 days supply | Qty: 90 | Fill #0

## 2019-07-07 MED FILL — PANTOPRAZOLE SOD DR 40 MG T: 40 | 30 days supply | Qty: 60 | Fill #2

## 2019-07-07 MED FILL — MONTELUKAST SOD 10 MG TAB: 10 | 30 days supply | Qty: 30 | Fill #1

## 2019-07-07 MED FILL — GABAPENTIN 100 MG CAPSULE: 100 | 30 days supply | Qty: 120 | Fill #2

## 2019-08-11 MED FILL — HYDROCHLOROTHIAZIDE 25 MG T: 25 | 90 days supply | Qty: 90 | Fill #1

## 2019-08-12 ENCOUNTER — Ambulatory Visit: Payer: Medicare PPO | Admitting: Internal Medicine

## 2019-08-18 ENCOUNTER — Encounter: Payer: Self-pay | Admitting: Internal Medicine

## 2019-08-18 ENCOUNTER — Ambulatory Visit: Payer: Medicare PPO | Admitting: Internal Medicine

## 2019-08-18 ENCOUNTER — Other Ambulatory Visit: Payer: Self-pay

## 2019-08-18 DIAGNOSIS — J45991 Cough variant asthma: Secondary | ICD-10-CM | POA: Diagnosis not present

## 2019-08-18 DIAGNOSIS — J302 Other seasonal allergic rhinitis: Secondary | ICD-10-CM

## 2019-08-18 MED ORDER — GABAPENTIN 100 MG PO CAPS: 100.0000 mg | ORAL_CAPSULE | Freq: Four times a day (QID) | ORAL | 2 refills | Status: DC

## 2019-08-18 MED ORDER — PREDNISONE 10 MG PO TABS: ORAL_TABLET | ORAL | 0 refills | Status: DC

## 2019-08-18 MED FILL — predniSONE 10 MG TABS: 10 | 6 days supply | Qty: 14 | Fill #0

## 2019-08-18 MED FILL — GABAPENTIN 100 MG CAPSULE: 100 | 30 days supply | Qty: 120 | Fill #0

## 2019-08-18 NOTE — Progress Notes (Signed)
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    History of Present Illness  04/29/2018  Pulmonary/ 1st office eval/Jennifer Wu  Chief Complaint  Patient presents with  . Consult    Chronic Cough  Dyspnea:  Not limited by breathing from desired activities   Cough: mostly dry  Day > noct  Sleep: on side bed flat / nose clogs up  SABA use: no better  Using lots of mints  rec Plan A = Automatic = asmanex 100 Take 2 puffs first thing in am and then another 2 puffs about 12 hours later.  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 Follow-up    States her cough has gotten better but still there. Would like refill of hycodan to have on stand by.   Dyspnea:  Not limited by breathing from desired activities   Cough: worse when heads hits pillow / worse with asmanex and with cornbread / no bettter with tessalon or delsym - note this is not the hx she gave 04/29/2018 which was day > noct  Sleeping: flat bed  SABA use: xopenex ? Helping /rarely uses  02: none  Tends to ad lib on maint rx  rec For drainage / throat tickle try take CHLORPHENIRAMINE  4 mg (chlortabs 4 mg) - take one every 4 hours as needed - available over the counter- may cause drowsiness so take 1 or 2 1 hours before bedtime   and see how you tolerate it before trying in daytime   Pantoprazole (protonix) 40 mg   Take  30-60 min before  first meal of the day and Pepcid (famotidine)  20 mg one @  bedtime until return to office - this is the best way to tell whether stomach acid is contributing to your problem.   Stop night time asmanex  and restart singulair 10 mg each pm   If continue to cough after a week on the combination, then stop the asmanex 200  and continue singulair If get worse > xopenex is as needed     Telemed 06/25/2018 rec  refilled  asmanex 200 to continue 2 puffs each am > changed to flovent 110 by insurance No change in recommendations vs prior office eval     08/12/2018  f/u ov/Jennifer Wu re: cough x 40 years thinks better on asmanex than flovent per formulary restriction  Chief Complaint  Patient presents with  . Follow-up    Breathing is "fine".   Dyspnea:  Not limited but very sedentary  Cough: more sporadic, better at bedtime after flovent 110 (vs better on asmanex 200  Sleeping: one pillow, bed is flat - dog not in bedroom/no bed blocks as advised /better p one h1 but still some noct "drainage and cough" > never  increased to 2 at bedtime as previously recommended SABA use: none 02: none  Very disorgainized with meds -she did not separate her maintenance versus PRN's but brought all her medicines including medicine she does not take anymore.  She has a pillbox but it is only good for 1 dosing per day and in no way matches up to her regimen.  She says she can tell what the medicines are by their colors and scatters them " through the week"  since there is only 1 slot per day. rec Asmanex 100 Take 2 puffs first thing in am and then another 2 puffs about 12 hours later.  Stop flovent  Chlorpheniramine x 2 one hour before bed along with the pepcid 40 mg    09/10/2018  f/u ov/Jennifer Wu re: 40 year cough better, a/c not working all summer and since hot the cough is worse though not using bed blocks or asmanex 200 consistently  Chief Complaint  Patient presents with  . Follow-up    C/O chronic cough that seems to  be getting worse over the last week or 2.   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 Follow-up    Breathing is doing well. Her cough is unchanged.   Dyspnea:  Not limited by breathing from desired activities including walking in woods  Cough: dry/gagging  to point of face turning red up to 10 x daily no pattern and says 24/7 now  Sleeping: no bed blocks/ has wedge still coughing despite h1 hs  SABA use: none  02: none  rec Pantoprazole 40mg  Take 30- 60 min before your first and last meals of the day and pepcid 40 mg one hour before bedtime  Prednisone 10 mg take  4 each am x 2 days,   2 each am x 2 days,  1 each am x 2 days and stop  Decrease asmanex 200 Take 1 puffs first thing in am and then another 1 puffs about 12 hours later.  Gabapentin 100 mg four times daily  For drainage / throat tickle try take CHLORPHENIRAMINE  4 mg  Please schedule a follow up office visit in 2 weeks, sooner if needed  with all medications /inhalers/ solutions in hand so we can verify exactly what you are taking. This includes all medications from all doctors and over the counters    05/13/2019  f/u ov/Jennifer Wu re: cough since 1964 finally improving on gabapentin Chief Complaint  Patient presents with  . Follow-up    Cough is doing better since the last visit.   Dyspnea:   Not limited by breathing from desired activities  / still doing some walking when the  weather is good  Cough: much better unless around strong smells Sleeping:  Better p chlorpheniramine x 2 at hs  SABA use: none  02:  none  rec No change in medications For drainage / throat tickle try take CHLORPHENIRAMINE  4 mg  (Chlortab 4mg   at [facility] should be easiest to find in the green box)  take one- two  every 4 hours as needed    08/18/2019  f/u ov/Jennifer Wu re: cough since 1964  asmanex 100 2 q 12 hours can't tell it's helping  Chief Complaint  Patient presents with  . Follow-up    Breathing is "fine" no new co's today.   Dyspnea:  Not limited by breathing from desired activities   Cough:  Noct Worse x 2 weeks assoc with sense of pnds despite singulair   Sleeping: cough at hs despite chlorpheniramine x 2  SABA use: none  02: none    No obvious day to day or daytime variability or assoc excess/ purulent sputum or mucus plugs or hemoptysis or cp or chest tightness, subjective wheeze  or hb symptoms.    . Also denies any obvious fluctuation of symptoms with weather or environmental changes or other aggravating or alleviating factors except as outlined above   No unusual exposure hx or h/o childhood pna/ asthma or knowledge of premature birth.  Current Allergies, Complete Past Medical History, Past Surgical History, Family History, and Social History were reviewed in [facility] record.  ROS  The following are not active complaints unless bolded Hoarseness, sore throat, dysphagia, dental problems, itching, sneezing,  nasal congestion or discharge of excess mucus or purulent secretions, ear ache,   fever, chills, sweats, unintended wt loss or wt gain, classically pleuritic or exertional cp,  orthopnea pnd or arm/hand swelling  or leg swelling, presyncope, palpitations, abdominal pain, anorexia, nausea, vomiting, diarrhea  or change in bowel habits or change in bladder habits, change in stools or change in urine, dysuria, hematuria,  rash, arthralgias,  visual complaints, headache, numbness, weakness or ataxia or problems with walking or coordination,  change in mood or  memory.        Current Meds - - NOTE:   Unable to verify as accurately reflecting what pt takes     Medication Sig  . ALPRAZolam (XANAX) 0.5 MG tablet Take 0.5 mg by mouth as needed for anxiety.  Marland Kitchen amLODipine (NORVASC) 5 MG tablet TAKE 1 TABLET (5 MG TOTAL) BY MOUTH DAILY.  Marland Kitchen atorvastatin (LIPITOR) 20 MG tablet TAKE 1 TABLET BY MOUTH DAILY.  . chlorpheniramine (CHLOR-TRIMETON) 4 MG tablet Take 4 mg by mouth every 4 (four) hours as needed for allergies.  . cyclobenzaprine (FLEXERIL) 5 MG tablet Take 1 tablet (5 mg total) by mouth 3 (three) times daily as needed for muscle spasms.  . famotidine (PEPCID) 40 MG tablet Take 1 tablet (40 mg total) by mouth daily.  . fluticasone (FLONASE) 50 MCG/ACT nasal spray fluticasone propionate 50 mcg/actuation nasal spray,suspension  . hydrochlorothiazide (HYDRODIURIL) 25 MG tablet TAKE 1 TABLET BY MOUTH DAILY.  Marland Kitchen ibuprofen (ADVIL,MOTRIN) 200 MG tablet Take 400-600 mg by mouth every 6 (six) hours as needed for headache or moderate pain.  Marland Kitchen levalbuterol (XOPENEX) 0.63 MG/3ML nebulizer solution levalbuterol 0.63 mg/3 mL solution for nebulization  USE 1 VIAL IN NEBULIER EVERY 8 HOURS AS NEEDED FOR WHEEZE/SHORTNESS OF BREATH  . montelukast (SINGULAIR) 10 MG tablet TAKE 1 TABLET BY MOUTH AT BEDTIME.  . Nutritional Supplements (VARICOSE VEINS FORMULA PO) Take by mouth daily.  . pantoprazole (PROTONIX) 40 MG tablet TAKE 1 TABLET BY MOUTH TWO TIMES DAILY  . traMADol (ULTRAM) 50 MG tablet Take  1 tablet (50 mg total) by mouth every 6 (six) hours as needed (for cough).                       Objective:     amb somber wf nad   08/18/2019       201  05/13/2019      209  04/29/2019         202  10/22/2018       198    09/10/2018       198  08/12/2018       201   05/28/18 198 lb (89.8 kg)  04/29/18 203 lb (92.1 kg)  04/12/18 196 lb (88.9 kg)      Vital signs reviewed  08/18/2019  - Note at rest 02 sats  100% on RA     HEENT : pt wearing mask not removed for exam due to covid -19 concerns.    NECK :  without JVD/Nodes/TM/ nl carotid upstrokes bilaterally   LUNGS: no acc muscle use,  Nl contour chest which is clear to A and P bilaterally without cough on insp or exp maneuvers   CV:  RRR  no s3 or murmur or increase in P2, and no edema   ABD:  soft and nontender with nl inspiratory excursion in the supine position. No bruits or organomegaly appreciated, bowel sounds nl  MS:  Nl gait/ ext warm without deformities, calf tenderness, cyanosis or clubbing No obvious joint restrictions   SKIN: warm and dry without lesions    NEURO:  alert, approp, nl sensorium with  no motor or cerebellar deficits apparent.                     Assessment

## 2019-08-18 NOTE — Patient Instructions (Addendum)
Prednisone 10 mg take  4 each am x 2 days,   2 each am x 2 days,  1 each am x 2 days and stop   Try off asmanex but restart if worse cough/ wheeze/ short ness of breath  Continue gabapentin at 100 mg four times a days   Call me with any discrepancy on your medication   Please schedule a follow up visit in 3 months but call sooner if needed - call for allergy eval if not satisfied

## 2019-08-19 ENCOUNTER — Encounter: Payer: Self-pay | Admitting: Internal Medicine

## 2019-08-19 NOTE — Assessment & Plan Note (Addendum)
Onset probably p  CAP ? 1964 - partial resp to singulair then much better on asmanex but still noct cough and daytime throat clearing - Spirometry 04/29/2018  Completely nl - FENO 04/29/2018  =   11 off all rx  - 04/29/2018   try asmanex 100 2bid x 4 week sample - 05/28/2018   try asmanex 200  x 2 in am only and add noct 1st gen H1 blockers per guidelines  / bed blocks  - Allergy profile 05/28/2018 >  Eos 0.2 /  IgE  66  RAST pos dog dust cockroach  - 08/12/2018    since worse on flovent rec asmanex sample 2bid and increase noct 1st gen H1 blockers per guidelines  To 2 q hs > did not do  - 09/10/2018  After extensive coaching inhaler device,  effectiveness =    75% with hfa so challenge with sample of asmanex 100 4 bid then taper to 2 bid with addition of 1st gen H1 blockers per guidelines   - 04/29/2019  After extensive coaching inhaler device,  effectiveness =    75% (Ti too short) and makes her cough worse so reduce dose to asthmanex 200 one bid and added trial of gabapentin 100 qid > improved 05/13/2019 so rec same rx x 3 m then return   Not clear to me at all that asmanex is helping with this cough since 1964 - gabapentin is probably more important   rec  Try off asmanex. NB the  ramp to expected improvement in symptoms from an empiric trial of ICS (and for that matter, worsening, if a chronic effective medication is stopped)  can be measured in weeks, not days, a common misconception because this is not the same as treating wheeze/sob  (no immediate cause and effect relationship)  so that response to therapy or lack thereof can be very difficult to assess  If get worse ok to start back on asmanex   >> advised on how to do med reconciliation at home         Each maintenance medication was reviewed in detail including emphasizing most importantly the difference between maintenance and prns and under what circumstances the prns are to be triggered using an action plan format where appropriate.  Total  time for H and P, chart review, counseling, teaching device and generating customized AVS unique to this office visit / charting = 20 min

## 2019-08-19 NOTE — Assessment & Plan Note (Addendum)
-  Allergy profile 05/28/2018 >  Eos 0.2 /  IgE  66  RAST pos dog dust cockroach    Try Prednisone 10 mg take  4 each am x 2 days,   2 each am x 2 days,  1 each am x 2 days and stop > f/u formal allergy eval prn

## 2019-08-28 ENCOUNTER — Other Ambulatory Visit: Payer: Self-pay | Admitting: Family Medicine

## 2019-08-28 DIAGNOSIS — R053 Chronic cough: Secondary | ICD-10-CM

## 2019-08-28 MED FILL — AMLODIPINE BESYLATE 5 MG TA: 5 | 90 days supply | Qty: 90 | Fill #0

## 2019-08-28 MED FILL — PANTOPRAZOLE SOD DR 40 MG T: 40 | 30 days supply | Qty: 60 | Fill #0

## 2019-10-06 MED FILL — FAMOTIDINE 40 MG TABLET: 40 | 90 days supply | Qty: 90 | Fill #1

## 2019-10-06 MED FILL — CYCLOBENZAPRINE HCL 5 MG TA: 5 | 30 days supply | Qty: 90 | Fill #1

## 2019-10-06 MED FILL — MONTELUKAST SOD 10 MG TAB: 10 | 30 days supply | Qty: 30 | Fill #4

## 2019-10-06 MED FILL — PANTOPRAZOLE SOD DR 40 MG T: 40 | 30 days supply | Qty: 60 | Fill #1

## 2019-10-06 MED FILL — GABAPENTIN 100 MG CAPSULE: 100 | 30 days supply | Qty: 120 | Fill #1

## 2019-11-05 ENCOUNTER — Other Ambulatory Visit: Payer: Self-pay | Admitting: Family Medicine

## 2019-11-05 DIAGNOSIS — R053 Chronic cough: Secondary | ICD-10-CM

## 2019-11-05 MED FILL — FUROSEMIDE 20 MG TABS: 20 | 30 days supply | Qty: 30 | Fill #0

## 2019-11-05 MED FILL — PANTOPRAZOLE SOD DR 40 MG T: 40 | 30 days supply | Qty: 60 | Fill #0

## 2019-11-15 ENCOUNTER — Other Ambulatory Visit: Payer: Self-pay | Admitting: Family Medicine

## 2019-11-15 MED FILL — GABAPENTIN 100 MG CAPSULE: 100 | 30 days supply | Qty: 120 | Fill #2

## 2019-11-15 MED FILL — AMLODIPINE BESYLATE 5 MG TA: 5 | 90 days supply | Qty: 90 | Fill #1

## 2019-11-15 MED FILL — MONTELUKAST SOD 10 MG TAB: 10 | 30 days supply | Qty: 30 | Fill #5

## 2019-11-17 ENCOUNTER — Other Ambulatory Visit: Payer: Self-pay | Admitting: Family Medicine

## 2019-11-17 MED FILL — HYDROCHLOROTHIAZIDE 25 MG T: 25 | 90 days supply | Qty: 90 | Fill #0

## 2019-11-18 ENCOUNTER — Ambulatory Visit: Payer: Medicare PPO | Admitting: Internal Medicine

## 2019-11-24 ENCOUNTER — Other Ambulatory Visit: Payer: Self-pay | Admitting: Internal Medicine

## 2019-11-24 ENCOUNTER — Other Ambulatory Visit: Payer: Self-pay

## 2019-11-24 ENCOUNTER — Encounter: Payer: Self-pay | Admitting: Internal Medicine

## 2019-11-24 ENCOUNTER — Ambulatory Visit: Payer: Medicare PPO | Admitting: Internal Medicine

## 2019-11-24 DIAGNOSIS — J45991 Cough variant asthma: Secondary | ICD-10-CM | POA: Diagnosis not present

## 2019-11-24 MED ORDER — GABAPENTIN 300 MG PO CAPS
300.0000 mg | ORAL_CAPSULE | Freq: Three times a day (TID) | ORAL | 2 refills | Status: DC
Start: 2019-11-24 — End: 2020-04-01

## 2019-11-24 MED ORDER — PREDNISONE 10 MG PO TABS: ORAL_TABLET | ORAL | 0 refills | Status: DC

## 2019-11-24 MED FILL — GABAPENTIN 300 MG CAPSULE: 300 | 30 days supply | Qty: 90 | Fill #0

## 2019-11-24 MED FILL — predniSONE 10 MG TABS: 10 | 6 days supply | Qty: 14 | Fill #0

## 2019-11-24 NOTE — Progress Notes (Signed)
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    History of Present Illness  04/29/2018  Pulmonary/ 1st office eval/Jennifer Wu  Chief Complaint  Patient presents with  . Consult    Chronic Cough  Dyspnea:  Not limited by breathing from desired activities   Cough: mostly dry  Day > noct  Sleep: on side bed flat / nose clogs up  SABA use: no better  Using lots of mints  rec Plan A = Automatic = asmanex 100 Take 2 puffs first thing in am and then another 2 puffs about 12 hours later.  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 Follow-up    States her cough has gotten better but still there. Would like refill of hycodan to have on stand by.   Dyspnea:  Not limited by breathing from desired activities   Cough: worse when heads hits pillow / worse with asmanex and with cornbread / no bettter with tessalon or delsym - note this is not the hx she gave 04/29/2018 which was day > noct  Sleeping: flat bed  SABA use: xopenex ? Helping /rarely uses  02: none  Tends to ad lib on maint rx  rec For drainage / throat tickle try take CHLORPHENIRAMINE  4 mg (chlortabs 4 mg) - take one every 4 hours as needed - available over the counter- may cause drowsiness so take 1 or 2 1 hours before bedtime   and see how you tolerate it before trying in daytime   Pantoprazole (protonix) 40 mg   Take  30-60 min before  first meal of the day and Pepcid (famotidine)  20 mg one @  bedtime until return to office - this is the best way to tell whether stomach acid is contributing to your problem.   Stop night time asmanex  and restart singulair 10 mg each pm   If continue to cough after a week on the combination, then stop the asmanex 200  and continue singulair If get worse > xopenex is as needed     Telemed 06/25/2018 rec  refilled  asmanex 200 to continue 2 puffs each am > changed to flovent 110 by insurance No change in recommendations vs prior office eval     08/12/2018  f/u ov/Jennifer Wu re: cough x 40 years thinks better on asmanex than flovent per formulary restriction  Chief Complaint  Patient presents with  . Follow-up    Breathing is "fine".   Dyspnea:  Not limited but very sedentary  Cough: more sporadic, better at bedtime after flovent 110 (vs better on asmanex 200  Sleeping: one pillow, bed is flat - dog not in bedroom/no bed blocks as advised /better p one h1 but still some noct "drainage and cough" > never  increased to 2 at bedtime as previously recommended SABA use: none 02: none  Very disorgainized with meds -she did not separate her maintenance versus PRN's but brought all her medicines including medicine she does not take anymore.  She has a pillbox but it is only good for 1 dosing per day and in no way matches up to her regimen.  She says she can tell what the medicines are by their colors and scatters them " through the week"  since there is only 1 slot per day. rec Asmanex 100 Take 2 puffs first thing in am and then another 2 puffs about 12 hours later.  Stop flovent  Chlorpheniramine x 2 one hour before bed along with the pepcid 40 mg    09/10/2018  f/u ov/Jennifer Wu re: 40 year cough better, a/c not working all summer and since hot the cough is worse though not using bed blocks or asmanex 200 consistently  Chief Complaint  Patient presents with  . Follow-up    C/O chronic cough that seems to  be getting worse over the last week or 2.   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 Follow-up    Breathing is doing well. Her cough is unchanged.   Dyspnea:  Not limited by breathing from desired activities including walking in woods  Cough: dry/gagging  to point of face turning red up to 10 x daily no pattern and says 24/7 now  Sleeping: no bed blocks/ has wedge still coughing despite h1 hs  SABA use: none  02: none  rec Pantoprazole 40mg  Take 30- 60 min before your first and last meals of the day and pepcid 40 mg one hour before bedtime  Prednisone 10 mg take  4 each am x 2 days,   2 each am x 2 days,  1 each am x 2 days and stop  Decrease asmanex 200 Take 1 puffs first thing in am and then another 1 puffs about 12 hours later.  Gabapentin 100 mg four times daily  For drainage / throat tickle try take CHLORPHENIRAMINE  4 mg  Please schedule a follow up office visit in 2 weeks, sooner if needed  with all medications /inhalers/ solutions in hand so we can verify exactly what you are taking. This includes all medications from all doctors and over the counters    05/13/2019  f/u ov/Jennifer Wu re: cough since 1964 finally improving on gabapentin Chief Complaint  Patient presents with  . Follow-up    Cough is doing better since the last visit.   Dyspnea:   Not limited by breathing from desired activities  / still doing some walking when the  weather is good  Cough: much better unless around strong smells Sleeping:  Better p chlorpheniramine x 2 at hs  SABA use: none  02:  none  rec No change in medications For drainage / throat tickle try take CHLORPHENIRAMINE  4 mg  (Chlortab 4mg   at [employer] should be easiest to find in the green box)  take one- two  every 4 hours as needed    08/18/2019  f/u ov/Jennifer Wu re: cough since 1964  asmanex 100 2 q 12 hours can't tell it's helping  Chief Complaint  Patient presents with  . Follow-up    Breathing is "fine" no new co's today.   Dyspnea:  Not limited by breathing from desired activities   Cough:  Noct Worse x 2 weeks assoc with sense of pnds despite singulair   Sleeping: cough at hs despite chlorpheniramine x 2  SABA use: none  02: none  rec Prednisone 10 mg take  4 each am x 2 days,   2 each am x 2 days,  1 each am x 2 days and stop  Try off asmanex but restart if worse cough/ wheeze/ short ness of breath Continue gabapentin at 100 mg four times a days  Call me with any discrepancy on your medication   11/24/2019  f/u ov/Jennifer Wu re: cough x 1964  C/w uacs Chief Complaint  Patient presents with  . Follow-up    Cough with clear sputum  no cough for several months p last prednisone rx/ stayed off asmanex Dyspnea:  Not limited by breathing from desired activities   Cough: dry night and day so hard gags and vomits x sev weeks, best rx is mint  Sleeping: wedges on top of mattress  SABA use: none  02: none    No obvious day to day or daytime variability or assoc excess/ purulent sputum or mucus plugs or hemoptysis or cp or chest tightness, subjective wheeze or overt sinus or hb symptoms.    . Also denies any obvious fluctuation of symptoms with weather or environmental changes or other aggravating or alleviating factors except as outlined above   No unusual exposure hx or h/o childhood pna/ asthma or knowledge of premature birth.  Current Allergies, Complete Past Medical  History, Past Surgical History, Family History, and Social History were reviewed in [employer] record.  ROS  The following are not active complaints unless bolded Hoarseness, sore throat, dysphagia, dental problems, itching, sneezing,  nasal congestion/ sense of pnds but never any significant mucus or discharge of excess mucus or purulent secretions, ear ache,   fever, chills, sweats, unintended wt loss or wt gain, classically pleuritic or exertional cp,  orthopnea pnd or arm/hand swelling  or leg swelling, presyncope, palpitations, abdominal pain, anorexia, nausea, vomiting, diarrhea  or change in bowel habits or change in bladder habits, change in stools or change in urine, dysuria, hematuria,  rash, arthralgias, visual complaints, headache, numbness, weakness or ataxia or problems with walking or coordination,  change in mood or  memory.        Current Meds  Medication Sig  . ALPRAZolam (XANAX) 0.5 MG tablet Take 0.5 mg by mouth as needed for anxiety.  Marland Kitchen amLODipine (NORVASC) 5 MG tablet TAKE 1 TABLET BY MOUTH DAILY.  Marland Kitchen atorvastatin (LIPITOR) 20 MG tablet TAKE 1 TABLET BY MOUTH DAILY.  . chlorpheniramine (CHLOR-TRIMETON) 4 MG tablet Take 4 mg by mouth every 4 (four) hours as needed for allergies.  . cyclobenzaprine (FLEXERIL) 5 MG tablet Take 1 tablet (5 mg total) by mouth 3 (three) times daily as needed for muscle spasms.  . famotidine (PEPCID) 40 MG tablet Take 1  tablet (40 mg total) by mouth daily.  . fluticasone (FLONASE) 50 MCG/ACT nasal spray fluticasone propionate 50 mcg/actuation nasal spray,suspension  . furosemide (LASIX) 20 MG tablet   . gabapentin (NEURONTIN) 100 MG capsule Take 1 capsule (100 mg total) by mouth 4 (four) times daily.  . hydrochlorothiazide (HYDRODIURIL) 25 MG tablet TAKE 1 TABLET BY MOUTH DAILY.  Marland Kitchen ibuprofen (ADVIL,MOTRIN) 200 MG tablet Take 400-600 mg by mouth every 6 (six) hours as needed for headache or moderate pain.  Marland Kitchen levalbuterol  (XOPENEX) 0.63 MG/3ML nebulizer solution levalbuterol 0.63 mg/3 mL solution for nebulization  USE 1 VIAL IN NEBULIER EVERY 8 HOURS AS NEEDED FOR WHEEZE/SHORTNESS OF BREATH  . montelukast (SINGULAIR) 10 MG tablet TAKE 1 TABLET BY MOUTH AT BEDTIME.  . Nutritional Supplements (VARICOSE VEINS FORMULA PO) Take by mouth daily.  . pantoprazole (PROTONIX) 40 MG tablet TAKE 1 TABLET BY MOUTH TWO TIMES DAILY  . predniSONE (DELTASONE) 10 MG tablet Take  4 each am x 2 days,   2 each am x 2 days,  1 each am x 2 days and stop  . traMADol (ULTRAM) 50 MG tablet Take 1 tablet (50 mg total) by mouth every 6 (six) hours as needed (for cough).  . Vitamin D, Ergocalciferol, (DRISDOL) 1.25 MG (50000 UNIT) CAPS capsule                       Objective:      11/24/2019       207 08/18/2019       201  05/13/2019      209  04/29/2019         202  10/22/2018       198    09/10/2018       198  08/12/2018       201   05/28/18 198 lb (89.8 kg)  04/29/18 203 lb (92.1 kg)  04/12/18 196 lb (88.9 kg)       Vital signs reviewed  11/24/2019  - Note at rest 02 sats  96% on RA     HEENT : pt wearing mask not removed for exam due to covid -19 concerns.    NECK :  without JVD/Nodes/TM/ nl carotid upstrokes bilaterally   LUNGS: no acc muscle use,  Nl contour chest which is clear to A and P bilaterally without cough on insp or exp maneuvers   CV:  RRR  no s3 or murmur or increase in P2, and no edema   ABD:  soft and nontender with nl inspiratory excursion in the supine position. No bruits or organomegaly appreciated, bowel sounds nl  MS:  Nl gait/ ext warm without deformities, calf tenderness, cyanosis or clubbing No obvious joint restrictions   SKIN: warm and dry without lesions    NEURO:  alert, approp, nl sensorium with  no motor or cerebellar deficits apparent.                   Assessment

## 2019-11-24 NOTE — Patient Instructions (Addendum)
Increase gabapentin 300 mg three times daily from 200 mg three - four times daily   Do not use any mint   Prednisone 10 mg take  4 each am x 2 days,   2 each am x 2 days,  1 each am x 2 days and stop   Call if not satisfied with above for referral to Dr Carol Ada at [facility] voice center, dept ENT   IF better,   If you are satisfied with your treatment plan,  let your doctor know and he/she can either refill your medications or you can return here when your prescription runs out.     If in any way you are not 100% satisfied,  please tell us.  If 100% better, tell your friends!  Pulmonary follow up is as needed

## 2019-11-25 NOTE — Assessment & Plan Note (Addendum)
Onset probably p  CAP ? 1964 - partial resp to singulair then much better on asmanex but still noct cough and daytime throat clearing - Spirometry 04/29/2018  Completely nl - FENO 04/29/2018  =   11 off all rx  - 04/29/2018   try asmanex 100 2bid x 4 week sample - 05/28/2018   try asmanex 200  x 2 in am only and add noct 1st gen H1 blockers per guidelines  / bed blocks  - Allergy profile 05/28/2018 >  Eos 0.2 /  IgE  66  RAST pos dog dust cockroach  - 08/12/2018    since worse on flovent rec asmanex sample 2bid and increase noct 1st gen H1 blockers per guidelines  To 2 q hs > did not do  - 09/10/2018  After extensive coaching inhaler device,  effectiveness =    75% with hfa so challenge with sample of asmanex 100 4 bid then taper to 2 bid with addition of 1st gen H1 blockers per guidelines   - 04/29/2019  After extensive coaching inhaler device,  effectiveness =    75% (Ti too short) and makes her cough worse so reduce dose to asthmanex 200 one bid and added trial of gabapentin 100 qid > improved 05/13/2019 so rec same rx x 3 m then return  - increased gabapentin to 300 mg tid max 11/24/2019 >>>  Of the three most common causes of  Sub-acute / recurrent or chronic cough, only one (GERD)  can actually contribute to/ trigger  the other two (asthma and post nasal drip syndrome)  and perpetuate the cylce of cough.  While not intuitively obvious, many patients with chronic low grade reflux do not cough until there is a primary insult that disturbs the protective epithelial barrier and exposes sensitive nerve endings.   This is typically viral but can due to PNDS and  either may apply here.     >>> The point is that once this occurs, it is difficult to eliminate the cycle  using anything but a maximally effective acid suppression regimen at least in the short run, accompanied by an appropriate diet to address non acid GERD and control / eliminate the cough itself with gabapentin titrated up to 300 mg tid and if not better  > ENT eval [facility]/ Dr Joya Gaskins and also added 6 days of Prednisone in case of component of Th-2 driven upper or lower airways inflammation (if cough responds short term only to relapse befor return while will on rx for uacs that would point to allergic rhinitis/ asthma or eos bronchitis)     Discussed in detail all the  indications, usual  risks and alternatives  relative to the benefits with patient who agrees to proceed with Rx as outlined.          Each maintenance medication was reviewed in detail including emphasizing most importantly the difference between maintenance and prns and under what circumstances the prns are to be triggered using an action plan format where appropriate.  Total time for H and P, chart review, counseling,  and generating customized AVS unique to this office visit / charting = 20 min

## 2019-12-08 MED FILL — PANTOPRAZOLE SOD DR 40 MG T: 40 | 30 days supply | Qty: 60 | Fill #1

## 2019-12-22 ENCOUNTER — Other Ambulatory Visit: Payer: Self-pay | Admitting: Family Medicine

## 2019-12-22 MED FILL — ATORVASTATIN 20 MG TABLET: 20 | 90 days supply | Qty: 90 | Fill #0

## 2019-12-22 MED FILL — MONTELUKAST SOD 10 MG TAB: 10 | 30 days supply | Qty: 30 | Fill #6

## 2019-12-24 ENCOUNTER — Telehealth: Payer: Self-pay | Admitting: Family Medicine

## 2019-12-24 ENCOUNTER — Other Ambulatory Visit: Payer: Medicare PPO

## 2019-12-24 ENCOUNTER — Other Ambulatory Visit: Payer: Self-pay

## 2019-12-24 DIAGNOSIS — E118 Type 2 diabetes mellitus with unspecified complications: Secondary | ICD-10-CM

## 2019-12-24 NOTE — Telephone Encounter (Signed)
Patient stopped by to get A1C drawn and she states that the Vein and vascular doctor gave her a prescription for lasix furosemide 20 mg he is no longer following her care. However patient states that her legs are still swollen and would like Dr. Dennard Schaumann to call this in. She states that the swelling is coming from where the Vein and Vascular doctor didn't do the back of her legs. She states that he wants what he has done so far to heal before doing the back of her legs.  [place]  CB# [phone]

## 2019-12-24 NOTE — Telephone Encounter (Signed)
Ok to refill 

## 2019-12-25 ENCOUNTER — Other Ambulatory Visit: Payer: Self-pay | Admitting: Family Medicine

## 2019-12-25 LAB — HEMOGLOBIN A1C
Hgb A1c MFr Bld: 7.2 % of total Hgb — ABNORMAL HIGH (ref <5.7)
Mean Plasma Glucose: 160 (calc)
eAG (mmol/L): 8.9 (calc)

## 2019-12-25 MED ORDER — FUROSEMIDE 20 MG PO TABS: 20.0000 mg | ORAL_TABLET | Freq: Every day | ORAL | 2 refills | Status: DC

## 2019-12-25 MED FILL — FUROSEMIDE 20 MG TABS: 20 | 30 days supply | Qty: 30 | Fill #0

## 2019-12-25 NOTE — Telephone Encounter (Signed)
I sent it in 

## 2020-01-14 MED FILL — FAMOTIDINE 40 MG TABLET: 40 | 90 days supply | Qty: 90 | Fill #2

## 2020-01-14 MED FILL — PANTOPRAZOLE SOD DR 40 MG T: 40 | 30 days supply | Qty: 60 | Fill #2

## 2020-01-14 MED FILL — GABAPENTIN 300 MG CAPSULE: 300 | 30 days supply | Qty: 90 | Fill #1

## 2020-01-16 MED FILL — MONTELUKAST SOD 10 MG TAB: 10 | 30 days supply | Qty: 30 | Fill #7

## 2020-01-23 MED FILL — FUROSEMIDE 20 MG TABS: 20 | 30 days supply | Qty: 30 | Fill #1

## 2020-02-26 ENCOUNTER — Other Ambulatory Visit: Payer: Self-pay | Admitting: Family Medicine

## 2020-02-26 ENCOUNTER — Other Ambulatory Visit: Payer: Self-pay | Admitting: Internal Medicine

## 2020-02-26 DIAGNOSIS — R053 Chronic cough: Secondary | ICD-10-CM

## 2020-02-26 DIAGNOSIS — J45991 Cough variant asthma: Secondary | ICD-10-CM

## 2020-02-26 MED FILL — AMLODIPINE BESYLATE 5 MG TA: 5 | 90 days supply | Qty: 90 | Fill #0

## 2020-02-26 MED FILL — GABAPENTIN 300 MG CAPSULE: 300 | 30 days supply | Qty: 90 | Fill #2

## 2020-02-26 MED FILL — PANTOPRAZOLE SOD DR 40 MG T: 40 | 30 days supply | Qty: 60 | Fill #0

## 2020-02-26 MED FILL — HYDROCHLOROTHIAZIDE 25 MG T: 25 | 90 days supply | Qty: 90 | Fill #1

## 2020-02-26 MED FILL — MONTELUKAST SOD 10 MG TAB: 10 | 30 days supply | Qty: 30 | Fill #0

## 2020-02-27 MED FILL — FUROSEMIDE 20 MG TABS: 20 | 30 days supply | Qty: 30 | Fill #2

## 2020-03-01 ENCOUNTER — Other Ambulatory Visit (HOSPITAL_COMMUNITY): Payer: Self-pay | Admitting: Gastroenterology

## 2020-03-01 MED FILL — SUPREP BOWEL PREP KIT: 17.5-3.13-1 | 2 days supply | Qty: 354 | Fill #0

## 2020-03-11 MED FILL — SUPREP BOWEL PREP KIT: 17.5-3.13-1 | 2 days supply | Qty: 354 | Fill #0

## 2020-03-17 MED FILL — ATORVASTATIN CALCIUM 20 MG: 20 | 90 days supply | Qty: 90 | Fill #1

## 2020-03-30 ENCOUNTER — Encounter: Payer: Self-pay | Admitting: Family Medicine

## 2020-04-01 ENCOUNTER — Other Ambulatory Visit: Payer: Self-pay | Admitting: Internal Medicine

## 2020-04-01 ENCOUNTER — Other Ambulatory Visit: Payer: Self-pay | Admitting: Family Medicine

## 2020-04-01 MED FILL — GABAPENTIN 300 MG CAPSULE: 300 | 30 days supply | Qty: 90 | Fill #0

## 2020-04-01 MED FILL — PANTOPRAZOLE SOD DR 40 MG T: 40 | 30 days supply | Qty: 60 | Fill #1

## 2020-04-01 MED FILL — FUROSEMIDE 20 MG TABS: 20 | 30 days supply | Qty: 30 | Fill #0

## 2020-04-01 MED FILL — MONTELUKAST SOD 10 MG TAB: 10 | 30 days supply | Qty: 30 | Fill #1

## 2020-04-08 ENCOUNTER — Telehealth: Payer: Self-pay | Admitting: Family Medicine

## 2020-04-08 LAB — HM MAMMOGRAPHY

## 2020-04-08 NOTE — Telephone Encounter (Signed)
error 

## 2020-05-06 ENCOUNTER — Emergency Department (HOSPITAL_COMMUNITY): Payer: Medicare PPO

## 2020-05-06 ENCOUNTER — Encounter (HOSPITAL_COMMUNITY): Payer: Self-pay

## 2020-05-06 ENCOUNTER — Inpatient Hospital Stay (HOSPITAL_COMMUNITY)
Admission: EM | Admit: 2020-05-06 | Discharge: 2020-05-09 | DRG: 177 | Disposition: A | Discharge status: Home / Self Care | Payer: Medicare PPO | Attending: Internal Medicine | Admitting: Internal Medicine

## 2020-05-06 DIAGNOSIS — A419 Sepsis, unspecified organism: Secondary | ICD-10-CM

## 2020-05-06 DIAGNOSIS — E785 Hyperlipidemia, unspecified: Secondary | ICD-10-CM | POA: Diagnosis present

## 2020-05-06 DIAGNOSIS — G9341 Metabolic encephalopathy: Secondary | ICD-10-CM | POA: Diagnosis present

## 2020-05-06 DIAGNOSIS — Z833 Family history of diabetes mellitus: Secondary | ICD-10-CM

## 2020-05-06 DIAGNOSIS — I10 Essential (primary) hypertension: Secondary | ICD-10-CM | POA: Diagnosis present

## 2020-05-06 DIAGNOSIS — E1165 Type 2 diabetes mellitus with hyperglycemia: Secondary | ICD-10-CM | POA: Diagnosis present

## 2020-05-06 DIAGNOSIS — Z79899 Other long term (current) drug therapy: Secondary | ICD-10-CM

## 2020-05-06 DIAGNOSIS — Z8249 Family history of ischemic heart disease and other diseases of the circulatory system: Secondary | ICD-10-CM

## 2020-05-06 DIAGNOSIS — J302 Other seasonal allergic rhinitis: Secondary | ICD-10-CM | POA: Diagnosis present

## 2020-05-06 DIAGNOSIS — M170 Bilateral primary osteoarthritis of knee: Secondary | ICD-10-CM | POA: Diagnosis present

## 2020-05-06 DIAGNOSIS — E876 Hypokalemia: Secondary | ICD-10-CM | POA: Diagnosis present

## 2020-05-06 DIAGNOSIS — J9600 Acute respiratory failure, unspecified whether with hypoxia or hypercapnia: Secondary | ICD-10-CM | POA: Diagnosis present

## 2020-05-06 DIAGNOSIS — R4182 Altered mental status, unspecified: Secondary | ICD-10-CM

## 2020-05-06 DIAGNOSIS — K219 Gastro-esophageal reflux disease without esophagitis: Secondary | ICD-10-CM | POA: Diagnosis present

## 2020-05-06 DIAGNOSIS — U071 COVID-19: Principal | ICD-10-CM | POA: Diagnosis present

## 2020-05-06 DIAGNOSIS — Z7952 Long term (current) use of systemic steroids: Secondary | ICD-10-CM

## 2020-05-06 DIAGNOSIS — E559 Vitamin D deficiency, unspecified: Secondary | ICD-10-CM | POA: Diagnosis present

## 2020-05-06 DIAGNOSIS — J9601 Acute respiratory failure with hypoxia: Secondary | ICD-10-CM | POA: Diagnosis present

## 2020-05-06 DIAGNOSIS — A0839 Other viral enteritis: Secondary | ICD-10-CM | POA: Diagnosis present

## 2020-05-06 DIAGNOSIS — E860 Dehydration: Secondary | ICD-10-CM | POA: Diagnosis present

## 2020-05-06 DIAGNOSIS — J1282 Pneumonia due to coronavirus disease 2019: Secondary | ICD-10-CM | POA: Diagnosis present

## 2020-05-06 DIAGNOSIS — E782 Mixed hyperlipidemia: Secondary | ICD-10-CM | POA: Diagnosis present

## 2020-05-06 MED ORDER — LACTATED RINGERS IV SOLN: INTRAVENOUS | Status: DC

## 2020-05-06 MED ORDER — SODIUM CHLORIDE 0.9 % IV BOLUS
1000.0000 mL | Freq: Once | INTRAVENOUS | Status: AC
  Administered 2020-05-07: 1000 mL via INTRAVENOUS

## 2020-05-06 MED ORDER — SODIUM CHLORIDE 0.9 % IV SOLN
2.0000 g | Freq: Once | INTRAVENOUS | Status: AC
  Administered 2020-05-07: 2 g via INTRAVENOUS
  Filled 2020-05-06: qty 20

## 2020-05-06 NOTE — ED Notes (Signed)
Pt has an altered mental status and is unable to answer screening questions.

## 2020-05-06 NOTE — ED Triage Notes (Signed)
EMS reports pt is from home. Family reported pt has been having severe diarrhea and fever for 3 days. Altered mental status on arrival. Temp 102.6 temporal, BP - 124/60, HR - 110, O2 sat 80's on RA. Gave 750ML Bolus of NS.

## 2020-05-06 NOTE — ED Provider Notes (Signed)
[facility] Emergency Department Provider Note MRN:  [id number]  Arrival date & time: 05/06/20     Chief Complaint   Altered Mental Status   History of Present Illness   Jennifer Wu is a 80 y.o. year-old female with a history of diabetes presenting to the ED with chief complaint of altered mental status.  Altered mental status coming from home, several days of diarrhea, weakness. Febrile 102 with EMS. Denies any pain.  I was unable to obtain an accurate HPI, PMH, or ROS due to the patient's altered mental status.  Level 5 caveat.  Review of Systems  Positive for diarrhea, altered mental status, fever.  Patient's Health History    Past Medical History:  Diagnosis Date  . Arthritis    knees - otc med prn  . Cancer ([place]) 2000-rt lumpectomy-no bp rt arm  . Closed fracture of left proximal humerus   . Diabetes mellitus type 2 with complications ([place])   . GERD (gastroesophageal reflux disease)   . High cholesterol   . Mixed dyslipidemia   . 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 Seasonal allergies   . Vitamin D deficiency     Past Surgical History:  Procedure Laterality Date  . BACK SURGERY    . BREAST SURGERY    . COLONOSCOPY    . [place]   MAB  . 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 ELBOW SURGERY    . FRACTURE SURGERY  rt foot-1990  . HYSTEROSCOPY WITH D & C  06/09/2011   Procedure: DILATATION AND CURETTAGE /HYSTEROSCOPY;  Surgeon: Cheri Fowler, MD;  Location: [facility];  Service: Gynecology;  Laterality: N/A;  . KNEE SURGERY  10/12   right knee  . LUMBAR LAMINECTOMY  1991  . LYMPH NODE BIOPSY     right breast  . RADIAL HEAD IMPLANT  02/01/2011   Procedure: RADIAL HEAD IMPLANT;  Surgeon: Schuyler Amor, MD;  Location: [facility];  Service: Orthopedics;  Laterality:  Left;  left radial head replacement  . SVD     x 3    Family History  Problem Relation Age of Onset  . Heart disease Mother        MI's  . CAD Mother   . Diabetes Mother   . Alzheimer's disease Father   . Alcohol abuse Father        cirrhosis  . CAD Brother   . Diabetes Brother   . Heart disease Brother        MI's    Social History   Socioeconomic History  . Marital status: Married    Spouse name: Not on file  . Number of children: Not on file  . Years of education: Not on file  . Highest education level: Not on file  Occupational History  . Not on file  Tobacco Use  . Smoking status: Never Smoker  . Smokeless tobacco: Never Used  Substance and Sexual Activity  . Alcohol use: No  . Drug use: No  . Sexual activity: Not on file  Other Topics Concern  . Not on file  Social History Narrative  . Not on file   Social Determinants of Health   Financial Resource Strain: Not on file  Food Insecurity: Not on file  Transportation Needs: Not on file  Physical  Activity: Not on file  Stress: Not on file  Social Connections: Not on file  Intimate Partner Violence: Not on file     Physical Exam   Vitals:   05/06/20 2335  BP: (!) 145/67  Pulse: (!) 108  Resp: 17  Temp: (!) 100.6 F (38.1 C)  SpO2: 96%    CONSTITUTIONAL: Well-appearing, NAD NEURO:  Alert and oriented x 3, no focal deficits EYES:  eyes equal and reactive ENT/NECK:  no LAD, no JVD CARDIO: Tachycardic rate, well-perfused, normal S1 and S2 PULM: Mildly tachypneic, on nasal cannula GI/GU:  normal bowel sounds, non-distended, non-tender MSK/SPINE:  No gross deformities, no edema SKIN:  no rash, atraumatic PSYCH:  Appropriate speech and behavior  *Additional and/or pertinent findings included in MDM below  Diagnostic and Interventional Summary    EKG Interpretation  Date/Time:  Thursday May 06 2020 23:34:18 EST Ventricular Rate:  108 PR Interval:    QRS Duration: 103 QT  Interval:  323 QTC Calculation: 433 R Axis:   32 Text Interpretation: Sinus tachycardia Ventricular trigeminy Low voltage, precordial leads Nonspecific T abnormalities, inferior leads Confirmed by Gerlene Fee [phone]) on 05/06/2020 11:35:41 PM      Labs Reviewed  RESP PANEL BY RT-PCR (FLU A&B, COVID) [id number]  CULTURE, BLOOD (ROUTINE X 2)  CULTURE, BLOOD (ROUTINE X 2)  URINE CULTURE  LACTIC ACID, PLASMA  LACTIC ACID, PLASMA  COMPREHENSIVE METABOLIC PANEL  CBC WITH DIFFERENTIAL/PLATELET  PROTIME-INR  APTT  URINALYSIS, ROUTINE W REFLEX MICROSCOPIC    DG Chest Port 1 View    (Results Pending)    Medications  lactated ringers infusion (has no administration in time range)  sodium chloride 0.9 % bolus 1,000 mL (has no administration in time range)  cefTRIAXone (ROCEPHIN) 2 g in sodium chloride 0.9 % 100 mL IVPB (has no administration in time range)     Procedures  /  Critical Care .Critical Care Performed by: Maudie Flakes, MD Authorized by: Maudie Flakes, MD   Critical care provider statement:    Critical care time (minutes):  45   Critical care was necessary to treat or prevent imminent or life-threatening deterioration of the following conditions: COVID-19 with hypoxic respiratory failure.   Critical care was time spent personally by me on the following activities:  Discussions with consultants, evaluation of patient's response to treatment, examination of patient, ordering and performing treatments and interventions, ordering and review of laboratory studies, ordering and review of radiographic studies, pulse oximetry, re-evaluation of patient's condition, obtaining history from patient or surrogate and review of old charts    ED Course and Medical Decision Making  I have reviewed the triage vital signs, the nursing notes, and pertinent available records from the EMR.  Listed above are laboratory and imaging tests that I personally ordered, reviewed, and interpreted and  then considered in my medical decision making (see below for details).  Concern for sepsis, unclear source. Patient has recent diarrhea, patient also has new oxygen requirement. Desaturation down to the low 80s on room air, currently on 5 L. And so considering pneumonia, COVID-19. Labs, chest x-ray, urinalysis pending. Providing empiric fluids and antibiotics. Code sepsis initiated.     Work-up overall without signs of bacterial infection.  Significant hypokalemia, repleting.  Patient has tested positive for Covid.  Provided with Decadron, admitted to medicine.  Barth Kirks. Sedonia Small, [place] [email or web address]  Final Clinical Impressions(s) / ED Diagnoses     ICD-10-CM  1. Sepsis, due to unspecified organism, unspecified whether acute organ dysfunction present ([place])  A41.9   2. Altered mental status, unspecified altered mental status type  R41.82     ED Discharge Orders    None       Discharge Instructions Discussed with and Provided to Patient:   Discharge Instructions   None       Maudie Flakes, MD 05/07/20 [phone]

## 2020-05-07 ENCOUNTER — Encounter (HOSPITAL_COMMUNITY): Payer: Self-pay | Admitting: Internal Medicine

## 2020-05-07 ENCOUNTER — Other Ambulatory Visit: Payer: Self-pay

## 2020-05-07 DIAGNOSIS — J302 Other seasonal allergic rhinitis: Secondary | ICD-10-CM | POA: Diagnosis present

## 2020-05-07 DIAGNOSIS — J9600 Acute respiratory failure, unspecified whether with hypoxia or hypercapnia: Secondary | ICD-10-CM | POA: Diagnosis not present

## 2020-05-07 DIAGNOSIS — U071 COVID-19: Principal | ICD-10-CM

## 2020-05-07 DIAGNOSIS — R4182 Altered mental status, unspecified: Secondary | ICD-10-CM

## 2020-05-07 DIAGNOSIS — Z833 Family history of diabetes mellitus: Secondary | ICD-10-CM | POA: Diagnosis not present

## 2020-05-07 DIAGNOSIS — E785 Hyperlipidemia, unspecified: Secondary | ICD-10-CM | POA: Diagnosis not present

## 2020-05-07 DIAGNOSIS — M170 Bilateral primary osteoarthritis of knee: Secondary | ICD-10-CM | POA: Diagnosis present

## 2020-05-07 DIAGNOSIS — I10 Essential (primary) hypertension: Secondary | ICD-10-CM | POA: Diagnosis present

## 2020-05-07 DIAGNOSIS — J9601 Acute respiratory failure with hypoxia: Secondary | ICD-10-CM | POA: Diagnosis present

## 2020-05-07 DIAGNOSIS — R7989 Other specified abnormal findings of blood chemistry: Secondary | ICD-10-CM | POA: Diagnosis not present

## 2020-05-07 DIAGNOSIS — G9341 Metabolic encephalopathy: Secondary | ICD-10-CM | POA: Diagnosis present

## 2020-05-07 DIAGNOSIS — E782 Mixed hyperlipidemia: Secondary | ICD-10-CM | POA: Diagnosis present

## 2020-05-07 DIAGNOSIS — K219 Gastro-esophageal reflux disease without esophagitis: Secondary | ICD-10-CM | POA: Diagnosis present

## 2020-05-07 DIAGNOSIS — E860 Dehydration: Secondary | ICD-10-CM | POA: Diagnosis present

## 2020-05-07 DIAGNOSIS — E876 Hypokalemia: Secondary | ICD-10-CM | POA: Diagnosis present

## 2020-05-07 DIAGNOSIS — A0839 Other viral enteritis: Secondary | ICD-10-CM | POA: Diagnosis present

## 2020-05-07 DIAGNOSIS — Z79899 Other long term (current) drug therapy: Secondary | ICD-10-CM | POA: Diagnosis not present

## 2020-05-07 DIAGNOSIS — E559 Vitamin D deficiency, unspecified: Secondary | ICD-10-CM | POA: Diagnosis present

## 2020-05-07 DIAGNOSIS — Z8249 Family history of ischemic heart disease and other diseases of the circulatory system: Secondary | ICD-10-CM | POA: Diagnosis not present

## 2020-05-07 DIAGNOSIS — Z7952 Long term (current) use of systemic steroids: Secondary | ICD-10-CM | POA: Diagnosis not present

## 2020-05-07 DIAGNOSIS — E1165 Type 2 diabetes mellitus with hyperglycemia: Secondary | ICD-10-CM | POA: Diagnosis present

## 2020-05-07 DIAGNOSIS — J1282 Pneumonia due to coronavirus disease 2019: Secondary | ICD-10-CM | POA: Diagnosis present

## 2020-05-07 LAB — URINALYSIS, ROUTINE W REFLEX MICROSCOPIC
Bacteria, UA: NONE SEEN
Bilirubin Urine: NEGATIVE
Glucose, UA: NEGATIVE mg/dL
Ketones, ur: 5 mg/dL — AB
Leukocytes,Ua: NEGATIVE
Nitrite: NEGATIVE
Protein, ur: 100 mg/dL — AB
Specific Gravity, Urine: 1.02 (ref 1.005–1.030)
pH: 5 (ref 5.0–8.0)

## 2020-05-07 LAB — GLUCOSE, CAPILLARY
Glucose-Capillary: 208 mg/dL — ABNORMAL HIGH (ref 70–99)
Glucose-Capillary: 243 mg/dL — ABNORMAL HIGH (ref 70–99)
Glucose-Capillary: 214 mg/dL — ABNORMAL HIGH (ref 70–99)

## 2020-05-07 LAB — COMPREHENSIVE METABOLIC PANEL
ALT: 12 U/L (ref 0–44)
AST: 16 U/L (ref 15–41)
Albumin: 3 g/dL — ABNORMAL LOW (ref 3.5–5.0)
Alkaline Phosphatase: 64 U/L (ref 38–126)
Anion gap: 13 (ref 5–15)
BUN: 12 mg/dL (ref 8–23)
CO2: 26 mmol/L (ref 22–32)
Calcium: 8.2 mg/dL — ABNORMAL LOW (ref 8.9–10.3)
Chloride: 95 mmol/L — ABNORMAL LOW (ref 98–111)
Creatinine, Ser: 0.8 mg/dL (ref 0.44–1.00)
GFR, Estimated: >60 mL/min (ref >60)
Glucose, Bld: 184 mg/dL — ABNORMAL HIGH (ref 70–99)
Potassium: 2.3 mmol/L — CL (ref 3.5–5.1)
Sodium: 134 mmol/L — ABNORMAL LOW (ref 135–145)
Total Bilirubin: 1.2 mg/dL (ref 0.3–1.2)
Total Protein: 6.2 g/dL — ABNORMAL LOW (ref 6.5–8.1)

## 2020-05-07 LAB — RESP PANEL BY RT-PCR (FLU A&B, COVID) ARPGX2
Influenza A by PCR: NEGATIVE
Influenza B by PCR: NEGATIVE
SARS Coronavirus 2 by RT PCR: POSITIVE — AB

## 2020-05-07 LAB — CBC WITH DIFFERENTIAL/PLATELET
Abs Immature Granulocytes: 0.05 10*3/uL (ref 0.00–0.07)
Basophils Absolute: 0 10*3/uL (ref 0.0–0.1)
Basophils Relative: 1 %
Eosinophils Absolute: 0 10*3/uL (ref 0.0–0.5)
Eosinophils Relative: 1 %
HCT: 38.7 % (ref 36.0–46.0)
Hemoglobin: 12.1 g/dL (ref 12.0–15.0)
Immature Granulocytes: 1 %
Lymphocytes Relative: 19 %
Lymphs Abs: 0.9 10*3/uL (ref 0.7–4.0)
MCH: 26.9 pg (ref 26.0–34.0)
MCHC: 31.3 g/dL (ref 30.0–36.0)
MCV: 86.2 fL (ref 80.0–100.0)
Monocytes Absolute: 0.9 10*3/uL (ref 0.1–1.0)
Monocytes Relative: 19 %
Neutro Abs: 2.9 10*3/uL (ref 1.7–7.7)
Neutrophils Relative %: 59 %
Platelets: 163 10*3/uL (ref 150–400)
RBC: 4.49 MIL/uL (ref 3.87–5.11)
RDW: 13.4 % (ref 11.5–15.5)
WBC: 4.9 10*3/uL (ref 4.0–10.5)
nRBC: 0 % (ref 0.0–0.2)

## 2020-05-07 LAB — CBC
HCT: 36.1 % (ref 36.0–46.0)
Hemoglobin: 11.4 g/dL — ABNORMAL LOW (ref 12.0–15.0)
MCH: 27 pg (ref 26.0–34.0)
MCHC: 31.6 g/dL (ref 30.0–36.0)
MCV: 85.5 fL (ref 80.0–100.0)
Platelets: 210 10*3/uL (ref 150–400)
RBC: 4.22 MIL/uL (ref 3.87–5.11)
RDW: 13.6 % (ref 11.5–15.5)
WBC: 5.2 10*3/uL (ref 4.0–10.5)
nRBC: 0 % (ref 0.0–0.2)

## 2020-05-07 LAB — CREATININE, SERUM
Creatinine, Ser: 0.75 mg/dL (ref 0.44–1.00)
GFR, Estimated: >60 mL/min (ref >60)

## 2020-05-07 LAB — HEMOGLOBIN A1C
Hgb A1c MFr Bld: 6.8 % — ABNORMAL HIGH (ref 4.8–5.6)
Mean Plasma Glucose: 148.46 mg/dL

## 2020-05-07 LAB — BASIC METABOLIC PANEL
Anion gap: 10 (ref 5–15)
BUN: 10 mg/dL (ref 8–23)
CO2: 24 mmol/L (ref 22–32)
Calcium: 7.9 mg/dL — ABNORMAL LOW (ref 8.9–10.3)
Chloride: 100 mmol/L (ref 98–111)
Creatinine, Ser: 0.69 mg/dL (ref 0.44–1.00)
GFR, Estimated: >60 mL/min (ref >60)
Glucose, Bld: 250 mg/dL — ABNORMAL HIGH (ref 70–99)
Potassium: 3 mmol/L — ABNORMAL LOW (ref 3.5–5.1)
Sodium: 134 mmol/L — ABNORMAL LOW (ref 135–145)

## 2020-05-07 LAB — PROTIME-INR
INR: 1.2 (ref 0.8–1.2)
Prothrombin Time: 14.5 seconds (ref 11.4–15.2)

## 2020-05-07 LAB — MAGNESIUM
Magnesium: 1.8 mg/dL (ref 1.7–2.4)
Magnesium: 1.6 mg/dL — ABNORMAL LOW (ref 1.7–2.4)

## 2020-05-07 LAB — TROPONIN I (HIGH SENSITIVITY)
Troponin I (High Sensitivity): 101 ng/L (ref <18)
Troponin I (High Sensitivity): 66 ng/L — ABNORMAL HIGH (ref <18)

## 2020-05-07 LAB — PROCALCITONIN: Procalcitonin: 1.11 ng/mL

## 2020-05-07 LAB — APTT: aPTT: 32 seconds (ref 24–36)

## 2020-05-07 LAB — LACTIC ACID, PLASMA: Lactic Acid, Venous: 0.9 mmol/L (ref 0.5–1.9)

## 2020-05-07 MED ORDER — INSULIN ASPART 100 UNIT/ML ~~LOC~~ SOLN
0.0000 [IU] | Freq: Three times a day (TID) | SUBCUTANEOUS | Status: DC
  Administered 2020-05-07: 3 [IU] via SUBCUTANEOUS
  Administered 2020-05-08 (×2): 5 [IU] via SUBCUTANEOUS
  Administered 2020-05-08: 7 [IU] via SUBCUTANEOUS
  Administered 2020-05-09: 3 [IU] via SUBCUTANEOUS

## 2020-05-07 MED ORDER — SODIUM CHLORIDE 0.9 % IV SOLN
200.0000 mg | Freq: Once | INTRAVENOUS | Status: AC
  Administered 2020-05-07: 200 mg via INTRAVENOUS
  Filled 2020-05-07: qty 40

## 2020-05-07 MED ORDER — ASCORBIC ACID 500 MG PO TABS
500.0000 mg | ORAL_TABLET | Freq: Every day | ORAL | Status: DC
  Administered 2020-05-07 – 2020-05-09 (×3): 500 mg via ORAL
  Filled 2020-05-07 (×3): qty 1

## 2020-05-07 MED ORDER — METHYLPREDNISOLONE SODIUM SUCC 125 MG IJ SOLR
45.0000 mg | Freq: Two times a day (BID) | INTRAMUSCULAR | Status: DC
  Administered 2020-05-07 – 2020-05-08 (×4): 45 mg via INTRAVENOUS
  Filled 2020-05-07 (×4): qty 2

## 2020-05-07 MED ORDER — ACETAMINOPHEN 325 MG PO TABS
650.0000 mg | ORAL_TABLET | Freq: Four times a day (QID) | ORAL | Status: DC | PRN
  Administered 2020-05-08: 650 mg via ORAL
  Filled 2020-05-07: qty 2

## 2020-05-07 MED ORDER — MAGNESIUM SULFATE 2 GM/50ML IV SOLN
2.0000 g | Freq: Once | INTRAVENOUS | Status: AC
  Administered 2020-05-07: 2 g via INTRAVENOUS
  Filled 2020-05-07: qty 50

## 2020-05-07 MED ORDER — ACETAMINOPHEN 500 MG PO TABS
1000.0000 mg | ORAL_TABLET | Freq: Once | ORAL | Status: AC
  Administered 2020-05-07: 1000 mg via ORAL
  Filled 2020-05-07: qty 2

## 2020-05-07 MED ORDER — SODIUM CHLORIDE 0.9 % IV SOLN
100.0000 mg | Freq: Every day | INTRAVENOUS | Status: DC
  Administered 2020-05-08: 100 mg via INTRAVENOUS
  Filled 2020-05-07 (×2): qty 20

## 2020-05-07 MED ORDER — POTASSIUM CHLORIDE 10 MEQ/100ML IV SOLN
10.0000 meq | INTRAVENOUS | Status: AC
  Administered 2020-05-07 (×3): 10 meq via INTRAVENOUS
  Filled 2020-05-07 (×3): qty 100

## 2020-05-07 MED ORDER — PREDNISONE 5 MG PO TABS: 50.0000 mg | ORAL_TABLET | Freq: Every day | ORAL | Status: DC

## 2020-05-07 MED ORDER — LOPERAMIDE HCL 2 MG PO CAPS
2.0000 mg | ORAL_CAPSULE | Freq: Once | ORAL | Status: AC
  Administered 2020-05-07: 2 mg via ORAL
  Filled 2020-05-07: qty 1

## 2020-05-07 MED ORDER — ZINC SULFATE 220 (50 ZN) MG PO CAPS
220.0000 mg | ORAL_CAPSULE | Freq: Every day | ORAL | Status: DC
  Administered 2020-05-07 – 2020-05-09 (×3): 220 mg via ORAL
  Filled 2020-05-07 (×3): qty 1

## 2020-05-07 MED ORDER — POTASSIUM CHLORIDE CRYS ER 20 MEQ PO TBCR
40.0000 meq | EXTENDED_RELEASE_TABLET | Freq: Once | ORAL | Status: AC
  Administered 2020-05-07: 40 meq via ORAL
  Filled 2020-05-07: qty 2

## 2020-05-07 MED ORDER — ACETAMINOPHEN 650 MG RE SUPP: 650.0000 mg | Freq: Four times a day (QID) | RECTAL | Status: DC | PRN

## 2020-05-07 MED ORDER — ENOXAPARIN SODIUM 40 MG/0.4ML ~~LOC~~ SOLN
40.0000 mg | Freq: Every day | SUBCUTANEOUS | Status: DC
  Administered 2020-05-07 – 2020-05-09 (×3): 40 mg via SUBCUTANEOUS
  Filled 2020-05-07 (×3): qty 0.4

## 2020-05-07 MED ORDER — AMLODIPINE BESYLATE 5 MG PO TABS
5.0000 mg | ORAL_TABLET | Freq: Every day | ORAL | Status: DC
  Administered 2020-05-07 – 2020-05-08 (×2): 5 mg via ORAL
  Filled 2020-05-07 (×2): qty 1

## 2020-05-07 MED ORDER — LOPERAMIDE HCL 2 MG PO CAPS
4.0000 mg | ORAL_CAPSULE | Freq: Once | ORAL | Status: AC
  Administered 2020-05-07: 4 mg via ORAL
  Filled 2020-05-07: qty 2

## 2020-05-07 MED ORDER — DEXAMETHASONE SODIUM PHOSPHATE 10 MG/ML IJ SOLN
8.0000 mg | Freq: Once | INTRAMUSCULAR | Status: AC
  Administered 2020-05-07: 8 mg via INTRAVENOUS
  Filled 2020-05-07: qty 1

## 2020-05-07 MED ORDER — ATORVASTATIN CALCIUM 10 MG PO TABS
20.0000 mg | ORAL_TABLET | Freq: Every day | ORAL | Status: DC
Start: 2020-05-07 — End: 2020-05-09
  Administered 2020-05-07 – 2020-05-09 (×3): 20 mg via ORAL
  Filled 2020-05-07 (×3): qty 2

## 2020-05-07 MED ORDER — LACTATED RINGERS IV SOLN: INTRAVENOUS | Status: AC

## 2020-05-07 NOTE — Progress Notes (Signed)
PROGRESS NOTE                                                                                                                                                                                                             Patient Demographics:    Jennifer Wu, is a 80 y.o. female, DOB - [birth year], OBS:[id number]  Outpatient Primary MD for the patient is [name], MD   Admit date - 05/06/2020   LOS - 0  Chief Complaint  Patient presents with  . Altered Mental Status       Brief Narrative: Patient is a 80 y.o. female with PMHx of HLD, HTN, DM-2-presented with 4 to 5-day history of fever, diarrhea-upon further evaluation-she was found to have acute gastroenteritis and mild hypoxemia due to COVID-19 infection.  See below for further details.  COVID-19 vaccinated status: Vaccinated-second Pfizer vaccine on October 2021  Significant Events: 2/10>> Admit to [facility] for COVID-19 related gastroenteritis and hypoxemia  Significant studies: 2/11>>Chest x-ray: Cardiomegaly/vascular congestion-right hilar fullness  COVID-19 medications: Steroids: 2/10>> Remdesivir: 2/10>>  Antibiotics: None  Microbiology data: 2/11 >>blood culture: Pending 2/11>> urine culture: Pending  Procedures: None  Consults: None  DVT prophylaxis: enoxaparin (LOVENOX) injection 40 mg Start: 05/07/20 1000   Subjective:    Jennifer Wu today claims that she still has some diarrhea-stable on just 2 L of oxygen.   Assessment  & Plan :   COVID-19 related gastroenteritis-with pneumonia with acute hypoxic respiratory failure: Mild hypoxemia continues-titrated down to 2 L this morning-continues to have some diarrhea-continue steroid/Remdesivir.  Add as needed Imodium for diarrhea.   Fever: afebrile O2 requirements:  SpO2: 97 % O2 Flow Rate (L/min): 2 L/min   COVID-19 Labs: No results for input(s): DDIMER, FERRITIN, LDH, CRP in the last  72 hours.  No results found for: BNP  Recent Labs  Lab 05/07/20 0609  PROCALCITON 1.11    Lab Results  Component Value Date   SARSCOV2NAA POSITIVE (A) 05/07/2020     Prone/Incentive Spirometry: encouraged incentive spirometry use 3-4/hour.  Acute metabolic encephalopathy: Due to hypoxia/COVID-19 infection-resolved-she is completely awake and alert  Hypokalemia: Being repleted and recheck this afternoon  HTN: Stable-continue amlodipine  HLD: Continue statin  DM-2 (A1c 6.8 on 2/11): Place on SSI and follow closely.  Deconditioning/debility: Obtain PT/OT eval.   ABG:    Component  Value Date/Time   TCO2 27 03/30/2017 1504    Vent Settings: N/A    Condition - Stable  Family Communication  :  Spouse at bedside  Code Status :  Full Code  Diet :  Diet Order            Diet Heart Room service appropriate? Yes; Fluid consistency: Thin  Diet effective now                  Disposition Plan  :   Status is: Inpatient  Remains inpatient appropriate because:Inpatient level of care appropriate due to severity of illness   Dispo: The patient is from: Home              Anticipated d/c is to: Home              Anticipated d/c date is: 2 days              Patient currently is not medically stable to d/c.   Difficult to place patient No   Barriers to discharge: Hypoxia requiring O2 supplementation/complete 5 days of IV Remdesivir  Antimicorbials  :    Anti-infectives (From admission, onward)   Start     Dose/Rate Route Frequency Ordered Stop   05/08/20 1000  remdesivir 100 mg in sodium chloride 0.9 % 100 mL IVPB       "Followed by" Linked Group Details   100 mg 200 mL/hr over 30 Minutes Intravenous Daily 05/07/20 0428 05/12/20 0959   05/07/20 0530  remdesivir 200 mg in sodium chloride 0.9% 250 mL IVPB       "Followed by" Linked Group Details   200 mg 580 mL/hr over 30 Minutes Intravenous Once 05/07/20 0428 05/07/20 0656   05/07/20 0000  cefTRIAXone  (ROCEPHIN) 2 g in sodium chloride 0.9 % 100 mL IVPB        2 g 200 mL/hr over 30 Minutes Intravenous  Once 05/06/20 2349 05/07/20 0135      Inpatient Medications  Scheduled Meds: . amLODipine  5 mg Oral Daily  . vitamin C  500 mg Oral Daily  . atorvastatin  20 mg Oral Daily  . enoxaparin (LOVENOX) injection  40 mg Subcutaneous Daily  . methylPREDNISolone (SOLU-MEDROL) injection  45 mg Intravenous BID   Followed by  . [START ON 05/10/2020] predniSONE  50 mg Oral Daily  . zinc sulfate  220 mg Oral Daily   Continuous Infusions: . lactated ringers Stopped (05/07/20 0622)  . [START ON 05/08/2020] remdesivir 100 mg in NS 100 mL     PRN Meds:.acetaminophen **OR** acetaminophen   Time Spent in minutes  25  See all Orders from today for further details   Oren Binet M.D on 05/07/2020 at 11:30 AM  To page go to [email or web address] - use universal password  [facility] -  Office  [phone]    Objective:   Vitals:   05/07/20 0700 05/07/20 0730 05/07/20 0845 05/07/20 0942  BP: (!) 117/57 119/65 115/62 124/66  Pulse: 82 84 81 86  Resp: (!) 22 10 19 17   Temp:    97.8 F (36.6 C)  TempSrc:    Oral  SpO2: 91% 92% 94% 97%  Weight:    96.3 kg  Height:    5\' 5"  (1.651 m)    Wt Readings from Last 3 Encounters:  05/07/20 96.3 kg  11/24/19 93.9 kg  08/18/19 91.2 kg    No intake or output data in the 24 hours ending 05/07/20  1130   Physical Exam Gen Exam:Alert awake-not in any distress HEENT:atraumatic, normocephalic Chest: B/L clear to auscultation anteriorly CVS:S1S2 regular Abdomen:soft non tender, non distended Extremities:no edema Neurology: Non focal Skin: no rash   Data Review:    CBC Recent Labs  Lab 05/07/20 0025 05/07/20 0609  WBC 4.9 5.2  HGB 12.1 11.4*  HCT 38.7 36.1  PLT 163 210  MCV 86.2 85.5  MCH 26.9 27.0  MCHC 31.3 31.6  RDW 13.4 13.6  LYMPHSABS 0.9  --   MONOABS 0.9  --   EOSABS 0.0  --   BASOSABS 0.0  --     Chemistries   Recent Labs  Lab 05/07/20 0025 05/07/20 0609  NA 134*  --   K 2.3*  --   CL 95*  --   CO2 26  --   GLUCOSE 184*  --   BUN 12  --   CREATININE 0.80 0.75  CALCIUM 8.2*  --   MG  --  1.8  AST 16  --   ALT 12  --   ALKPHOS 64  --   BILITOT 1.2  --    ------------------------------------------------------------------------------------------------------------------ No results for input(s): CHOL, HDL, LDLCALC, TRIG, CHOLHDL, LDLDIRECT in the last 72 hours.  Lab Results  Component Value Date   HGBA1C 6.8 (H) 05/07/2020   ------------------------------------------------------------------------------------------------------------------ No results for input(s): TSH, T4TOTAL, T3FREE, THYROIDAB in the last 72 hours.  Invalid input(s): FREET3 ------------------------------------------------------------------------------------------------------------------ No results for input(s): VITAMINB12, FOLATE, FERRITIN, TIBC, IRON, RETICCTPCT in the last 72 hours.  Coagulation profile Recent Labs  Lab 05/07/20 0025  INR 1.2    No results for input(s): DDIMER in the last 72 hours.  Cardiac Enzymes No results for input(s): CKMB, TROPONINI, MYOGLOBIN in the last 168 hours.  Invalid input(s): CK ------------------------------------------------------------------------------------------------------------------ No results found for: BNP  Micro Results Recent Results (from the past 240 hour(s))  Resp Panel by RT-PCR (Flu A&B, Covid) Nasopharyngeal Swab     Status: Abnormal   Collection Time: 05/07/20 12:25 AM   Specimen: Nasopharyngeal Swab; Nasopharyngeal(NP) swabs in vial transport medium  Result Value Ref Range Status   SARS Coronavirus 2 by RT PCR POSITIVE (A) NEGATIVE Final    Comment: RESULT CALLED TO, READ BACK BY AND VERIFIED WITH: K ISLEY RN 05/07/20 0259 JDW (NOTE) SARS-CoV-2 target nucleic acids are DETECTED.  The SARS-CoV-2 RNA is generally detectable in upper  respiratory specimens during the acute phase of infection. Positive results are indicative of the presence of the identified virus, but do not rule out bacterial infection or co-infection with other pathogens not detected by the test. Clinical correlation with patient history and other diagnostic information is necessary to determine patient infection status. The expected result is Negative.  Fact Sheet for Patients: EntrepreneurPulse.com.au  Fact Sheet for Healthcare Providers: IncredibleEmployment.be  This test is not yet approved or cleared by the [place] FDA and  has been authorized for detection and/or diagnosis of SARS-CoV-2 by FDA under an Emergency Use Authorization (EUA).  This EUA will remain in effect (meaning this test can be used)  for the duration of  the COVID-19 declaration under Section 564(b)(1) of the Act, 21 U.S.C. section 360bbb-3(b)(1), unless the authorization is terminated or revoked sooner.     Influenza A by PCR NEGATIVE NEGATIVE Final   Influenza B by PCR NEGATIVE NEGATIVE Final    Comment: (NOTE) The Xpert Xpress SARS-CoV-2/FLU/RSV plus assay is intended as an aid in the diagnosis of influenza from Nasopharyngeal swab specimens and  should not be used as a sole basis for treatment. Nasal washings and aspirates are unacceptable for Xpert Xpress SARS-CoV-2/FLU/RSV testing.  Fact Sheet for Patients: EntrepreneurPulse.com.au  Fact Sheet for Healthcare Providers: IncredibleEmployment.be  This test is not yet approved or cleared by the [place] FDA and has been authorized for detection and/or diagnosis of SARS-CoV-2 by FDA under an Emergency Use Authorization (EUA). This EUA will remain in effect (meaning this test can be used) for the duration of the COVID-19 declaration under Section 564(b)(1) of the Act, 21 U.S.C. section 360bbb-3(b)(1), unless the authorization is  terminated or revoked.  Performed at [facility], [place] [address] [place], [place] [address]     Radiology Reports DG Chest [place] 1 View  Result Date: 05/07/2020 CLINICAL DATA:  Questionable sepsis EXAM: PORTABLE CHEST 1 VIEW COMPARISON:  05/29/2017 FINDINGS: Cardiomegaly, vascular congestion. Prominent right hilum could be vascular or adenopathy. Interstitial prominence throughout the lungs, likely early interstitial edema. No effusions. No acute bony abnormality. IMPRESSION: Cardiomegaly with vascular congestion and possible early interstitial edema. Right hilar fullness could reflect vascular prominence or adenopathy. Electronically Signed   By: Rolm Baptise M.D.   On: 05/07/2020 00:10

## 2020-05-07 NOTE — ED Notes (Signed)
Dr. Sedonia Small notified of pt temp of 103.0ral.

## 2020-05-07 NOTE — Sepsis Progress Note (Signed)
Monitoring for code sepsis protocol. 

## 2020-05-07 NOTE — H&P (Signed)
History and Physical    Jennifer Wu:[id number] DOB: April 26, 1940 DOA: 05/06/2020  PCP: [name], MD  Patient coming from: Home.  Chief Complaint: Diarrhea and confusion.  HPI: Jennifer Wu is a 80 y.o. female with history of hypertension hyperlipidemia, diabetes mellitus is mention in the chart but patient has been states she does not have diabetes presents to the ER with having persistent severe diarrhea for the last 2 days with confusion.  Denies any chest pain or shortness of breath.  Has had 2 doses of Pfizer vaccine last year.  ED Course: In the ER patient was febrile with temperature 103 F tachycardic lactic acid was normal Covid test came positive with chest x-ray showing interstitial pattern.  Labs show sodium of 134 potassium 2.3.  Patient was requiring 5 L oxygen to maintain sats more than 90%.  Patient was started on steroids and remdesivir admitted for acute hypoxic respiratory failure secondary Covid infection with diarrhea likely from Covid.  Review of Systems: As per HPI, rest all negative.   Past Medical History:  Diagnosis Date  . Arthritis    knees - otc med prn  . Cancer ([place]) 2000-rt lumpectomy-no bp rt arm  . Closed fracture of left proximal humerus   . Diabetes mellitus type 2 with complications ([place])   . GERD (gastroesophageal reflux disease)   . High cholesterol   . Mixed dyslipidemia   . 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 Seasonal allergies   . Vitamin D deficiency     Past Surgical History:  Procedure Laterality Date  . BACK SURGERY    . BREAST SURGERY    . COLONOSCOPY    . [place]   MAB  . 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 ELBOW SURGERY    . FRACTURE SURGERY  rt foot-1990  . HYSTEROSCOPY WITH D & C  06/09/2011   Procedure: DILATATION AND CURETTAGE /HYSTEROSCOPY;   Surgeon: Cheri Fowler, MD;  Location: [facility];  Service: Gynecology;  Laterality: N/A;  . KNEE SURGERY  10/12   right knee  . LUMBAR LAMINECTOMY  1991  . LYMPH NODE BIOPSY     right breast  . RADIAL HEAD IMPLANT  02/01/2011   Procedure: RADIAL HEAD IMPLANT;  Surgeon: Schuyler Amor, MD;  Location: [facility];  Service: Orthopedics;  Laterality: Left;  left radial head replacement  . SVD     x 3     reports that she has never smoked. She has never used smokeless tobacco. She reports that she does not drink alcohol and does not use drugs.  Allergies  Allergen Reactions  . Lisinopril Nausea And Vomiting    Family History  Problem Relation Age of Onset  . Heart disease Mother        MI's  . CAD Mother   . Diabetes Mother   . Alzheimer's disease Father   . Alcohol abuse Father        cirrhosis  . CAD Brother   . Diabetes Brother   . Heart disease Brother        MI's    Prior to Admission medications   Medication Sig Start Date End Date Taking? Authorizing Provider  ALPRAZolam Duanne Moron) 0.5 MG tablet Take 0.5 mg by mouth as needed for anxiety.    [provider]  amLODipine (NORVASC) 5 MG tablet TAKE 1 TABLET BY MOUTH DAILY. 02/26/20   [name], MD  atorvastatin (LIPITOR) 20 MG tablet TAKE 1 TABLET BY MOUTH DAILY. 12/22/19   [name], MD  chlorpheniramine (CHLOR-TRIMETON) 4 MG tablet Take 4 mg by mouth every 4 (four) hours as needed for allergies.    [provider]  cyclobenzaprine (FLEXERIL) 5 MG tablet Take 1 tablet (5 mg total) by mouth 3 (three) times daily as needed for muscle spasms. 06/16/19   [name], MD  famotidine (PEPCID) 40 MG tablet Take 1 tablet (40 mg total) by mouth daily. 06/16/19   [name], MD  fluticasone (FLONASE) 50 MCG/ACT nasal spray fluticasone propionate 50 mcg/actuation nasal spray,suspension    [provider]  furosemide (LASIX) 20 MG tablet TAKE 1 TABLET (20 MG TOTAL) BY  MOUTH DAILY. STOP HYDROCHLOROTHIAZIDE 04/01/20   [name], MD  gabapentin (NEURONTIN) 300 MG capsule TAKE 1 CAPSULE BY MOUTH THREE TIMES DAILY. 04/01/20   Tanda Rockers, MD  ibuprofen (ADVIL,MOTRIN) 200 MG tablet Take 400-600 mg by mouth every 6 (six) hours as needed for headache or moderate pain.    [provider]  levalbuterol (XOPENEX) 0.63 MG/3ML nebulizer solution levalbuterol 0.63 mg/3 mL solution for nebulization  USE 1 VIAL IN NEBULIER EVERY 8 HOURS AS NEEDED FOR WHEEZE/SHORTNESS OF BREATH    [provider]  montelukast (SINGULAIR) 10 MG tablet TAKE 1 TABLET BY MOUTH AT BEDTIME. 02/26/20   Tanda Rockers, MD  Nutritional Supplements (VARICOSE VEINS FORMULA PO) Take by mouth daily.    [provider]  pantoprazole (PROTONIX) 40 MG tablet TAKE 1 TABLET BY MOUTH TWO TIMES DAILY 02/26/20   [name], MD  predniSONE (DELTASONE) 10 MG tablet Take  4 each am x 2 days,   2 each am x 2 days,  1 each am x 2 days and stop 11/24/19   Tanda Rockers, MD  Vitamin D, Ergocalciferol, (DRISDOL) 1.25 MG (50000 UNIT) CAPS capsule  11/07/19   [provider]    Physical Exam: Constitutional: Moderately built and nourished. Vitals:   05/07/20 0200 05/07/20 0215 05/07/20 0240 05/07/20 0356  BP: (!) 160/51 (!) 166/48    Pulse: (!) 109 (!) 108    Resp: (!) 32 (!) 30    Temp:   (!) 103 F (39.4 C) (!) 100.9 F (38.3 C)  TempSrc:   Oral Oral  SpO2: 92% 91%    Weight:       Eyes: Anicteric no pallor. ENMT: No discharge from the ears eyes nose or mouth. Neck: No mass felt.  No neck rigidity. Respiratory: No rhonchi or crepitations. Cardiovascular: S1-S2 heard. Abdomen: Soft nontender bowel sounds present. Musculoskeletal: No edema. Skin: No rash. Neurologic: Patient is alert awake oriented to her name appears mildly confused moving all extremities. Psychiatric: Mildly confused.   Labs on Admission: I have personally reviewed following labs and  imaging studies  CBC: Recent Labs  Lab 05/07/20 0025  WBC 4.9  NEUTROABS 2.9  HGB 12.1  HCT 38.7  MCV 86.2  PLT 409   Basic Metabolic Panel: Recent Labs  Lab 05/07/20 0025  NA 134*  K 2.3*  CL 95*  CO2 26  GLUCOSE 184*  BUN 12  CREATININE 0.80  CALCIUM 8.2*   GFR: Estimated Creatinine Clearance: 65.6 mL/min (by C-G formula based on SCr of 0.8 mg/dL). Liver Function Tests: Recent Labs  Lab 05/07/20 0025  AST 16  ALT 12  ALKPHOS 64  BILITOT 1.2  PROT 6.2*  ALBUMIN 3.0*   No results for input(s): LIPASE, AMYLASE in the last 168 hours. No results for input(s): AMMONIA in the last 168 hours. Coagulation Profile: Recent Labs  Lab 05/07/20 0025  INR 1.2   Cardiac Enzymes: No results for input(s): CKTOTAL, CKMB, CKMBINDEX, TROPONINI in the last 168 hours. BNP (last 3 results) No results for input(s): PROBNP in the last 8760 hours. HbA1C: No results for input(s): HGBA1C in the last 72 hours. CBG: No results for input(s): GLUCAP in the last 168 hours. Lipid Profile: No results for input(s): CHOL, HDL, LDLCALC, TRIG, CHOLHDL, LDLDIRECT in the last 72 hours. Thyroid Function Tests: No results for input(s): TSH, T4TOTAL, FREET4, T3FREE, THYROIDAB in the last 72 hours. Anemia Panel: No results for input(s): VITAMINB12, FOLATE, FERRITIN, TIBC, IRON, RETICCTPCT in the last 72 hours. Urine analysis:    Component Value Date/Time   COLORURINE YELLOW 05/07/2020 0043   APPEARANCEUR HAZY (A) 05/07/2020 0043   LABSPEC 1.020 05/07/2020 0043   PHURINE 5.0 05/07/2020 0043   GLUCOSEU NEGATIVE 05/07/2020 0043   HGBUR SMALL (A) 05/07/2020 0043   BILIRUBINUR NEGATIVE 05/07/2020 0043   KETONESUR 5 (A) 05/07/2020 0043   PROTEINUR 100 (A) 05/07/2020 0043   NITRITE NEGATIVE 05/07/2020 0043   LEUKOCYTESUR NEGATIVE 05/07/2020 0043   Sepsis Labs: @LABRCNTIP (procalcitonin:4,lacticidven:4) ) Recent Results (from the past 240 hour(s))  Resp Panel by RT-PCR (Flu A&B, Covid)  Nasopharyngeal Swab     Status: Abnormal   Collection Time: 05/07/20 12:25 AM   Specimen: Nasopharyngeal Swab; Nasopharyngeal(NP) swabs in vial transport medium  Result Value Ref Range Status   SARS Coronavirus 2 by RT PCR POSITIVE (A) NEGATIVE Final    Comment: RESULT CALLED TO, READ BACK BY AND VERIFIED WITH: K ISLEY RN 05/07/20 0259 JDW (NOTE) SARS-CoV-2 target nucleic acids are DETECTED.  The SARS-CoV-2 RNA is generally detectable in upper respiratory specimens during the acute phase of infection. Positive results are indicative of the presence of the identified virus, but do not rule out bacterial infection or co-infection with other pathogens not detected by the test. Clinical correlation with patient history and other diagnostic information is necessary to determine patient infection status. The expected result is Negative.  Fact Sheet for Patients: EntrepreneurPulse.com.au  Fact Sheet for Healthcare Providers: IncredibleEmployment.be  This test is not yet approved or cleared by the [place] FDA and  has been authorized for detection and/or diagnosis of SARS-CoV-2 by FDA under an Emergency Use Authorization (EUA).  This EUA will remain in effect (meaning this test can be used)  for the duration of  the COVID-19 declaration under Section 564(b)(1) of the Act, 21 U.S.C. section 360bbb-3(b)(1), unless the authorization is terminated or revoked sooner.     Influenza A by PCR NEGATIVE NEGATIVE Final   Influenza B by PCR NEGATIVE NEGATIVE Final    Comment: (NOTE) The Xpert Xpress SARS-CoV-2/FLU/RSV plus assay is intended as an aid in the diagnosis of influenza from Nasopharyngeal swab specimens and should not be used as a sole basis for treatment. Nasal washings and aspirates are unacceptable for Xpert Xpress SARS-CoV-2/FLU/RSV testing.  Fact Sheet for Patients: EntrepreneurPulse.com.au  Fact Sheet for Healthcare  Providers: IncredibleEmployment.be  This test is not yet approved or cleared by the [place] FDA and has been authorized for detection and/or diagnosis of SARS-CoV-2 by FDA under an Emergency Use Authorization (EUA). This EUA will remain in effect (meaning this test can  be used) for the duration of the COVID-19 declaration under Section 564(b)(1) of the Act, 21 U.S.C. section 360bbb-3(b)(1), unless the authorization is terminated or revoked.  Performed at [facility], [place] [address], [place], [place] [address]      Radiological Exams on Admission: DG Chest Port 1 View  Result Date: 05/07/2020 CLINICAL DATA:  Questionable sepsis EXAM: PORTABLE CHEST 1 VIEW COMPARISON:  05/29/2017 FINDINGS: Cardiomegaly, vascular congestion. Prominent right hilum could be vascular or adenopathy. Interstitial prominence throughout the lungs, likely early interstitial edema. No effusions. No acute bony abnormality. IMPRESSION: Cardiomegaly with vascular congestion and possible early interstitial edema. Right hilar fullness could reflect vascular prominence or adenopathy. Electronically Signed   By: Rolm Baptise M.D.   On: 05/07/2020 00:10    EKG: Independently reviewed.  Normal sinus rhythm.  Assessment/Plan Principal Problem:   Acute respiratory failure due to COVID-19 [facility]) Active Problems:   Essential hypertension   HLD (hyperlipidemia)    1. Acute respiratory failure with hypoxia presently on 5 L oxygen to maintain sats likely from Covid infection for which patient is started on IV remdesivir and steroids after consent obtained from patient's husband.  Patient's husband also has consented for baricitinib or Actemra if required.  We will closely monitor respiratory status and inflammatory markers. 2. Severe diarrhea likely from Covid infection.  Presently on IV fluids.  Continue to monitor. 3. Hypokalemia likely from diarrhea replace and recheck.  Check magnesium  levels. 4. Hypertension on amlodipine. 5. Hyperlipidemia on statins. 6. Hyperglycemia check hemoglobin A1c.  Patient's chart mentions diabetes mellitus but patient's husband states she does not take any medicines for diabetes. 7. Acute encephalopathy likely from SIRS and Covid infection.  Closely monitor.  Appears nonfocal.  Since patient has acute respiratory failure presently requiring on 5 L oxygen with Covid infection will need close monitoring for any further worsening in inpatient status.   DVT prophylaxis: Lovenox. Code Status: Full code. Family Communication: Patient's husband. Disposition Plan: Home. Consults called: None. Admission status: Inpatient.   Rise Patience MD [facility] Pager [phone].  If 7PM-7AM, please contact night-coverage [email or web address] Password [facility]  05/07/2020, 4:29 AM

## 2020-05-07 NOTE — ED Notes (Signed)
Date and time results received: 05/07/20 0155 (use smartphrase ".now" to insert current time)  Test: Potassium Critical Value: 2.3  Name of Provider Notified: [place]  Orders Received? Or Actions Taken?: Orders Received - See Orders for details

## 2020-05-07 NOTE — Progress Notes (Signed)
Patient admitted to 5W from ED. Patient is alert and oriented x4. Vital signs are stable and she is on 2L of oxygen. Has no complaints of pain. Skin is intact, no signs of skin breakdown noted on exam. Patient belongings at bedside (purple suitcase with clothing, glasses, cell phone). The patient was shown how to use the call bell. Call bell, phone and bedside table are within reach; bed is in the lowest position.

## 2020-05-07 NOTE — Plan of Care (Signed)

## 2020-05-08 DIAGNOSIS — R4182 Altered mental status, unspecified: Secondary | ICD-10-CM | POA: Diagnosis not present

## 2020-05-08 DIAGNOSIS — U071 COVID-19: Secondary | ICD-10-CM | POA: Diagnosis not present

## 2020-05-08 DIAGNOSIS — E785 Hyperlipidemia, unspecified: Secondary | ICD-10-CM | POA: Diagnosis not present

## 2020-05-08 DIAGNOSIS — I10 Essential (primary) hypertension: Secondary | ICD-10-CM | POA: Diagnosis not present

## 2020-05-08 LAB — COMPREHENSIVE METABOLIC PANEL
ALT: 17 U/L (ref 0–44)
AST: 20 U/L (ref 15–41)
Albumin: 2.8 g/dL — ABNORMAL LOW (ref 3.5–5.0)
Alkaline Phosphatase: 59 U/L (ref 38–126)
Anion gap: 11 (ref 5–15)
BUN: 16 mg/dL (ref 8–23)
CO2: 23 mmol/L (ref 22–32)
Calcium: 8.6 mg/dL — ABNORMAL LOW (ref 8.9–10.3)
Chloride: 99 mmol/L (ref 98–111)
Creatinine, Ser: 0.71 mg/dL (ref 0.44–1.00)
GFR, Estimated: >60 mL/min (ref >60)
Glucose, Bld: 223 mg/dL — ABNORMAL HIGH (ref 70–99)
Potassium: 3 mmol/L — ABNORMAL LOW (ref 3.5–5.1)
Sodium: 133 mmol/L — ABNORMAL LOW (ref 135–145)
Total Bilirubin: 0.5 mg/dL (ref 0.3–1.2)
Total Protein: 6.3 g/dL — ABNORMAL LOW (ref 6.5–8.1)

## 2020-05-08 LAB — CBC WITH DIFFERENTIAL/PLATELET
Abs Immature Granulocytes: 0.12 10*3/uL — ABNORMAL HIGH (ref 0.00–0.07)
Basophils Absolute: 0 10*3/uL (ref 0.0–0.1)
Basophils Relative: 0 %
Eosinophils Absolute: 0 10*3/uL (ref 0.0–0.5)
Eosinophils Relative: 0 %
HCT: 34.2 % — ABNORMAL LOW (ref 36.0–46.0)
Hemoglobin: 11.1 g/dL — ABNORMAL LOW (ref 12.0–15.0)
Immature Granulocytes: 2 %
Lymphocytes Relative: 18 %
Lymphs Abs: 1 10*3/uL (ref 0.7–4.0)
MCH: 27.2 pg (ref 26.0–34.0)
MCHC: 32.5 g/dL (ref 30.0–36.0)
MCV: 83.8 fL (ref 80.0–100.0)
Monocytes Absolute: 0.8 10*3/uL (ref 0.1–1.0)
Monocytes Relative: 14 %
Neutro Abs: 3.9 10*3/uL (ref 1.7–7.7)
Neutrophils Relative %: 66 %
Platelets: 241 10*3/uL (ref 150–400)
RBC: 4.08 MIL/uL (ref 3.87–5.11)
RDW: 13.3 % (ref 11.5–15.5)
WBC Morphology: INCREASED
WBC: 5.9 10*3/uL (ref 4.0–10.5)
nRBC: 0 % (ref 0.0–0.2)

## 2020-05-08 LAB — GLUCOSE, CAPILLARY
Glucose-Capillary: 253 mg/dL — ABNORMAL HIGH (ref 70–99)
Glucose-Capillary: 298 mg/dL — ABNORMAL HIGH (ref 70–99)
Glucose-Capillary: 345 mg/dL — ABNORMAL HIGH (ref 70–99)
Glucose-Capillary: 284 mg/dL — ABNORMAL HIGH (ref 70–99)

## 2020-05-08 LAB — C DIFFICILE QUICK SCREEN W PCR REFLEX
C Diff antigen: NEGATIVE
C Diff interpretation: NOT DETECTED
C Diff toxin: NEGATIVE

## 2020-05-08 LAB — D-DIMER, QUANTITATIVE: D-Dimer, Quant: 4.36 ug/mL-FEU — ABNORMAL HIGH (ref 0.00–0.50)

## 2020-05-08 LAB — URINE CULTURE: Culture: NO GROWTH

## 2020-05-08 LAB — C-REACTIVE PROTEIN: CRP: 25.4 mg/dL — ABNORMAL HIGH (ref <1.0)

## 2020-05-08 MED ORDER — PANTOPRAZOLE SODIUM 40 MG PO TBEC
40.0000 mg | DELAYED_RELEASE_TABLET | Freq: Two times a day (BID) | ORAL | Status: DC
  Administered 2020-05-08 – 2020-05-09 (×3): 40 mg via ORAL
  Filled 2020-05-08 (×3): qty 1

## 2020-05-08 MED ORDER — POTASSIUM CHLORIDE CRYS ER 20 MEQ PO TBCR
40.0000 meq | EXTENDED_RELEASE_TABLET | Freq: Four times a day (QID) | ORAL | Status: AC
  Administered 2020-05-08 (×2): 40 meq via ORAL
  Filled 2020-05-08 (×2): qty 2

## 2020-05-08 MED ORDER — POTASSIUM CHLORIDE CRYS ER 20 MEQ PO TBCR
40.0000 meq | EXTENDED_RELEASE_TABLET | Freq: Once | ORAL | Status: AC
  Administered 2020-05-08: 40 meq via ORAL
  Filled 2020-05-08: qty 2

## 2020-05-08 MED ORDER — MAGNESIUM SULFATE 4 GM/100ML IV SOLN
4.0000 g | Freq: Once | INTRAVENOUS | Status: AC
  Administered 2020-05-08: 4 g via INTRAVENOUS
  Filled 2020-05-08: qty 100

## 2020-05-08 MED ORDER — FAMOTIDINE 20 MG PO TABS
40.0000 mg | ORAL_TABLET | Freq: Every day | ORAL | Status: DC
Start: 2020-05-08 — End: 2020-05-09
  Administered 2020-05-08 (×2): 40 mg via ORAL
  Filled 2020-05-08 (×2): qty 2

## 2020-05-08 NOTE — Progress Notes (Signed)
K level 3.0; MD aware with order for 40 mEq PO x 1 dose.

## 2020-05-08 NOTE — Progress Notes (Signed)
PROGRESS NOTE                                                                                                                                                                                                             Patient Demographics:    Jennifer Wu, is a 80 y.o. female, DOB - [birth year], RWE:[id number]  Outpatient Primary MD for the patient is [name], MD   Admit date - 05/06/2020   LOS - 1  Chief Complaint  Patient presents with  . Altered Mental Status       Brief Narrative: Patient is a 80 y.o. female with PMHx of HLD, HTN, DM-2-presented with 4 to 5-day history of fever, diarrhea-upon further evaluation-she was found to have acute gastroenteritis and mild hypoxemia due to COVID-19 infection.  See below for further details.  COVID-19 vaccinated status: Vaccinated-second Pfizer vaccine on October 2021  Significant Events: 2/10>> Admit to [facility] for COVID-19 related gastroenteritis and hypoxemia  Significant studies: 2/11>>Chest x-ray: Cardiomegaly/vascular congestion-right hilar fullness  COVID-19 medications: Steroids: 2/10>> Remdesivir: 2/10>>  Antibiotics: None  Microbiology data: 2/11 >>blood culture: Pending 2/11>> urine culture: Pending  Procedures: None  Consults: None  DVT prophylaxis: enoxaparin (LOVENOX) injection 40 mg Start: 05/07/20 1000   Subjective:   Feels better-diarrhea has slowed down significantly.  Titrated to room air early this morning.   Assessment  & Plan :   COVID-19 related gastroenteritis-with pneumonia with acute hypoxic respiratory failure: Improved-titrated to room air earlier this morning-diarrhea has slowed down significantly.  CRP still remains significantly elevated.  Continue steroid/Remdesivir-await culture data.  Suspect that if clinical improvement continues-she should be able to go home in the next few days.   Fever: afebrile O2  requirements:  SpO2: 96 % O2 Flow Rate (L/min): 2 L/min   COVID-19 Labs: Recent Labs    05/08/20 0033  DDIMER 4.36*  CRP 25.4*    No results found for: BNP  Recent Labs  Lab 05/07/20 0609  PROCALCITON 1.11    Lab Results  Component Value Date   SARSCOV2NAA POSITIVE (A) 05/07/2020     Prone/Incentive Spirometry: encouraged incentive spirometry use 3-4/hour.  Elevated D-dimer: Due to COVID-19 related inflammation-not hypoxic-check lower extremity Doppler-on prophylactic Lovenox  Acute metabolic encephalopathy: Due to hypoxia/COVID-19 infection-resolved-she is completely awake and alert  Hypokalemia: Replete and recheck  HTN: Stable-continue  amlodipine  HLD: Continue statin  DM-2 (A1c 6.8 on 2/11): Place on SSI and follow closely.  Recent Labs    05/07/20 2109 05/08/20 0742 05/08/20 1208  GLUCAP 214* 253* 298*    Deconditioning/debility: Proving-appreciate rehab services eval.   ABG:    Component Value Date/Time   TCO2 27 03/30/2017 1504    Vent Settings: N/A    Condition - Stable  Family Communication  :  Spouse-Carl-[phone]-left voicemail  Code Status :  Full Code  Diet :  Diet Order            Diet Heart Room service appropriate? Yes; Fluid consistency: Thin  Diet effective now                  Disposition Plan  :   Status is: Inpatient  Remains inpatient appropriate because:Inpatient level of care appropriate due to severity of illness   Dispo: The patient is from: Home              Anticipated d/c is to: Home              Anticipated d/c date is: 2 days              Patient currently is not medically stable to d/c.   Difficult to place patient No   Barriers to discharge: Hypoxia requiring O2 supplementation/complete 5 days of IV Remdesivir  Antimicorbials  :    Anti-infectives (From admission, onward)   Start     Dose/Rate Route Frequency Ordered Stop   05/08/20 1000  remdesivir 100 mg in sodium chloride 0.9 % 100 mL  IVPB       "Followed by" Linked Group Details   100 mg 200 mL/hr over 30 Minutes Intravenous Daily 05/07/20 0428 05/12/20 0959   05/07/20 0530  remdesivir 200 mg in sodium chloride 0.9% 250 mL IVPB       "Followed by" Linked Group Details   200 mg 580 mL/hr over 30 Minutes Intravenous Once 05/07/20 0428 05/07/20 0656   05/07/20 0000  cefTRIAXone (ROCEPHIN) 2 g in sodium chloride 0.9 % 100 mL IVPB        2 g 200 mL/hr over 30 Minutes Intravenous  Once 05/06/20 2349 05/07/20 0135      Inpatient Medications  Scheduled Meds: . amLODipine  5 mg Oral Daily  . vitamin C  500 mg Oral Daily  . atorvastatin  20 mg Oral Daily  . enoxaparin (LOVENOX) injection  40 mg Subcutaneous Daily  . famotidine  40 mg Oral QHS  . insulin aspart  0-9 Units Subcutaneous TID WC  . methylPREDNISolone (SOLU-MEDROL) injection  45 mg Intravenous BID   Followed by  . [START ON 05/10/2020] predniSONE  50 mg Oral Daily  . pantoprazole  40 mg Oral BID AC  . potassium chloride  40 mEq Oral Q6H  . zinc sulfate  220 mg Oral Daily   Continuous Infusions: . remdesivir 100 mg in NS 100 mL 100 mg (05/08/20 1227)   PRN Meds:.acetaminophen **OR** acetaminophen   Time Spent in minutes  25  See all Orders from today for further details   Oren Binet M.D on 05/08/2020 at 4:03 PM  To page go to [email or web address] - use universal password  [facility] -  Office  [phone]    Objective:   Vitals:   05/08/20 0200 05/08/20 0400 05/08/20 0600 05/08/20 1530  BP:  135/74  117/66  Pulse:  73  78  Resp:  Marland Kitchen)  21  19  Temp:  97.7 F (36.5 C)  97.6 F (36.4 C)  TempSrc:  Oral  Oral  SpO2: (!) 87% 95% 92% 96%  Weight:  97.8 kg    Height:        Wt Readings from Last 3 Encounters:  05/08/20 97.8 kg  11/24/19 93.9 kg  08/18/19 91.2 kg     Intake/Output Summary (Last 24 hours) at 05/08/2020 1603 Last data filed at 05/08/2020 0800 Gross per 24 hour  Intake 1400 ml  Output 400 ml  Net 1000 ml      Physical Exam Gen Exam:Alert awake-not in any distress HEENT:atraumatic, normocephalic Chest: B/L clear to auscultation anteriorly CVS:S1S2 regular Abdomen:soft non tender, non distended Extremities:no edema Neurology: Non focal Skin: no rash   Data Review:    CBC Recent Labs  Lab 05/07/20 0025 05/07/20 0609 05/08/20 0033  WBC 4.9 5.2 5.9  HGB 12.1 11.4* 11.1*  HCT 38.7 36.1 34.2*  PLT 163 210 241  MCV 86.2 85.5 83.8  MCH 26.9 27.0 27.2  MCHC 31.3 31.6 32.5  RDW 13.4 13.6 13.3  LYMPHSABS 0.9  --  1.0  MONOABS 0.9  --  0.8  EOSABS 0.0  --  0.0  BASOSABS 0.0  --  0.0    Chemistries  Recent Labs  Lab 05/07/20 0025 05/07/20 0609 05/07/20 1552 05/08/20 0033  NA 134*  --  134* 133*  K 2.3*  --  3.0* 3.0*  CL 95*  --  100 99  CO2 26  --  24 23  GLUCOSE 184*  --  250* 223*  BUN 12  --  10 16  CREATININE 0.80 0.75 0.69 0.71  CALCIUM 8.2*  --  7.9* 8.6*  MG  --  1.8 1.6*  --   AST 16  --   --  20  ALT 12  --   --  17  ALKPHOS 64  --   --  59  BILITOT 1.2  --   --  0.5   ------------------------------------------------------------------------------------------------------------------ No results for input(s): CHOL, HDL, LDLCALC, TRIG, CHOLHDL, LDLDIRECT in the last 72 hours.  Lab Results  Component Value Date   HGBA1C 6.8 (H) 05/07/2020   ------------------------------------------------------------------------------------------------------------------ No results for input(s): TSH, T4TOTAL, T3FREE, THYROIDAB in the last 72 hours.  Invalid input(s): FREET3 ------------------------------------------------------------------------------------------------------------------ No results for input(s): VITAMINB12, FOLATE, FERRITIN, TIBC, IRON, RETICCTPCT in the last 72 hours.  Coagulation profile Recent Labs  Lab 05/07/20 0025  INR 1.2    Recent Labs    05/08/20 0033  DDIMER 4.36*    Cardiac Enzymes No results for input(s): CKMB, TROPONINI, MYOGLOBIN  in the last 168 hours.  Invalid input(s): CK ------------------------------------------------------------------------------------------------------------------ No results found for: BNP  Micro Results Recent Results (from the past 240 hour(s))  Resp Panel by RT-PCR (Flu A&B, Covid) Nasopharyngeal Swab     Status: Abnormal   Collection Time: 05/07/20 12:25 AM   Specimen: Nasopharyngeal Swab; Nasopharyngeal(NP) swabs in vial transport medium  Result Value Ref Range Status   SARS Coronavirus 2 by RT PCR POSITIVE (A) NEGATIVE Final    Comment: RESULT CALLED TO, READ BACK BY AND VERIFIED WITH: K ISLEY RN 05/07/20 0259 JDW (NOTE) SARS-CoV-2 target nucleic acids are DETECTED.  The SARS-CoV-2 RNA is generally detectable in upper respiratory specimens during the acute phase of infection. Positive results are indicative of the presence of the identified virus, but do not rule out bacterial infection or co-infection with other pathogens not detected by the test.  Clinical correlation with patient history and other diagnostic information is necessary to determine patient infection status. The expected result is Negative.  Fact Sheet for Patients: EntrepreneurPulse.com.au  Fact Sheet for Healthcare Providers: IncredibleEmployment.be  This test is not yet approved or cleared by the [place] FDA and  has been authorized for detection and/or diagnosis of SARS-CoV-2 by FDA under an Emergency Use Authorization (EUA).  This EUA will remain in effect (meaning this test can be used)  for the duration of  the COVID-19 declaration under Section 564(b)(1) of the Act, 21 U.S.C. section 360bbb-3(b)(1), unless the authorization is terminated or revoked sooner.     Influenza A by PCR NEGATIVE NEGATIVE Final   Influenza B by PCR NEGATIVE NEGATIVE Final    Comment: (NOTE) The Xpert Xpress SARS-CoV-2/FLU/RSV plus assay is intended as an aid in the diagnosis of  influenza from Nasopharyngeal swab specimens and should not be used as a sole basis for treatment. Nasal washings and aspirates are unacceptable for Xpert Xpress SARS-CoV-2/FLU/RSV testing.  Fact Sheet for Patients: EntrepreneurPulse.com.au  Fact Sheet for Healthcare Providers: IncredibleEmployment.be  This test is not yet approved or cleared by the [place] FDA and has been authorized for detection and/or diagnosis of SARS-CoV-2 by FDA under an Emergency Use Authorization (EUA). This EUA will remain in effect (meaning this test can be used) for the duration of the COVID-19 declaration under Section 564(b)(1) of the Act, 21 U.S.C. section 360bbb-3(b)(1), unless the authorization is terminated or revoked.  Performed at [facility], [place] [address], [place], [place] [address]   Urine culture     Status: None   Collection Time: 05/07/20 12:43 AM   Specimen: In/Out Cath Urine  Result Value Ref Range Status   Specimen Description IN/OUT CATH URINE  Final   Special Requests NONE  Final   Culture   Final    NO GROWTH Performed at [facility], [place] [address] [place], [place] [address]    Report Status 05/08/2020 FINAL  Final  C Difficile Quick Screen w PCR reflex     Status: None   Collection Time: 05/08/20  2:52 AM   Specimen: STOOL  Result Value Ref Range Status   C Diff antigen NEGATIVE NEGATIVE Final   C Diff toxin NEGATIVE NEGATIVE Final   C Diff interpretation No C. difficile detected.  Final    Comment: Performed at [facility], [place] [address], [place], [place] [address]    Radiology Reports DG Chest [place] 1 View  Result Date: 05/07/2020 CLINICAL DATA:  Questionable sepsis EXAM: PORTABLE CHEST 1 VIEW COMPARISON:  05/29/2017 FINDINGS: Cardiomegaly, vascular congestion. Prominent right hilum could be vascular or adenopathy. Interstitial prominence throughout the lungs, likely early interstitial edema. No effusions.  No acute bony abnormality. IMPRESSION: Cardiomegaly with vascular congestion and possible early interstitial edema. Right hilar fullness could reflect vascular prominence or adenopathy. Electronically Signed   By: Rolm Baptise M.D.   On: 05/07/2020 00:10

## 2020-05-08 NOTE — Plan of Care (Signed)

## 2020-05-08 NOTE — Evaluation (Addendum)
Physical Therapy Evaluation & Discharge Patient Details Name: Jennifer Wu MRN: [id number] DOB: [birth year] Today's Date: 05/08/2020   History of Present Illness  Pt is a 80 y.o. female admitted 05/06/20 with fever, diarrhea; workup for acute gastroenteritis, mild hypoxemis due to COVID-19 infection. PMH includes HTN, DM2.    Clinical Impression  Patient evaluated by Physical Therapy with no further acute PT needs identified. PTA, pt independent, active, drives and lives with supportive family. Today, pt ambulating and performing seated/standing ADL tasks at supervision-level; SpO2 >/90% on RA with ambulation - no DOE noted and pt talking majority of walk. Educ re: activity recommendations, fall risk reduction, importance of mobility. All education has been completed and the patient has no further questions. Acute PT is signing off. Thank you for this referral.    Follow Up Recommendations No PT follow up    Equipment Recommendations  None recommended by PT    Recommendations for Other Services       Precautions / Restrictions Precautions Precautions: Fall;Other (comment) Precaution Comments: Urinary incontinence (wears Depends at home-has some in room) Restrictions Weight Bearing Restrictions: No      Mobility  Bed Mobility Overal bed mobility: Independent                  Transfers Overall transfer level: Needs assistance Equipment used: None Transfers: Sit to/from Stand Sit to Stand: Supervision            Ambulation/Gait Ambulation/Gait assistance: Min guard;Supervision Gait Distance (Feet): 400 Feet Assistive device: None;IV Pole Gait Pattern/deviations: Step-through pattern;Decreased stride length Gait velocity: Decreased Gait velocity interpretation: 1.31 - 2.62 ft/sec, indicative of limited community ambulator General Gait Details: Slow, mostly steady gait with and without pushing IV pole; initial self-corrected instabiltiy with first few steps, min  guard for balance, progressing to supervision. SpO2 >/90% on RA; no DOE noted and pt talking majority of walk  Financial [employer] (Stroke Patients Only)       Balance Overall balance assessment: Mild deficits observed, not formally tested                                           Pertinent Vitals/Pain Pain Assessment: Faces Faces Pain Scale: Hurts a little bit Pain Location: Abdomen Pain Descriptors / Indicators: Discomfort Pain Intervention(s): Monitored during session    Home Living Family/patient expects to be discharged to:: Private residence Living Arrangements: Spouse/significant other;Children Available Help at Discharge: Family;Available 24 hours/day Type of Home: House Home Access: Stairs to enter Entrance Stairs-Rails: None Entrance Stairs-Number of Steps: 4 Home Layout: Two level;Able to live on main level with bedroom/bathroom Home Equipment: Gilford Rile - 2 wheels;Cane - single point;Shower seat Additional Comments: Has a lot of DME from mother-in-law who she cared for; husband uses RW and shower seat, pt helps him at home. Daughter's family lives with pt as well    Prior Function Level of Independence: Independent         Comments: Independent without DME, drives, enjoys walking at [employer] and shopping at [place]. Helps care for her husband (ADL tasks).     Hand Dominance        Extremity/Trunk Assessment   Upper Extremity Assessment Upper Extremity Assessment: Overall WFL for tasks assessed    Lower Extremity Assessment Lower Extremity Assessment: Overall  WFL for tasks assessed       Communication   Communication: No difficulties  Cognition Arousal/Alertness: Awake/alert Behavior During Therapy: WFL for tasks assessed/performed Overall Cognitive Status: Within Functional Limits for tasks assessed                                 General Comments: Pt verbose affecting her  attention, responding well to redirection; [facility] for simple tasks      General Comments General comments (skin integrity, edema, etc.): Discussed initial use of SPC upon return home for added stability, pt reports understanding    Exercises     Assessment/Plan    PT Assessment Patent does not need any further PT services  PT Problem List         PT Treatment Interventions      PT Goals (Current goals can be found in the Care Plan section)  Acute Rehab PT Goals PT Goal Formulation: All assessment and education complete, DC therapy    Frequency     Barriers to discharge        Co-evaluation               AM-PAC PT "6 Clicks" Mobility  Outcome Measure Help needed turning from your back to your side while in a flat bed without using bedrails?: None Help needed moving from lying on your back to sitting on the side of a flat bed without using bedrails?: None Help needed moving to and from a bed to a chair (including a wheelchair)?: A Little Help needed standing up from a chair using your arms (e.g., wheelchair or bedside chair)?: A Little Help needed to walk in hospital room?: A Little Help needed climbing 3-5 steps with a railing? : A Little 6 Click Score: 20    End of Session Equipment Utilized During Treatment: Gait belt Activity Tolerance: Patient tolerated treatment well Patient left: with nursing/sitter in room (seated at sink with NT for washup) Nurse Communication: Mobility status PT Visit Diagnosis: Other abnormalities of gait and mobility (R26.89)    Time: 0254-2706 PT Time Calculation (min) (ACUTE ONLY): 25 min   Charges:   PT Evaluation $PT Eval Moderate Complexity: 1 Mod PT Treatments $Gait Training: 8-22 mins   Mabeline Caras, PT, DPT Acute Rehabilitation Services  Pager [phone] Office [phone]  Derry Lory 05/08/2020, 1:38 PM

## 2020-05-09 ENCOUNTER — Other Ambulatory Visit (HOSPITAL_COMMUNITY): Payer: Self-pay | Admitting: Internal Medicine

## 2020-05-09 ENCOUNTER — Inpatient Hospital Stay (HOSPITAL_COMMUNITY): Payer: Medicare PPO

## 2020-05-09 DIAGNOSIS — R7989 Other specified abnormal findings of blood chemistry: Secondary | ICD-10-CM

## 2020-05-09 DIAGNOSIS — J9600 Acute respiratory failure, unspecified whether with hypoxia or hypercapnia: Secondary | ICD-10-CM | POA: Diagnosis not present

## 2020-05-09 DIAGNOSIS — U071 COVID-19: Secondary | ICD-10-CM | POA: Diagnosis not present

## 2020-05-09 LAB — CBC WITH DIFFERENTIAL/PLATELET
Abs Immature Granulocytes: 0.14 10*3/uL — ABNORMAL HIGH (ref 0.00–0.07)
Basophils Absolute: 0 10*3/uL (ref 0.0–0.1)
Basophils Relative: 1 %
Eosinophils Absolute: 0 10*3/uL (ref 0.0–0.5)
Eosinophils Relative: 0 %
HCT: 35.8 % — ABNORMAL LOW (ref 36.0–46.0)
Hemoglobin: 11.1 g/dL — ABNORMAL LOW (ref 12.0–15.0)
Immature Granulocytes: 2 %
Lymphocytes Relative: 16 %
Lymphs Abs: 1 10*3/uL (ref 0.7–4.0)
MCH: 27.3 pg (ref 26.0–34.0)
MCHC: 31 g/dL (ref 30.0–36.0)
MCV: 88.2 fL (ref 80.0–100.0)
Monocytes Absolute: 0.8 10*3/uL (ref 0.1–1.0)
Monocytes Relative: 12 %
Neutro Abs: 4.3 10*3/uL (ref 1.7–7.7)
Neutrophils Relative %: 69 %
Platelets: 196 10*3/uL (ref 150–400)
RBC: 4.06 MIL/uL (ref 3.87–5.11)
RDW: 13.4 % (ref 11.5–15.5)
WBC: 6.3 10*3/uL (ref 4.0–10.5)
nRBC: 0 % (ref 0.0–0.2)

## 2020-05-09 LAB — GLUCOSE, CAPILLARY
Glucose-Capillary: 214 mg/dL — ABNORMAL HIGH (ref 70–99)
Glucose-Capillary: 250 mg/dL — ABNORMAL HIGH (ref 70–99)

## 2020-05-09 LAB — MAGNESIUM: Magnesium: 2.3 mg/dL (ref 1.7–2.4)

## 2020-05-09 LAB — D-DIMER, QUANTITATIVE: D-Dimer, Quant: 1.41 ug/mL-FEU — ABNORMAL HIGH (ref 0.00–0.50)

## 2020-05-09 LAB — COMPREHENSIVE METABOLIC PANEL
ALT: 23 U/L (ref 0–44)
AST: 25 U/L (ref 15–41)
Albumin: 2.7 g/dL — ABNORMAL LOW (ref 3.5–5.0)
Alkaline Phosphatase: 65 U/L (ref 38–126)
Anion gap: 9 (ref 5–15)
BUN: 19 mg/dL (ref 8–23)
CO2: 21 mmol/L — ABNORMAL LOW (ref 22–32)
Calcium: 8.6 mg/dL — ABNORMAL LOW (ref 8.9–10.3)
Chloride: 102 mmol/L (ref 98–111)
Creatinine, Ser: 0.58 mg/dL (ref 0.44–1.00)
GFR, Estimated: >60 mL/min (ref >60)
Glucose, Bld: 266 mg/dL — ABNORMAL HIGH (ref 70–99)
Potassium: 4.7 mmol/L (ref 3.5–5.1)
Sodium: 132 mmol/L — ABNORMAL LOW (ref 135–145)
Total Bilirubin: 0.4 mg/dL (ref 0.3–1.2)
Total Protein: 5.6 g/dL — ABNORMAL LOW (ref 6.5–8.1)

## 2020-05-09 LAB — C-REACTIVE PROTEIN: CRP: 12.2 mg/dL — ABNORMAL HIGH (ref <1.0)

## 2020-05-09 MED ORDER — METHYLPREDNISOLONE SODIUM SUCC 40 MG IJ SOLR
40.0000 mg | Freq: Two times a day (BID) | INTRAMUSCULAR | Status: DC
  Administered 2020-05-09: 40 mg via INTRAVENOUS
  Filled 2020-05-09: qty 1

## 2020-05-09 MED ORDER — APIXABAN 2.5 MG PO TABS: 2.5000 mg | ORAL_TABLET | Freq: Two times a day (BID) | ORAL | 0 refills | Status: DC

## 2020-05-09 MED ORDER — LOPERAMIDE HCL 2 MG PO CAPS: 2.0000 mg | ORAL_CAPSULE | Freq: Three times a day (TID) | ORAL | 0 refills | Status: DC | PRN

## 2020-05-09 MED ORDER — METHYLPREDNISOLONE 4 MG PO TBPK: ORAL_TABLET | ORAL | 0 refills | Status: DC

## 2020-05-09 MED ORDER — LOPERAMIDE HCL 2 MG PO CAPS
4.0000 mg | ORAL_CAPSULE | Freq: Once | ORAL | Status: AC
  Administered 2020-05-09: 4 mg via ORAL

## 2020-05-09 MED ORDER — ALBUTEROL SULFATE HFA 108 (90 BASE) MCG/ACT IN AERS
2.0000 | INHALATION_SPRAY | Freq: Four times a day (QID) | RESPIRATORY_TRACT | 0 refills | Status: DC | PRN
Start: 2020-05-09 — End: 2021-08-08

## 2020-05-09 NOTE — Progress Notes (Signed)
OT Cancellation Note  Patient Details Name: Jennifer Wu MRN: [id number] DOB: [birth year]   Cancelled Treatment:    Reason Eval/Treat Not Completed: OT screened, no needs identified, will sign off (Discussed with nursing)  [place], OT/L   Acute OT Clinical Specialist [place] Pager [phone] Office [phone]  05/09/2020, 12:21 PM

## 2020-05-09 NOTE — Progress Notes (Signed)
SATURATION QUALIFICATIONS: (This note is used to comply with regulatory documentation for home oxygen)  Patient Saturations on Room Air at Rest = 95%  Patient Saturations on Room Air while Ambulating = 91%  Patient Saturations on 0 Liters of oxygen while Ambulating = 91%  Please briefly explain why patient needs home oxygen: 

## 2020-05-09 NOTE — Discharge Summary (Signed)
Jennifer Wu OMV:[id number] DOB: 25-Feb-1941 DOA: 05/06/2020  PCP: [name], MD  Admit date: 05/06/2020  Discharge date: 05/09/2020  Admitted From: Home   Disposition:  Home   Recommendations for Outpatient Follow-up:   Follow up with PCP in 1-2 weeks  PCP Please obtain BMP/CBC, 2 view CXR in 1week,  (see Discharge instructions)   PCP Please follow up on the following pending results:    Home Health: None   Equipment/Devices: None  Consultations: None  Discharge Condition: Stable    CODE STATUS: Full    Diet Recommendation: Heart Healthy   Diet Order            Diet - low sodium heart healthy           Diet Heart Room service appropriate? Yes; Fluid consistency: Thin  Diet effective now                  Chief Complaint  Patient presents with  . Altered Mental Status     Brief history of present illness from the day of admission and additional interim summary    Patient is a 80 y.o. female with PMHx of HLD, HTN, DM-2-presented with 4 to 5-day history of fever, diarrhea-upon further evaluation-she was found to have acute gastroenteritis and mild hypoxemia due to COVID-19 infection.  See below for further details.  COVID-19 vaccinated status: Vaccinated-second Pfizer vaccine on October 2021  Significant Events: 2/10>> Admit to [facility] for COVID-19 related gastroenteritis and hypoxemia  Significant studies: 2/11>>Chest x-ray: Cardiomegaly/vascular congestion-right hilar fullness  COVID-19 medications: Steroids: 2/10>> Remdesivir: 2/10>>                                                                  Hospital Course   Acute COVID-19 gastroenteritis.  No pulmonary symptoms, mild diarrhea and dehydration, with IV fluids and supportive care much improved, ambulated in the hallway  without any distress work with PT, symptom-free eager to go home will be discharged home on as needed Imodium.  Recent Labs  Lab 05/07/20 0025 05/07/20 0609 05/08/20 0033 05/09/20 0150  WBC 4.9 5.2 5.9 6.3  CRP  --   --  25.4* 12.2*  DDIMER  --   --  4.36* 1.41*  PROCALCITON  --  1.11  --   --   LATICACIDVEN 0.9  --   --   --   AST 16  --  20 25  ALT 12  --  17 23  ALKPHOS 64  --  59 65  BILITOT 1.2  --  0.5 0.4  ALBUMIN 3.0*  --  2.8* 2.7*  INR 1.2  --   --   --   SARSCOV2NAA POSITIVE*  --   --   --  Elevated D-dimer: Due to COVID-19 related inflammation-no hypoxia or chest symptoms, no leg swelling, D-dimer has come down on prophylactic Lovenox, will give a total 10 days of oral Eliquis upon discharge thereafter follow with PCP.   Acute metabolic encephalopathy: Due to hypoxia/COVID-19 infection-resolved-she is completely awake and alert, resolved  Hypokalemia: Placed and stable.  HTN: Stable-continue amlodipine  HLD: Continue statin  DM-2 (A1c 6.8 on 2/11): Follow with PCP for outpatient monitoring and management   Discharge diagnosis     Principal Problem:   Acute respiratory failure due to COVID-19 [facility]) Active Problems:   Essential hypertension   HLD (hyperlipidemia)    Discharge instructions    Discharge Instructions    Diet - low sodium heart healthy   Complete by: As directed    Discharge instructions   Complete by: As directed    Follow with Primary MD [name], MD in 7 days   Get CBC, CMP, 2 view Chest X ray -  checked next visit within 1 week by Primary MD    Activity: As tolerated with Full fall precautions use walker/cane & assistance as needed  Disposition Home    Diet: Heart Healthy    Special Instructions: If you have smoked or chewed Tobacco  in the last 2 yrs please stop smoking, stop any regular Alcohol  and or any Recreational drug use.  On your next visit with your primary care physician please Get Medicines  reviewed and adjusted.  Please request your Prim.MD to go over all Hospital Tests and Procedure/Radiological results at the follow up, please get all Hospital records sent to your Prim MD by signing hospital release before you go home.  If you experience worsening of your admission symptoms, develop shortness of breath, life threatening emergency, suicidal or homicidal thoughts you must seek medical attention immediately by calling 911 or calling your MD immediately  if symptoms less severe.  You Must read complete instructions/literature along with all the possible adverse reactions/side effects for all the Medicines you take and that have been prescribed to you. Take any new Medicines after you have completely understood and accpet all the possible adverse reactions/side effects.   Increase activity slowly   Complete by: As directed       Discharge Medications   Allergies as of 05/09/2020      Reactions   Lisinopril Nausea And Vomiting      Medication List    STOP taking these medications   ibuprofen 200 MG tablet Commonly known as: ADVIL   predniSONE 10 MG tablet Commonly known as: DELTASONE     TAKE these medications   albuterol 108 (90 Base) MCG/ACT inhaler Commonly known as: VENTOLIN HFA Inhale 2 puffs into the lungs every 6 (six) hours as needed for wheezing or shortness of breath.   amLODipine 5 MG tablet Commonly known as: NORVASC TAKE 1 TABLET BY MOUTH DAILY.   apixaban 2.5 MG Tabs tablet Commonly known as: Eliquis Take 1 tablet (2.5 mg total) by mouth 2 (two) times daily.   atorvastatin 20 MG tablet Commonly known as: LIPITOR TAKE 1 TABLET BY MOUTH DAILY.   chlorpheniramine 4 MG tablet Commonly known as: CHLOR-TRIMETON Take 4 mg by mouth every 4 (four) hours as needed for allergies.   cyclobenzaprine 5 MG tablet Commonly known as: FLEXERIL Take 1 tablet (5 mg total) by mouth 3 (three) times daily as needed for muscle spasms.   famotidine 40 MG  tablet Commonly known as: PEPCID Take 1 tablet (40  mg total) by mouth daily.   furosemide 20 MG tablet Commonly known as: LASIX TAKE 1 TABLET (20 MG TOTAL) BY MOUTH DAILY. STOP HYDROCHLOROTHIAZIDE   gabapentin 300 MG capsule Commonly known as: NEURONTIN TAKE 1 CAPSULE BY MOUTH THREE TIMES DAILY.   loperamide 2 MG capsule Commonly known as: IMODIUM Take 1 capsule (2 mg total) by mouth every 8 (eight) hours as needed for diarrhea or loose stools.   methylPREDNISolone 4 MG Tbpk tablet Commonly known as: MEDROL DOSEPAK follow package directions   montelukast 10 MG tablet Commonly known as: SINGULAIR TAKE 1 TABLET BY MOUTH AT BEDTIME.   pantoprazole 40 MG tablet Commonly known as: PROTONIX TAKE 1 TABLET BY MOUTH TWO TIMES DAILY   VITAMIN D PO Take 1 capsule by mouth daily.        Follow-up Information    [name], MD. Schedule an appointment as soon as possible for a visit in 1 week(s).   Specialty: Family Medicine Contact information: [address] [place] [address] [phone]               Major procedures and Radiology Reports - PLEASE review detailed and final reports thoroughly  -        DG Chest Port 1 View  Result Date: 05/07/2020 CLINICAL DATA:  Questionable sepsis EXAM: PORTABLE CHEST 1 VIEW COMPARISON:  05/29/2017 FINDINGS: Cardiomegaly, vascular congestion. Prominent right hilum could be vascular or adenopathy. Interstitial prominence throughout the lungs, likely early interstitial edema. No effusions. No acute bony abnormality. IMPRESSION: Cardiomegaly with vascular congestion and possible early interstitial edema. Right hilar fullness could reflect vascular prominence or adenopathy. Electronically Signed   By: Rolm Baptise M.D.   On: 05/07/2020 00:10    Micro Results     Recent Results (from the past 240 hour(s))  Blood Culture (routine x 2)     Status: None (Preliminary result)   Collection Time: 05/07/20 12:23 AM    Specimen: BLOOD LEFT FOREARM  Result Value Ref Range Status   Specimen Description BLOOD LEFT FOREARM  Final   Special Requests   Final    BOTTLES DRAWN AEROBIC AND ANAEROBIC Blood Culture adequate volume   Culture   Final    NO GROWTH 1 DAY Performed at [facility], [place] [address], [place], [place] [address]    Report Status PENDING  Incomplete  Resp Panel by RT-PCR (Flu A&B, Covid) Nasopharyngeal Swab     Status: Abnormal   Collection Time: 05/07/20 12:25 AM   Specimen: Nasopharyngeal Swab; Nasopharyngeal(NP) swabs in vial transport medium  Result Value Ref Range Status   SARS Coronavirus 2 by RT PCR POSITIVE (A) NEGATIVE Final    Comment: RESULT CALLED TO, READ BACK BY AND VERIFIED WITH: K ISLEY RN 05/07/20 0259 JDW (NOTE) SARS-CoV-2 target nucleic acids are DETECTED.  The SARS-CoV-2 RNA is generally detectable in upper respiratory specimens during the acute phase of infection. Positive results are indicative of the presence of the identified virus, but do not rule out bacterial infection or co-infection with other pathogens not detected by the test. Clinical correlation with patient history and other diagnostic information is necessary to determine patient infection status. The expected result is Negative.  Fact Sheet for Patients: EntrepreneurPulse.com.au  Fact Sheet for Healthcare Providers: IncredibleEmployment.be  This test is not yet approved or cleared by the [place] FDA and  has been authorized for detection and/or diagnosis of SARS-CoV-2 by FDA under an Emergency Use Authorization (EUA).  This EUA will remain in effect (meaning this test can be used)  for the duration of  the COVID-19 declaration under Section 564(b)(1) of the Act, 21 U.S.C. section 360bbb-3(b)(1), unless the authorization is terminated or revoked sooner.     Influenza A by PCR NEGATIVE NEGATIVE Final   Influenza B by PCR NEGATIVE NEGATIVE Final     Comment: (NOTE) The Xpert Xpress SARS-CoV-2/FLU/RSV plus assay is intended as an aid in the diagnosis of influenza from Nasopharyngeal swab specimens and should not be used as a sole basis for treatment. Nasal washings and aspirates are unacceptable for Xpert Xpress SARS-CoV-2/FLU/RSV testing.  Fact Sheet for Patients: EntrepreneurPulse.com.au  Fact Sheet for Healthcare Providers: IncredibleEmployment.be  This test is not yet approved or cleared by the [place] FDA and has been authorized for detection and/or diagnosis of SARS-CoV-2 by FDA under an Emergency Use Authorization (EUA). This EUA will remain in effect (meaning this test can be used) for the duration of the COVID-19 declaration under Section 564(b)(1) of the Act, 21 U.S.C. section 360bbb-3(b)(1), unless the authorization is terminated or revoked.  Performed at [facility], [place] [address] [place], [place] [address]   Blood Culture (routine x 2)     Status: None (Preliminary result)   Collection Time: 05/07/20 12:25 AM   Specimen: BLOOD  Result Value Ref Range Status   Specimen Description BLOOD LEFT ANTECUBITAL  Final   Special Requests   Final    BOTTLES DRAWN AEROBIC AND ANAEROBIC Blood Culture adequate volume   Culture   Final    NO GROWTH 1 DAY Performed at [facility], [place] [address], [place], [place] [address]    Report Status PENDING  Incomplete  Urine culture     Status: None   Collection Time: 05/07/20 12:43 AM   Specimen: In/Out Cath Urine  Result Value Ref Range Status   Specimen Description IN/OUT CATH URINE  Final   Special Requests NONE  Final   Culture   Final    NO GROWTH Performed at [facility], [place] [address] [place], [place] [address]    Report Status 05/08/2020 FINAL  Final  C Difficile Quick Screen w PCR reflex     Status: None   Collection Time: 05/08/20  2:52 AM   Specimen: STOOL  Result Value Ref Range Status   C Diff  antigen NEGATIVE NEGATIVE Final   C Diff toxin NEGATIVE NEGATIVE Final   C Diff interpretation No C. difficile detected.  Final    Comment: Performed at [facility], [place] [address] [place], [place] [address]    Today   Subjective    Jennifer Wu today has no headache,no chest abdominal pain,no new weakness tingling or numbness, feels much better wants to go home today.     Objective   Blood pressure (!) 143/90, pulse 83, temperature 97.8 F (36.6 C), temperature source Oral, resp. rate 20, height 5\' 5"  (1.651 m), weight 97.8 kg, SpO2 95 %.   Intake/Output Summary (Last 24 hours) at 05/09/2020 1059 Last data filed at 05/09/2020 0915 Gross per 24 hour  Intake 735 ml  Output --  Net 735 ml    Exam  Awake Alert, No new F.N deficits, Normal affect [place].AT,PERRAL Supple Neck,No JVD, No cervical lymphadenopathy appriciated.  Symmetrical Chest wall movement, Good air movement bilaterally, CTAB RRR,No Gallops,Rubs or new Murmurs, No Parasternal Heave +ve B.Sounds, Abd Soft, Non tender, No organomegaly appriciated, No rebound -guarding or  rigidity. No Cyanosis, Clubbing or edema, No new Rash or bruise   Data Review   CBC w Diff:  Lab Results  Component Value Date   WBC 6.3 05/09/2020   HGB 11.1 (L) 05/09/2020   HGB 12.5 09/28/2009   HCT 35.8 (L) 05/09/2020   HCT 37.8 09/28/2009   PLT 196 05/09/2020   PLT 218 09/28/2009   LYMPHOPCT 16 05/09/2020   LYMPHOPCT 37.1 09/28/2009   MONOPCT 12 05/09/2020   MONOPCT 7.2 09/28/2009   EOSPCT 0 05/09/2020   EOSPCT 2.7 09/28/2009   BASOPCT 1 05/09/2020   BASOPCT 0.3 09/28/2009    CMP:  Lab Results  Component Value Date   NA 132 (L) 05/09/2020   K 4.7 05/09/2020   CL 102 05/09/2020   CO2 21 (L) 05/09/2020   BUN 19 05/09/2020   CREATININE 0.58 05/09/2020   CREATININE 0.72 06/16/2019   PROT 5.6 (L) 05/09/2020   ALBUMIN 2.7 (L) 05/09/2020   BILITOT 0.4 05/09/2020   ALKPHOS 65 05/09/2020   AST 25 05/09/2020   ALT 23  05/09/2020  .   Total Time in preparing paper work, data evaluation and todays exam - 72 minutes  Lala Lund M.D on 05/09/2020 at 10:59 AM  [facility]

## 2020-05-09 NOTE — Care Management (Signed)
Patient to Dc w Eliquis. Will be provided with Eliquis 30 day free manufacture's coupon for first fill.

## 2020-05-09 NOTE — Plan of Care (Signed)
Went over d/c instructions with patient.  Patient verbalized an understanding.  Also spoke with daughter and husband.  Patient does not require oxygen, printed scripts given to her to have her fill at her pharmacy, coupon for eliquis sent home.  Patient is ready for discharge.

## 2020-05-09 NOTE — Discharge Instructions (Signed)
Follow with Primary MD [name], MD in 7 days   Get CBC, CMP, 2 view Chest X ray -  checked next visit within 1 week by Primary MD    Activity: As tolerated with Full fall precautions use walker/cane & assistance as needed  Disposition Home    Diet: Heart Healthy    Special Instructions: If you have smoked or chewed Tobacco  in the last 2 yrs please stop smoking, stop any regular Alcohol  and or any Recreational drug use.  On your next visit with your primary care physician please Get Medicines reviewed and adjusted.  Please request your Prim.MD to go over all Hospital Tests and Procedure/Radiological results at the follow up, please get all Hospital records sent to your Prim MD by signing hospital release before you go home.  If you experience worsening of your admission symptoms, develop shortness of breath, life threatening emergency, suicidal or homicidal thoughts you must seek medical attention immediately by calling 911 or calling your MD immediately  if symptoms less severe.  You Must read complete instructions/literature along with all the possible adverse reactions/side effects for all the Medicines you take and that have been prescribed to you. Take any new Medicines after you have completely understood and accpet all the possible adverse reactions/side effects.       Person Under Monitoring Name: Jennifer Wu  Location: [address] [place] [address]   Infection Prevention Recommendations for Individuals Confirmed to have, or Being Evaluated for, 2019 Novel Coronavirus (COVID-19) Infection Who Receive Care at Home  Individuals who are confirmed to have, or are being evaluated for, COVID-19 should follow the prevention steps below until a healthcare provider or local or state health department says they can return to normal activities.  Stay home except to get medical care You should restrict activities outside your home, except for getting medical  care. Do not go to work, school, or public areas, and do not use public transportation or taxis.  Call ahead before visiting your doctor Before your medical appointment, call the healthcare provider and tell them that you have, or are being evaluated for, COVID-19 infection. This will help the healthcare provider's office take steps to keep other people from getting infected. Ask your healthcare provider to call the local or state health department.  Monitor your symptoms Seek prompt medical attention if your illness is worsening (e.g., difficulty breathing). Before going to your medical appointment, call the healthcare provider and tell them that you have, or are being evaluated for, COVID-19 infection. Ask your healthcare provider to call the local or state health department.  Wear a facemask You should wear a facemask that covers your nose and mouth when you are in the same room with other people and when you visit a healthcare provider. People who live with or visit you should also wear a facemask while they are in the same room with you.  Separate yourself from other people in your home As much as possible, you should stay in a different room from other people in your home. Also, you should use a separate bathroom, if available.  Avoid sharing household items You should not share dishes, drinking glasses, cups, eating utensils, towels, bedding, or other items with other people in your home. After using these items, you should wash them thoroughly with soap and water.  Cover your coughs and sneezes Cover your mouth and nose with a tissue when you cough or sneeze, or you can  cough or sneeze into your sleeve. Throw used tissues in a lined trash can, and immediately wash your hands with soap and water for at least 20 seconds or use an alcohol-based hand rub.  Wash your [facility] your hands often and thoroughly with soap and water for at least 20 seconds. You can use an alcohol-based  hand sanitizer if soap and water are not available and if your hands are not visibly dirty. Avoid touching your eyes, nose, and mouth with unwashed hands.   Prevention Steps for Caregivers and Household Members of Individuals Confirmed to have, or Being Evaluated for, COVID-19 Infection Being Cared for in the Home  If you live with, or provide care at home for, a person confirmed to have, or being evaluated for, COVID-19 infection please follow these guidelines to prevent infection:  Follow healthcare provider's instructions Make sure that you understand and can help the patient follow any healthcare provider instructions for all care.  Provide for the patient's basic needs You should help the patient with basic needs in the home and provide support for getting groceries, prescriptions, and other personal needs.  Monitor the patient's symptoms If they are getting sicker, call his or her medical provider and tell them that the patient has, or is being evaluated for, COVID-19 infection. This will help the healthcare provider's office take steps to keep other people from getting infected. Ask the healthcare provider to call the local or state health department.  Limit the number of people who have contact with the patient  If possible, have only one caregiver for the patient.  Other household members should stay in another home or place of residence. If this is not possible, they should stay  in another room, or be separated from the patient as much as possible. Use a separate bathroom, if available.  Restrict visitors who do not have an essential need to be in the home.  Keep older adults, very young children, and other sick people away from the patient Keep older adults, very young children, and those who have compromised immune systems or chronic health conditions away from the patient. This includes people with chronic heart, lung, or kidney conditions, diabetes, and  cancer.  Ensure good ventilation Make sure that shared spaces in the home have good air flow, such as from an air conditioner or an opened window, weather permitting.  Wash your hands often  Wash your hands often and thoroughly with soap and water for at least 20 seconds. You can use an alcohol based hand sanitizer if soap and water are not available and if your hands are not visibly dirty.  Avoid touching your eyes, nose, and mouth with unwashed hands.  Use disposable paper towels to dry your hands. If not available, use dedicated cloth towels and replace them when they become wet.  Wear a facemask and gloves  Wear a disposable facemask at all times in the room and gloves when you touch or have contact with the patient's blood, body fluids, and/or secretions or excretions, such as sweat, saliva, sputum, nasal mucus, vomit, urine, or feces.  Ensure the mask fits over your nose and mouth tightly, and do not touch it during use.  Throw out disposable facemasks and gloves after using them. Do not reuse.  Wash your hands immediately after removing your facemask and gloves.  If your personal clothing becomes contaminated, carefully remove clothing and launder. Wash your hands after handling contaminated clothing.  Place all used disposable facemasks, gloves, and  other waste in a lined container before disposing them with other household waste.  Remove gloves and wash your hands immediately after handling these items.  Do not share dishes, glasses, or other household items with the patient  Avoid sharing household items. You should not share dishes, drinking glasses, cups, eating utensils, towels, bedding, or other items with a patient who is confirmed to have, or being evaluated for, COVID-19 infection.  After the person uses these items, you should wash them thoroughly with soap and water.  Wash laundry thoroughly  Immediately remove and wash clothes or bedding that have blood, body  fluids, and/or secretions or excretions, such as sweat, saliva, sputum, nasal mucus, vomit, urine, or feces, on them.  Wear gloves when handling laundry from the patient.  Read and follow directions on labels of laundry or clothing items and detergent. In general, wash and dry with the warmest temperatures recommended on the label.  Clean all areas the individual has used often  Clean all touchable surfaces, such as counters, tabletops, doorknobs, bathroom fixtures, toilets, phones, keyboards, tablets, and bedside tables, every day. Also, clean any surfaces that may have blood, body fluids, and/or secretions or excretions on them.  Wear gloves when cleaning surfaces the patient has come in contact with.  Use a diluted bleach solution (e.g., dilute bleach with 1 part bleach and 10 parts water) or a household disinfectant with a label that says EPA-registered for coronaviruses. To make a bleach solution at home, add 1 tablespoon of bleach to 1 quart (4 cups) of water. For a larger supply, add  cup of bleach to 1 gallon (16 cups) of water.  Read labels of cleaning products and follow recommendations provided on product labels. Labels contain instructions for safe and effective use of the cleaning product including precautions you should take when applying the product, such as wearing gloves or eye protection and making sure you have good ventilation during use of the product.  Remove gloves and wash hands immediately after cleaning.  Monitor yourself for signs and symptoms of illness Caregivers and household members are considered close contacts, should monitor their health, and will be asked to limit movement outside of the home to the extent possible. Follow the monitoring steps for close contacts listed on the symptom monitoring form.   ? If you have additional questions, contact your local health department or call the epidemiologist on call at [phone] (available 24/7). ? This  guidance is subject to change. For the most up-to-date guidance from [facility], please refer to their website: YouBlogs.pl

## 2020-05-09 NOTE — Progress Notes (Signed)
VASCULAR LAB    Bilateral lower extremity venous duplex has been performed.  See CV proc for preliminary results.   Creighton Longley, RVT 05/09/2020, 11:38 AM

## 2020-05-10 ENCOUNTER — Telehealth: Payer: Self-pay

## 2020-05-10 MED FILL — LOPERAMIDE 2 MG CAPSULE: 2 | 4 days supply | Qty: 12 | Fill #0

## 2020-05-10 MED FILL — methylPREDNISolone 4 MG dos: 4 | 6 days supply | Qty: 21 | Fill #0

## 2020-05-10 NOTE — Telephone Encounter (Signed)
Transition Care Management Follow-up Telephone Call  Date of discharge and from where: 05/09/2020 from [facility]  How have you been since you were released from the hospital? Pt stated that she is doing fine and has a follow up with PCP this week.   Any questions or concerns? No  Items Reviewed:  Did the pt receive and understand the discharge instructions provided? Yes   Medications obtained and verified? Yes   Other? No   Any new allergies since your discharge? No   Dietary orders reviewed? n/a  Do you have support at home? Yes   Functional Questionnaire: (I = Independent and D = Dependent) ADLs: I  Bathing/Dressing- I  Meal Prep- I - some dependency  Eating- I  Maintaining continence- I  Transferring/Ambulation- I - some dependency  Managing Meds- I  Follow up appointments reviewed:   PCP Hospital f/u appt confirmed? Yes  Scheduled to see Jenna Luo, MD on 05/13/20 @ 12:00pm.  Are transportation arrangements needed? No   If their condition worsens, is the pt aware to call PCP or go to the Emergency Dept.? Yes  Was the patient provided with contact information for the PCP's office or ED? Yes  Was to pt encouraged to call back with questions or concerns? Yes

## 2020-05-12 LAB — CULTURE, BLOOD (ROUTINE X 2)
Culture: NO GROWTH
Culture: NO GROWTH
Special Requests: ADEQUATE
Special Requests: ADEQUATE

## 2020-05-13 ENCOUNTER — Other Ambulatory Visit: Payer: Self-pay

## 2020-05-13 ENCOUNTER — Encounter: Payer: Self-pay | Admitting: Family Medicine

## 2020-05-13 ENCOUNTER — Ambulatory Visit (INDEPENDENT_AMBULATORY_CARE_PROVIDER_SITE_OTHER): Payer: Medicare PPO | Admitting: Family Medicine

## 2020-05-13 VITALS — BP 132/70 | HR 70 | Temp 98.9°F | Resp 14 | Ht 66.0 in | Wt 196.0 lb

## 2020-05-13 DIAGNOSIS — U071 COVID-19: Secondary | ICD-10-CM

## 2020-05-13 NOTE — Progress Notes (Signed)
Subjective:    Patient ID: Jennifer Wu, female    DOB: 01-26-1941, 80 y.o.   MRN: [id number]  Admit date: 05/06/2020  Discharge date: 05/09/2020  Admitted From: Home   Disposition:  Home   Recommendations for Outpatient Follow-up:   Follow up with PCP in 1-2 weeks  PCP Please obtain BMP/CBC, 2 view CXR in 1week,  (see Discharge instructions)   PCP Please follow up on the following pending results:    Home Health: None   Equipment/Devices: None  Consultations: None  Discharge Condition: Stable    CODE STATUS: Full    Diet Recommendation: Heart Healthy      Diet Order                  Diet - low sodium heart healthy            Diet Heart Room service appropriate? Yes; Fluid consistency: Thin  Diet effective now                      Chief Complaint  Patient presents with  . Altered Mental Status     Brief history of present illness from the day of admission and additional interim summary    Patient is a80 y.o.femalewith PMHx of HLD, HTN, DM-2-presented with 4 to 5-day history of fever, diarrhea-upon further evaluation-she was found to have acute gastroenteritis and mild hypoxemia due to COVID-19 infection. See below for further details.  COVID-19 vaccinated status: Vaccinated-second Pfizer vaccine on October 2021  Significant Events: 2/10>> Admit to [facility] for COVID-19 related gastroenteritis and hypoxemia  Significant studies: 2/11>>Chest x-ray: Cardiomegaly/vascular congestion-right hilar fullness  COVID-19 medications: Steroids: 2/10>> Remdesivir: 2/10>>                                                                  Hospital Course   Acute COVID-19 gastroenteritis.  No pulmonary symptoms, mild diarrhea and dehydration, with IV fluids and supportive care much improved, ambulated in the hallway without any distress work with PT, symptom-free eager to go home will be discharged home on as needed Imodium.        Recent  Labs  Lab 05/07/20 0025 05/07/20 0609 05/08/20 0033 05/09/20 0150  WBC 4.9 5.2 5.9 6.3  CRP  --   --  25.4* 12.2*  DDIMER  --   --  4.36* 1.41*  PROCALCITON  --  1.11  --   --   LATICACIDVEN 0.9  --   --   --   AST 16  --  20 25  ALT 12  --  17 23  ALKPHOS 64  --  59 65  BILITOT 1.2  --  0.5 0.4  ALBUMIN 3.0*  --  2.8* 2.7*  INR 1.2  --   --   --   SARSCOV2NAA POSITIVE*  --   --   --        Elevated D-dimer: Due to COVID-19 related inflammation-no hypoxia or chest symptoms, no leg swelling, D-dimer has come down on prophylactic Lovenox, will give a total 10 days of oral Eliquis upon discharge thereafter follow with PCP.   Acute metabolic encephalopathy: Due to hypoxia/COVID-19 infection-resolved-she is completely awake and alert, resolved  Hypokalemia: Placed and stable.  HTN: Stable-continue amlodipine  HLD: Continue statin  DM-2 (A1c 6.8 on 2/11): Follow with PCP for outpatient monitoring and management   Discharge diagnosis     Principal Problem:   Acute respiratory failure due to COVID-19 [facility]) Active Problems:   Essential hypertension   HLD (hyperlipidemia) 05/13/20 Patient is here today for follow-up. She is done remarkably well since discharge from the hospital. She denies any chest pain shortness of breath or dyspnea on exertion. Her diarrhea has subsided. She denies any cough. She is here today with her husband. He denies any delirium or confusion. She is walking with a cane. She is almost completed her Medrol Dosepak. She probably has another 4 days of Eliquis remaining for DVT prophylaxis. She denies any leg swelling or pleurisy or hemoptysis. There is no evidence for DVT on exam. She denies any dysuria or urgency or frequency. Blood pressure today is well controlled at 132/70 Past Medical History:  Diagnosis Date  . Arthritis    knees - otc med prn  . Cancer ([place]) 2000-rt lumpectomy-no bp rt arm  . Closed fracture of left proximal humerus   .  Diabetes mellitus type 2 with complications ([place])   . GERD (gastroesophageal reflux disease)   . High cholesterol   . Mixed dyslipidemia   . 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 Seasonal allergies   . Vitamin D deficiency    Past Surgical History:  Procedure Laterality Date  . BACK SURGERY    . BREAST SURGERY    . COLONOSCOPY    . [place]   MAB  . 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 ELBOW SURGERY    . FRACTURE SURGERY  rt foot-1990  . HYSTEROSCOPY WITH D & C  06/09/2011   Procedure: DILATATION AND CURETTAGE /HYSTEROSCOPY;  Surgeon: Cheri Fowler, MD;  Location: [facility];  Service: Gynecology;  Laterality: N/A;  . KNEE SURGERY  10/12   right knee  . LUMBAR LAMINECTOMY  1991  . LYMPH NODE BIOPSY     right breast  . RADIAL HEAD IMPLANT  02/01/2011   Procedure: RADIAL HEAD IMPLANT;  Surgeon: Schuyler Amor, MD;  Location: [place];  Service: Orthopedics;  Laterality: Left;  left radial head replacement  . SVD     x 3   Current Outpatient Medications on File Prior to Visit  Medication Sig Dispense Refill  . albuterol (VENTOLIN HFA) 108 (90 Base) MCG/ACT inhaler Inhale 2 puffs into the lungs every 6 (six) hours as needed for wheezing or shortness of breath. 6.7 g 0  . amLODipine (NORVASC) 5 MG tablet TAKE 1 TABLET BY MOUTH DAILY. (Patient taking differently: Take 5 mg by mouth daily.) 90 tablet 1  . apixaban (ELIQUIS) 2.5 MG TABS tablet Take 1 tablet (2.5 mg total) by mouth 2 (two) times daily. 20 tablet 0  . atorvastatin (LIPITOR) 20 MG tablet TAKE 1 TABLET BY MOUTH DAILY. (Patient taking differently: Take 20 mg by mouth daily.) 90 tablet 1  . chlorpheniramine (CHLOR-TRIMETON) 4 MG tablet Take 4 mg by mouth every 4 (four) hours as needed for allergies.    . cyclobenzaprine (FLEXERIL) 5 MG tablet Take 1 tablet  (5 mg total) by mouth 3 (three) times daily as needed for muscle spasms. 90 tablet 1  . famotidine (PEPCID) 40 MG tablet Take 1  tablet (40 mg total) by mouth daily. 90 tablet 3  . furosemide (LASIX) 20 MG tablet TAKE 1 TABLET (20 MG TOTAL) BY MOUTH DAILY. STOP HYDROCHLOROTHIAZIDE 30 tablet 2  . gabapentin (NEURONTIN) 300 MG capsule TAKE 1 CAPSULE BY MOUTH THREE TIMES DAILY. (Patient taking differently: Take 300 mg by mouth 3 (three) times daily.) 90 capsule 2  . loperamide (IMODIUM) 2 MG capsule Take 1 capsule (2 mg total) by mouth every 8 (eight) hours as needed for diarrhea or loose stools. 12 capsule 0  . methylPREDNISolone (MEDROL DOSEPAK) 4 MG TBPK tablet follow package directions 21 tablet 0  . montelukast (SINGULAIR) 10 MG tablet TAKE 1 TABLET BY MOUTH AT BEDTIME. (Patient taking differently: Take 10 mg by mouth at bedtime.) 30 tablet 11  . pantoprazole (PROTONIX) 40 MG tablet TAKE 1 TABLET BY MOUTH TWO TIMES DAILY (Patient taking differently: Take 40 mg by mouth 2 (two) times daily.) 60 tablet 2  . VITAMIN D PO Take 1 capsule by mouth daily.     No current facility-administered medications on file prior to visit.   Allergies  Allergen Reactions  . Lisinopril Nausea And Vomiting   Social History   Socioeconomic History  . Marital status: Married    Spouse name: Not on file  . Number of children: Not on file  . Years of education: Not on file  . Highest education level: Not on file  Occupational History  . Not on file  Tobacco Use  . Smoking status: Never Smoker  . Smokeless tobacco: Never Used  Substance and Sexual Activity  . Alcohol use: No  . Drug use: No  . Sexual activity: Not on file  Other Topics Concern  . Not on file  Social History Narrative  . Not on file   Social Determinants of Health   Financial Resource Strain: Not on file  Food Insecurity: Not on file  Transportation Needs: Not on file  Physical Activity: Not on file  Stress: Not on file  Social  Connections: Not on file  Intimate Partner Violence: Not on file      Review of Systems  All other systems reviewed and are negative.      Objective:   Physical Exam Vitals reviewed.  Constitutional:      General: She is not in acute distress.    Appearance: She is well-developed. She is not diaphoretic.  Neck:     Thyroid: No thyromegaly.     Vascular: No JVD.  Cardiovascular:     Rate and Rhythm: Normal rate and regular rhythm.     Heart sounds: Murmur heard.    Pulmonary:     Effort: Pulmonary effort is normal. No respiratory distress.     Breath sounds: Normal breath sounds. No wheezing or rales.  Abdominal:     General: Bowel sounds are normal. There is no distension.     Palpations: Abdomen is soft.     Tenderness: There is no abdominal tenderness. There is no rebound.           Assessment & Plan:   COVID-19 - Plan: CBC with Differential/Platelet, COMPLETE METABOLIC PANEL WITH GFR  Clinically the patient has done extremely well bouncing back from Covid. Her lungs sound normal on exam with no evidence of crackles or rails or wheezing. Therefore I suggested that she complete the Medrol Dosepak as prescribed I do not feel that she requires long-term steroids. She is also ambulating with a cane. Complete Eliquis as prescribed and then  no further anticoagulant will be required. Check baseline lab values today. Patient will need to follow-up in 3 months to recheck A1c as her A1c in the hospital was 6.8

## 2020-05-14 LAB — COMPLETE METABOLIC PANEL WITH GFR
AG Ratio: 1.5 (calc) (ref 1.0–2.5)
ALT: 19 U/L (ref 6–29)
AST: 11 U/L (ref 10–35)
Albumin: 3.9 g/dL (ref 3.6–5.1)
Alkaline phosphatase (APISO): 79 U/L (ref 37–153)
BUN: 19 mg/dL (ref 7–25)
CO2: 28 mmol/L (ref 20–32)
Calcium: 9.6 mg/dL (ref 8.6–10.4)
Chloride: 97 mmol/L — ABNORMAL LOW (ref 98–110)
Creat: 0.73 mg/dL (ref 0.60–0.93)
GFR, Est African American: 91 mL/min/{1.73_m2} (ref >=60)
GFR, Est Non African American: 79 mL/min/{1.73_m2} (ref >=60)
Globulin: 2.6 g/dL (calc) (ref 1.9–3.7)
Glucose, Bld: 178 mg/dL — ABNORMAL HIGH (ref 65–99)
Potassium: 4.4 mmol/L (ref 3.5–5.3)
Sodium: 135 mmol/L (ref 135–146)
Total Bilirubin: 0.4 mg/dL (ref 0.2–1.2)
Total Protein: 6.5 g/dL (ref 6.1–8.1)

## 2020-05-14 LAB — CBC WITH DIFFERENTIAL/PLATELET
Absolute Monocytes: 972 cells/uL — ABNORMAL HIGH (ref 200–950)
Basophils Absolute: 81 cells/uL (ref 0–200)
Basophils Relative: 0.6 %
Eosinophils Absolute: 81 cells/uL (ref 15–500)
Eosinophils Relative: 0.6 %
HCT: 40.8 % (ref 35.0–45.0)
Hemoglobin: 13.3 g/dL (ref 11.7–15.5)
Lymphs Abs: 2336 cells/uL (ref 850–3900)
MCH: 27.8 pg (ref 27.0–33.0)
MCHC: 32.6 g/dL (ref 32.0–36.0)
MCV: 85.4 fL (ref 80.0–100.0)
MPV: 10.6 fL (ref 7.5–12.5)
Monocytes Relative: 7.2 %
Neutro Abs: 10031 cells/uL — ABNORMAL HIGH (ref 1500–7800)
Neutrophils Relative %: 74.3 %
Platelets: 389 10*3/uL (ref 140–400)
RBC: 4.78 10*6/uL (ref 3.80–5.10)
RDW: 12.9 % (ref 11.0–15.0)
Total Lymphocyte: 17.3 %
WBC: 13.5 10*3/uL — ABNORMAL HIGH (ref 3.8–10.8)

## 2020-05-17 ENCOUNTER — Other Ambulatory Visit: Payer: Self-pay | Admitting: *Deleted

## 2020-05-17 MED ORDER — METFORMIN HCL 500 MG PO TABS
500.0000 mg | ORAL_TABLET | Freq: Two times a day (BID) | ORAL | 0 refills | Status: DC
Start: 2020-05-17 — End: 2020-05-17

## 2020-05-17 MED FILL — METFORMIN HCL 500 MG TABS: 500 | 90 days supply | Qty: 180 | Fill #0

## 2020-05-19 ENCOUNTER — Other Ambulatory Visit: Payer: Self-pay | Admitting: Family Medicine

## 2020-05-19 DIAGNOSIS — E782 Mixed hyperlipidemia: Secondary | ICD-10-CM

## 2020-05-19 MED FILL — GABAPENTIN 300 MG CAPSULE: 300 | 30 days supply | Qty: 90 | Fill #1

## 2020-05-19 MED FILL — MONTELUKAST SOD 10 MG TAB: 10 | 30 days supply | Qty: 30 | Fill #2

## 2020-05-19 MED FILL — PANTOPRAZOLE SOD DR 40 MG T: 40 | 30 days supply | Qty: 60 | Fill #2

## 2020-05-19 MED FILL — FAMOTIDINE 40 MG TABLET: 40 | 90 days supply | Qty: 90 | Fill #3

## 2020-05-19 MED FILL — AMLODIPINE BESYLATE 5 MG TA: 5 | 90 days supply | Qty: 90 | Fill #1

## 2020-05-19 MED FILL — FUROSEMIDE 20 MG TABS: 20 | 30 days supply | Qty: 30 | Fill #1

## 2020-06-18 ENCOUNTER — Other Ambulatory Visit: Payer: Self-pay | Admitting: Family Medicine

## 2020-06-18 DIAGNOSIS — R053 Chronic cough: Secondary | ICD-10-CM

## 2020-06-18 MED FILL — PANTOPRAZOLE SOD DR 40 MG T: 40 | 30 days supply | Qty: 60 | Fill #0

## 2020-06-18 MED FILL — MONTELUKAST SOD 10 MG TAB: 10 | 30 days supply | Qty: 30 | Fill #3

## 2020-06-18 MED FILL — ATORVASTATIN CALCIUM 20 MG: 20 | 90 days supply | Qty: 90 | Fill #0

## 2020-06-18 MED FILL — FUROSEMIDE 20 MG TABS: 20 | 30 days supply | Qty: 30 | Fill #2

## 2020-06-18 MED FILL — GABAPENTIN 300 MG CAPSULE: 300 | 30 days supply | Qty: 90 | Fill #2

## 2020-06-26 ENCOUNTER — Other Ambulatory Visit (HOSPITAL_COMMUNITY): Payer: Self-pay

## 2020-07-07 ENCOUNTER — Other Ambulatory Visit (HOSPITAL_COMMUNITY): Payer: Self-pay

## 2020-07-07 MED ORDER — CIPROFLOXACIN HCL 250 MG PO TABS
250.0000 mg | ORAL_TABLET | Freq: Two times a day (BID) | ORAL | 0 refills | Status: DC
  Filled 2020-07-07: qty 6, 3d supply, fill #0

## 2020-07-08 ENCOUNTER — Other Ambulatory Visit (HOSPITAL_COMMUNITY): Payer: Self-pay

## 2020-08-01 ENCOUNTER — Other Ambulatory Visit: Payer: Self-pay | Admitting: Family Medicine

## 2020-08-01 ENCOUNTER — Other Ambulatory Visit: Payer: Self-pay | Admitting: Internal Medicine

## 2020-08-01 MED FILL — Pantoprazole Sodium EC Tab 40 MG (Base Equiv): ORAL | 30 days supply | Qty: 60 | Fill #0 | Status: AC

## 2020-08-01 MED FILL — Montelukast Sodium Tab 10 MG (Base Equiv): ORAL | 30 days supply | Qty: 30 | Fill #0 | Status: AC

## 2020-08-02 ENCOUNTER — Other Ambulatory Visit (HOSPITAL_COMMUNITY): Payer: Self-pay

## 2020-08-02 MED ORDER — AMLODIPINE BESYLATE 5 MG PO TABS
5.0000 mg | ORAL_TABLET | Freq: Every day | ORAL | 1 refills | Status: DC
  Filled 2020-08-02: qty 90, 90d supply, fill #0

## 2020-08-02 MED ORDER — METFORMIN HCL 500 MG PO TABS
500.0000 mg | ORAL_TABLET | Freq: Two times a day (BID) | ORAL | 0 refills | Status: DC
  Filled 2020-08-02: qty 180, 90d supply, fill #0

## 2020-08-02 MED ORDER — FUROSEMIDE 20 MG PO TABS
20.0000 mg | ORAL_TABLET | Freq: Every day | ORAL | 2 refills | Status: DC
  Filled 2020-08-02: qty 30, 30d supply, fill #0
  Filled 2020-09-01: qty 30, 30d supply, fill #1
  Filled 2020-10-06: qty 30, 30d supply, fill #2

## 2020-08-02 MED ORDER — GABAPENTIN 300 MG PO CAPS
300.0000 mg | ORAL_CAPSULE | Freq: Three times a day (TID) | ORAL | 0 refills | Status: DC
  Filled 2020-08-02: qty 90, 30d supply, fill #0

## 2020-08-02 MED ORDER — FAMOTIDINE 40 MG PO TABS
40.0000 mg | ORAL_TABLET | Freq: Every day | ORAL | 3 refills | Status: DC
  Filled 2020-08-02: qty 30, 30d supply, fill #0
  Filled 2020-09-01: qty 30, 30d supply, fill #1
  Filled 2020-10-06: qty 30, 30d supply, fill #2
  Filled 2020-11-15: qty 30, 30d supply, fill #3
  Filled 2021-02-22: qty 30, 30d supply, fill #4
  Filled 2021-04-06: qty 30, 30d supply, fill #5
  Filled 2021-06-13: qty 30, 30d supply, fill #6
  Filled 2021-07-20: qty 30, 30d supply, fill #7

## 2020-08-05 ENCOUNTER — Other Ambulatory Visit (HOSPITAL_COMMUNITY): Payer: Self-pay

## 2020-08-10 ENCOUNTER — Other Ambulatory Visit (HOSPITAL_COMMUNITY): Payer: Self-pay

## 2020-08-10 ENCOUNTER — Other Ambulatory Visit: Payer: Self-pay | Admitting: *Deleted

## 2020-08-10 ENCOUNTER — Ambulatory Visit (INDEPENDENT_AMBULATORY_CARE_PROVIDER_SITE_OTHER): Payer: Medicare PPO | Admitting: Family Medicine

## 2020-08-10 ENCOUNTER — Other Ambulatory Visit: Payer: Self-pay

## 2020-08-10 ENCOUNTER — Encounter: Payer: Self-pay | Admitting: Family Medicine

## 2020-08-10 VITALS — BP 136/74 | HR 88 | Temp 98.4°F | Resp 14 | Ht 66.0 in | Wt 204.0 lb

## 2020-08-10 DIAGNOSIS — E785 Hyperlipidemia, unspecified: Secondary | ICD-10-CM

## 2020-08-10 DIAGNOSIS — I10 Essential (primary) hypertension: Secondary | ICD-10-CM | POA: Diagnosis not present

## 2020-08-10 DIAGNOSIS — E118 Type 2 diabetes mellitus with unspecified complications: Secondary | ICD-10-CM | POA: Diagnosis not present

## 2020-08-10 DIAGNOSIS — Z23 Encounter for immunization: Secondary | ICD-10-CM | POA: Diagnosis not present

## 2020-08-10 MED ORDER — VALSARTAN 160 MG PO TABS
160.0000 mg | ORAL_TABLET | Freq: Every day | ORAL | 3 refills | Status: DC
  Filled 2020-08-10: qty 90, 90d supply, fill #0

## 2020-08-10 MED ORDER — CYCLOBENZAPRINE HCL 5 MG PO TABS
5.0000 mg | ORAL_TABLET | Freq: Three times a day (TID) | ORAL | 1 refills | Status: DC | PRN
  Filled 2020-08-10: qty 90, 30d supply, fill #0
  Filled 2020-09-01: qty 90, 30d supply, fill #1

## 2020-08-10 NOTE — Telephone Encounter (Signed)
Received call from patient.   Requested refill on Flexeril.   Ok to refill?? 

## 2020-08-10 NOTE — Progress Notes (Signed)
Subjective:    Patient ID: Jennifer Wu, female    DOB: 12-31-1940, 80 y.o.   MRN: [id number]   Patient is here today for a follow-up on her diabetes.  Of note she is on amlodipine.  However her vascular specialist has her on Lasix due to leg swelling.  She is not on an ACE inhibitor due to her chronic cough.  However she has never been tried on an angiotensin receptor blocker either.  She denies any polyuria, polydipsia, blurry vision.  She denies any chest pain shortness of breath or dyspnea on exertion.  She denies any neuropathy in her feet.  She is not currently taking a statin. Past Medical History:  Diagnosis Date  . Arthritis    knees - otc med prn  . Cancer ([place]) 2000-rt lumpectomy-no bp rt arm  . Closed fracture of left proximal humerus   . Diabetes mellitus type 2 with complications ([place])   . GERD (gastroesophageal reflux disease)   . High cholesterol   . Mixed dyslipidemia   . 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 Seasonal allergies   . Vitamin D deficiency    Past Surgical History:  Procedure Laterality Date  . BACK SURGERY    . BREAST SURGERY    . COLONOSCOPY    . [place]   MAB  . 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 ELBOW SURGERY    . FRACTURE SURGERY  rt foot-1990  . HYSTEROSCOPY WITH D & C  06/09/2011   Procedure: DILATATION AND CURETTAGE /HYSTEROSCOPY;  Surgeon: Cheri Fowler, MD;  Location: [facility];  Service: Gynecology;  Laterality: N/A;  . KNEE SURGERY  10/12   right knee  . LUMBAR LAMINECTOMY  1991  . LYMPH NODE BIOPSY     right breast  . RADIAL HEAD IMPLANT  02/01/2011   Procedure: RADIAL HEAD IMPLANT;  Surgeon: Schuyler Amor, MD;  Location: [place];  Service: Orthopedics;  Laterality: Left;  left radial head replacement  . SVD     x 3   Current Outpatient Medications on  File Prior to Visit  Medication Sig Dispense Refill  . albuterol (VENTOLIN HFA) 108 (90 Base) MCG/ACT inhaler Inhale 2 puffs into the lungs every 6 (six) hours as needed for wheezing or shortness of breath. 6.7 g 0  . amLODipine (NORVASC) 5 MG tablet TAKE 1 TABLET BY MOUTH DAILY. 90 tablet 1  . apixaban (ELIQUIS) 2.5 MG TABS tablet Take 1 tablet (2.5 mg total) by mouth 2 (two) times daily. 20 tablet 0  . atorvastatin (LIPITOR) 20 MG tablet TAKE 1 TABLET BY MOUTH DAILY. 90 tablet 1  . chlorpheniramine (CHLOR-TRIMETON) 4 MG tablet Take 4 mg by mouth every 4 (four) hours as needed for allergies.    . ciprofloxacin (CIPRO) 250 MG tablet Take 1 tablet (250 mg total) by mouth 2 (two) times daily. 6 tablet 0  . cyclobenzaprine (FLEXERIL) 5 MG tablet Take 1 tablet (5 mg total) by mouth 3 (three) times daily as needed for muscle spasms. 90 tablet 1  . famotidine (PEPCID) 40 MG tablet TAKE 1 TABLET (40 MG TOTAL) BY MOUTH DAILY. 90 tablet 3  . furosemide (LASIX) 20 MG tablet TAKE 1 TABLET (20 MG TOTAL) BY MOUTH DAILY. STOP HYDROCHLOROTHIAZIDE 30 tablet 2  . gabapentin (NEURONTIN) 300 MG capsule  TAKE 1 CAPSULE BY MOUTH THREE TIMES DAILY. 90 capsule 0  . loperamide (IMODIUM) 2 MG capsule TAKE 1 CAPSULE BY MOUTH EVERY 8 HOURS AS NEEDED FOR DIARRHEA OR LOOSE STOOLS 12 capsule 0  . metFORMIN (GLUCOPHAGE) 500 MG tablet TAKE 1 TABLET (500 MG TOTAL) BY MOUTH 2 (TWO) TIMES DAILY WITH A MEAL. 180 tablet 0  . methylPREDNISolone (MEDROL DOSEPAK) 4 MG TBPK tablet FOLLOW PATIENT INSTRUCTIONS ON PACKAGE 21 each 0  . montelukast (SINGULAIR) 10 MG tablet TAKE 1 TABLET BY MOUTH AT BEDTIME. (Patient taking differently: Take 10 mg by mouth at bedtime.) 30 tablet 11  . Na Sulfate-K Sulfate-Mg Sulf 17.5-3.13-1.6 GM/177ML SOLN TAKE AS DIRECTED 354 mL 0  . pantoprazole (PROTONIX) 40 MG tablet TAKE 1 TABLET BY MOUTH TWO TIMES DAILY 60 tablet 2  . VITAMIN D PO Take 1 capsule by mouth daily.    . [DISCONTINUED] hydrochlorothiazide  (HYDRODIURIL) 25 MG tablet TAKE 1 TABLET BY MOUTH DAILY. 90 tablet 1   No current facility-administered medications on file prior to visit.   Allergies  Allergen Reactions  . Lisinopril Nausea And Vomiting   Social History   Socioeconomic History  . Marital status: Married    Spouse name: Not on file  . Number of children: Not on file  . Years of education: Not on file  . Highest education level: Not on file  Occupational History  . Not on file  Tobacco Use  . Smoking status: Never Smoker  . Smokeless tobacco: Never Used  Substance and Sexual Activity  . Alcohol use: No  . Drug use: No  . Sexual activity: Not on file  Other Topics Concern  . Not on file  Social History Narrative  . Not on file   Social Determinants of Health   Financial Resource Strain: Not on file  Food Insecurity: Not on file  Transportation Needs: Not on file  Physical Activity: Not on file  Stress: Not on file  Social Connections: Not on file  Intimate Partner Violence: Not on file      Review of Systems  All other systems reviewed and are negative.      Objective:   Physical Exam Vitals reviewed.  Constitutional:      General: She is not in acute distress.    Appearance: She is well-developed. She is not diaphoretic.  Neck:     Thyroid: No thyromegaly.     Vascular: No JVD.  Cardiovascular:     Rate and Rhythm: Normal rate and regular rhythm.     Heart sounds: Murmur heard.    Pulmonary:     Effort: Pulmonary effort is normal. No respiratory distress.     Breath sounds: Normal breath sounds. No wheezing or rales.  Abdominal:     General: Bowel sounds are normal. There is no distension.     Palpations: Abdomen is soft.     Tenderness: There is no abdominal tenderness. There is no rebound.           Assessment & Plan:  Benign essential HTN  Hyperlipidemia, unspecified hyperlipidemia type  Diabetes mellitus type 2 with complications ([place]) - Plan: CBC with  Differential/Platelet, COMPLETE METABOLIC PANEL WITH GFR, Lipid panel, Microalbumin, urine, Hemoglobin A1c  Need for prophylactic vaccination against Streptococcus pneumoniae (pneumococcus) - Plan: Pneumococcal conjugate vaccine 20-valent  I recommended that we stop amlodipine and switch to valsartan 160 mg daily for hypertension.  I believe that this will do just as good a job as the amlodipine  if controlling her blood pressure.  However we will add the additional benefit of renal protection from the diabetes and hopefully not contribute to any peripheral edema so that she can reduce her use of Lasix.  Check a hemoglobin A1c.  Goal hemoglobin A1c is less than 6.5.  Check a fasting lipid panel.  Goal LDL cholesterol is less than 100.  If greater than 100 I would recommend statin.  Patient received Prevnar 20 today.

## 2020-08-11 LAB — COMPLETE METABOLIC PANEL WITH GFR
AG Ratio: 1.7 (calc) (ref 1.0–2.5)
ALT: 12 U/L (ref 6–29)
AST: 14 U/L (ref 10–35)
Albumin: 4.3 g/dL (ref 3.6–5.1)
Alkaline phosphatase (APISO): 81 U/L (ref 37–153)
BUN: 15 mg/dL (ref 7–25)
CO2: 32 mmol/L (ref 20–32)
Calcium: 9.8 mg/dL (ref 8.6–10.4)
Chloride: 99 mmol/L (ref 98–110)
Creat: 0.75 mg/dL (ref 0.60–0.93)
GFR, Est African American: 88 mL/min/{1.73_m2} (ref >=60)
GFR, Est Non African American: 76 mL/min/{1.73_m2} (ref >=60)
Globulin: 2.5 g/dL (calc) (ref 1.9–3.7)
Glucose, Bld: 129 mg/dL — ABNORMAL HIGH (ref 65–99)
Potassium: 3.2 mmol/L — ABNORMAL LOW (ref 3.5–5.3)
Sodium: 142 mmol/L (ref 135–146)
Total Bilirubin: 0.5 mg/dL (ref 0.2–1.2)
Total Protein: 6.8 g/dL (ref 6.1–8.1)

## 2020-08-11 LAB — CBC WITH DIFFERENTIAL/PLATELET
Absolute Monocytes: 559 cells/uL (ref 200–950)
Basophils Absolute: 62 cells/uL (ref 0–200)
Basophils Relative: 0.9 %
Eosinophils Absolute: 628 cells/uL — ABNORMAL HIGH (ref 15–500)
Eosinophils Relative: 9.1 %
HCT: 39.9 % (ref 35.0–45.0)
Hemoglobin: 13.1 g/dL (ref 11.7–15.5)
Lymphs Abs: 2070 cells/uL (ref 850–3900)
MCH: 28.1 pg (ref 27.0–33.0)
MCHC: 32.8 g/dL (ref 32.0–36.0)
MCV: 85.4 fL (ref 80.0–100.0)
MPV: 11.2 fL (ref 7.5–12.5)
Monocytes Relative: 8.1 %
Neutro Abs: 3581 cells/uL (ref 1500–7800)
Neutrophils Relative %: 51.9 %
Platelets: 239 10*3/uL (ref 140–400)
RBC: 4.67 10*6/uL (ref 3.80–5.10)
RDW: 13.1 % (ref 11.0–15.0)
Total Lymphocyte: 30 %
WBC: 6.9 10*3/uL (ref 3.8–10.8)

## 2020-08-11 LAB — MICROALBUMIN, URINE: Microalb, Ur: 0.4 mg/dL

## 2020-08-11 LAB — HEMOGLOBIN A1C
Hgb A1c MFr Bld: 6.5 % of total Hgb — ABNORMAL HIGH (ref <5.7)
Mean Plasma Glucose: 140 mg/dL
eAG (mmol/L): 7.7 mmol/L

## 2020-08-11 LAB — LIPID PANEL
Cholesterol: 157 mg/dL (ref <200)
HDL: 39 mg/dL — ABNORMAL LOW (ref >=50)
LDL Cholesterol (Calc): 90 mg/dL (calc)
Non-HDL Cholesterol (Calc): 118 mg/dL (calc) (ref <130)
Total CHOL/HDL Ratio: 4 (calc) (ref <5.0)
Triglycerides: 183 mg/dL — ABNORMAL HIGH (ref <150)

## 2020-08-13 ENCOUNTER — Other Ambulatory Visit: Payer: Self-pay

## 2020-08-13 ENCOUNTER — Other Ambulatory Visit (HOSPITAL_COMMUNITY): Payer: Self-pay

## 2020-08-13 DIAGNOSIS — E876 Hypokalemia: Secondary | ICD-10-CM

## 2020-08-13 MED ORDER — POTASSIUM CHLORIDE CRYS ER 20 MEQ PO TBCR
20.0000 meq | EXTENDED_RELEASE_TABLET | Freq: Every day | ORAL | 0 refills | Status: DC
  Filled 2020-08-13: qty 30, 30d supply, fill #0

## 2020-08-15 ENCOUNTER — Encounter (HOSPITAL_BASED_OUTPATIENT_CLINIC_OR_DEPARTMENT_OTHER): Payer: Self-pay | Admitting: Obstetrics and Gynecology

## 2020-08-15 ENCOUNTER — Other Ambulatory Visit: Payer: Self-pay

## 2020-08-15 ENCOUNTER — Emergency Department (HOSPITAL_BASED_OUTPATIENT_CLINIC_OR_DEPARTMENT_OTHER)
Admission: EM | Admit: 2020-08-15 | Discharge: 2020-08-16 | Disposition: A | Discharge status: Home / Self Care | Payer: Medicare PPO | Attending: Emergency Medicine | Admitting: Emergency Medicine

## 2020-08-15 DIAGNOSIS — J45901 Unspecified asthma with (acute) exacerbation: Secondary | ICD-10-CM | POA: Diagnosis not present

## 2020-08-15 DIAGNOSIS — M25571 Pain in right ankle and joints of right foot: Secondary | ICD-10-CM | POA: Diagnosis not present

## 2020-08-15 DIAGNOSIS — Z79899 Other long term (current) drug therapy: Secondary | ICD-10-CM | POA: Diagnosis not present

## 2020-08-15 DIAGNOSIS — I10 Essential (primary) hypertension: Secondary | ICD-10-CM | POA: Insufficient documentation

## 2020-08-15 DIAGNOSIS — M25572 Pain in left ankle and joints of left foot: Secondary | ICD-10-CM | POA: Insufficient documentation

## 2020-08-15 DIAGNOSIS — Z859 Personal history of malignant neoplasm, unspecified: Secondary | ICD-10-CM | POA: Diagnosis not present

## 2020-08-15 DIAGNOSIS — E119 Type 2 diabetes mellitus without complications: Secondary | ICD-10-CM | POA: Insufficient documentation

## 2020-08-15 DIAGNOSIS — Z8616 Personal history of COVID-19: Secondary | ICD-10-CM | POA: Insufficient documentation

## 2020-08-15 DIAGNOSIS — Z7984 Long term (current) use of oral hypoglycemic drugs: Secondary | ICD-10-CM | POA: Diagnosis not present

## 2020-08-15 DIAGNOSIS — M25472 Effusion, left ankle: Secondary | ICD-10-CM | POA: Insufficient documentation

## 2020-08-15 MED ORDER — HYDROCODONE-ACETAMINOPHEN 5-325 MG PO TABS
1.0000 | ORAL_TABLET | Freq: Once | ORAL | Status: AC
  Administered 2020-08-15: 1 via ORAL
  Filled 2020-08-15: qty 1

## 2020-08-15 NOTE — ED Triage Notes (Signed)
Patient reports she saw her PCP on Monday and had her medications changed. Patient reports she was changed from a calcium channel blocker to a Valsartan. Patient reports she has pain in her left ankle

## 2020-08-16 ENCOUNTER — Ambulatory Visit (HOSPITAL_BASED_OUTPATIENT_CLINIC_OR_DEPARTMENT_OTHER)
Admission: RE | Admit: 2020-08-16 | Discharge: 2020-08-16 | Disposition: A | Discharge status: Home / Self Care | Payer: Medicare PPO | Source: Ambulatory Visit | Attending: Emergency Medicine | Admitting: Emergency Medicine

## 2020-08-16 ENCOUNTER — Telehealth: Payer: Self-pay | Admitting: *Deleted

## 2020-08-16 ENCOUNTER — Other Ambulatory Visit (HOSPITAL_COMMUNITY): Payer: Self-pay

## 2020-08-16 DIAGNOSIS — M25572 Pain in left ankle and joints of left foot: Secondary | ICD-10-CM

## 2020-08-16 DIAGNOSIS — M25472 Effusion, left ankle: Secondary | ICD-10-CM | POA: Insufficient documentation

## 2020-08-16 NOTE — Telephone Encounter (Signed)
Call placed to patient and patient made aware.  

## 2020-08-16 NOTE — Discharge Instructions (Addendum)
You were seen today for ankle pain.  Unclear the etiology of your pain although I do not feel that x-rays would be helpful at this time given where your pain is in the description of pain.  Could be related to some swelling or inflammation of the Achilles.  Keep iced and elevated.  Take ibuprofen 400 mg every 6 hours for the next 2 to 3 days to see if this helps.  Follow-up closely with your primary physician.  Return for DVT study Monday.

## 2020-08-16 NOTE — ED Provider Notes (Signed)
[facility] EMERGENCY DEPT Provider Note   CSN: [id number] Arrival date & time: 08/15/20  2223     History Chief Complaint  Patient presents with  . Ankle Pain    Jennifer BEYERSDORF is a 80 y.o. female.  HPI     This is a 80 year old female with history of diabetes, arthritis, hypertension who presents with left greater than right ankle pain.  Patient reports onset of symptoms this week.  She states she has had difficulty getting comfortable and has noted some slight swelling and pain in her bilateral ankles.  She states "I think it is my Achilles."  She states pain in her right ankle improved but has had persistent worsening pain in the left ankle.  She states it hurts when she props her ankle up on anything or the back of her leg hits anything.  She denies any trauma or falls.  She states the pain is mostly posterior and radiates into her calf.  Daughter states that they started walking again on Monday and she immediately was complaining of pain.  She is also recently had some medication changes and was switched from Norvasc to valsartan.  Rates her pain at 5 out of 10.  She took some ibuprofen with minimal relief.  She is not currently on any antibiotics specifically ciprofloxacin.   Past Medical History:  Diagnosis Date  . Arthritis    knees - otc med prn  . Cancer ([place]) 2000-rt lumpectomy-no bp rt arm  . Closed fracture of left proximal humerus   . Diabetes mellitus type 2 with complications ([place])   . GERD (gastroesophageal reflux disease)   . High cholesterol   . Mixed dyslipidemia   . 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 Seasonal allergies   . Vitamin D deficiency     Patient Active Problem List   Diagnosis Date Noted  . Acute respiratory failure due to COVID-19 ([place]) 05/07/2020  . Essential hypertension 05/07/2020  . HLD (hyperlipidemia) 05/07/2020  . Diabetes  mellitus type 2 with complications ([place])   . Closed fracture of left proximal humerus   . Cough variant asthma vs UACS 04/30/2018  . Asthma with acute exacerbation 07/24/2012  . Vitamin D deficiency   . Seasonal allergies 07/19/2012  . Mixed dyslipidemia     Past Surgical History:  Procedure Laterality Date  . BACK SURGERY    . BREAST SURGERY    . COLONOSCOPY    . [place]   MAB  . 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 ELBOW SURGERY    . FRACTURE SURGERY  rt foot-1990  . HYSTEROSCOPY WITH D & C  06/09/2011   Procedure: DILATATION AND CURETTAGE /HYSTEROSCOPY;  Surgeon: Cheri Fowler, MD;  Location: [facility];  Service: Gynecology;  Laterality: N/A;  . KNEE SURGERY  10/12   right knee  . LUMBAR LAMINECTOMY  1991  . LYMPH NODE BIOPSY     right breast  . RADIAL HEAD IMPLANT  02/01/2011   Procedure: RADIAL HEAD IMPLANT;  Surgeon: Schuyler Amor, MD;  Location: [facility];  Service: Orthopedics;  Laterality: Left;  left radial head replacement  . SVD     x 3     OB History   No obstetric history on file.     Family History  Problem Relation Age of  Onset  . Heart disease Mother        MI's  . CAD Mother   . Diabetes Mother   . Alzheimer's disease Father   . Alcohol abuse Father        cirrhosis  . CAD Brother   . Diabetes Brother   . Heart disease Brother        MI's    Social History   Tobacco Use  . Smoking status: Never Smoker  . Smokeless tobacco: Never Used  Vaping Use  . Vaping Use: Never used  Substance Use Topics  . Alcohol use: No  . Drug use: No    Home Medications Prior to Admission medications   Medication Sig Start Date End Date Taking? Authorizing Provider  albuterol (VENTOLIN HFA) 108 (90 Base) MCG/ACT inhaler Inhale 2 puffs into the lungs every 6 (six) hours as needed for wheezing or shortness of breath. 05/09/20   Thurnell Lose, MD  atorvastatin (LIPITOR) 20 MG  tablet TAKE 1 TABLET BY MOUTH DAILY. 05/19/20 05/19/21  [name], MD  cyclobenzaprine (FLEXERIL) 5 MG tablet Take 1 tablet (5 mg total) by mouth 3 (three) times daily as needed for muscle spasms. 08/10/20   [name], MD  famotidine (PEPCID) 40 MG tablet TAKE 1 TABLET (40 MG TOTAL) BY MOUTH DAILY. 08/02/20   [name], MD  furosemide (LASIX) 20 MG tablet TAKE 1 TABLET (20 MG TOTAL) BY MOUTH DAILY. STOP HYDROCHLOROTHIAZIDE 08/02/20   [name], MD  gabapentin (NEURONTIN) 300 MG capsule TAKE 1 CAPSULE BY MOUTH THREE TIMES DAILY. 08/02/20   Tanda Rockers, MD  loperamide (IMODIUM) 2 MG capsule TAKE 1 CAPSULE BY MOUTH EVERY 8 HOURS AS NEEDED FOR DIARRHEA OR LOOSE STOOLS Patient not taking: Reported on 08/10/2020 05/09/20 05/09/21  Thurnell Lose, MD  metFORMIN (GLUCOPHAGE) 500 MG tablet TAKE 1 TABLET (500 MG TOTAL) BY MOUTH 2 (TWO) TIMES DAILY WITH A MEAL. 08/02/20   [name], MD  montelukast (SINGULAIR) 10 MG tablet TAKE 1 TABLET BY MOUTH AT BEDTIME. Patient taking differently: Take 10 mg by mouth at bedtime. 02/26/20 02/25/21  Tanda Rockers, MD  pantoprazole (PROTONIX) 40 MG tablet TAKE 1 TABLET BY MOUTH TWO TIMES DAILY 06/18/20 06/18/21  [name], MD  potassium chloride SA (KLOR-CON) 20 MEQ tablet Take 1 tablet (20 mEq total) by mouth daily. 08/13/20   [name], MD  valsartan (DIOVAN) 160 MG tablet Take 1 tablet (160 mg total) by mouth daily. Quit amlodipine 08/10/20   [name], MD  VITAMIN D PO Take 1 capsule by mouth daily.    [provider]  amLODipine (NORVASC) 5 MG tablet TAKE 1 TABLET BY MOUTH DAILY. 08/02/20 08/10/20  [name], MD  hydrochlorothiazide (HYDRODIURIL) 25 MG tablet TAKE 1 TABLET BY MOUTH DAILY. 11/17/19 12/25/19  [name], MD    Allergies    Lisinopril  Review of Systems   Review of Systems  Constitutional: Negative for fever.  Respiratory: Negative for shortness of breath.   Cardiovascular:  Negative for leg swelling.  Musculoskeletal: Positive for joint swelling.       Ankle pain  All other systems reviewed and are negative.   Physical Exam Updated Vital Signs BP (!) 172/93 (BP Location: Right Arm)   Pulse 82   Temp 98.2 F (36.8 C) (Oral)   Resp 16   SpO2 94%   Physical Exam Vitals and nursing note reviewed.  Constitutional:  Appearance: She is well-developed. She is not ill-appearing.  HENT:     Head: Normocephalic and atraumatic.     Mouth/Throat:     Mouth: Mucous membranes are moist.  Eyes:     Pupils: Pupils are equal, round, and reactive to light.  Cardiovascular:     Rate and Rhythm: Normal rate and regular rhythm.  Pulmonary:     Effort: Pulmonary effort is normal. No respiratory distress.  Musculoskeletal:     Comments: Focused examination of bilateral legs with trace bilateral lower extremity edema bilaterally, there is tenderness to palpation posterior along the Achilles and into the left calf, no asymmetric swelling, there is mild similar tenderness of the right calf, no obvious deformities, no overlying skin changes, normal flexion and extension range of motion of the ankles, 2+ DP pulse  Skin:    General: Skin is warm and dry.  Neurological:     Mental Status: She is alert and oriented to person, place, and time.  Psychiatric:        Mood and Affect: Mood normal.     ED Results / Procedures / Treatments   Labs (all labs ordered are listed, but only abnormal results are displayed) Labs Reviewed - No data to display  EKG None  Radiology No results found.  Procedures Procedures   Medications Ordered in ED Medications  HYDROcodone-acetaminophen (NORCO/VICODIN) 5-325 MG per tablet 1 tablet (1 tablet Oral Given 08/15/20 2352)    ED Course  I have reviewed the triage vital signs and the nursing notes.  Pertinent labs & imaging results that were available during my care of the patient were reviewed by me and considered in my  medical decision making (see chart for details).    MDM Rules/Calculators/A&P                          Patient presents with pain in the left ankle greater than right.  She is overall nontoxic and vital signs are reassuring.  She denies any trauma.  She is exquisitely tender posteriorly along the Achilles although her Achilles appears intact.  It also goes into her calf but she has no tenderness to palpation of the calf muscle itself.  There are no deformities to suggest fracture and I feel that without trauma, x-rays would be low yield.  She almost has a hyperparesthesia.  Daughter does report she started complaining of pain when they started walking earlier in the week.  Could be overuse injury or related to slight swelling noted.  She was given 1 Norco.  Recommend very short course of scheduled ibuprofen.  Recommend ice and elevation.  Follow-up with primary physician.  Although I feel less likely, I did schedule her for DVT evaluation later today given that her symptoms radiate into her calf muscles.  After history, exam, and medical workup I feel the patient has been appropriately medically screened and is safe for discharge home. Pertinent diagnoses were discussed with the patient. Patient was given return precautions.  Final Clinical Impression(s) / ED Diagnoses Final diagnoses:  Acute left ankle pain    Rx / DC Orders ED Discharge Orders         Ordered    VAS [place] LOWER EXTREMITY VENOUS (DVT)        08/16/20 0003           Merryl Hacker, MD 08/16/20 0008

## 2020-08-16 NOTE — ED Notes (Signed)
Pt states she feels much better at d/c home. Aware that she will return tomorrow afternoon at 1pm for her u/s.

## 2020-08-16 NOTE — Telephone Encounter (Signed)
Continue valsartan, this should not have any affect on her leg pain or swelling.  Actually valsartan will cause less leg swelling than amlodipine.

## 2020-08-16 NOTE — Telephone Encounter (Signed)
Received call from patient.   Reports that she was changed from Amlodipine to Valsartan for her blood pressure. States that since she has changed medication, she has been having increased pain in B ankles, feet and lower legs. States that L>R.   Reports that she was seen in ER and is scheduled for [place] to RO DVT today.   Inquired if she should continue Valsartan or resume Amlodipine.   Please advise.

## 2020-08-16 NOTE — ED Notes (Signed)
Radiology at bedside to schedule u/s for AM

## 2020-08-27 ENCOUNTER — Telehealth: Payer: Self-pay | Admitting: Family Medicine

## 2020-08-27 NOTE — Telephone Encounter (Signed)
Ppt called in stating that she may be having a reaction to a new med that she started Rx #: [id number]  valsartan (DIOVAN) 160  Pt stated that since she has taken this med her tendons in the heels of both feet have swollen so bad that she had to visit the ER recently, and is currently in pain and may need to go to the ER again today. She wanted to know if she should stop taking this med. Please advise.  Cb#: [id number]

## 2020-08-27 NOTE — Telephone Encounter (Signed)
Patient states that she has increased pain in B ankles, feet and lower legs. States that L>R.  States that she has hard knot to tendon in her LE that is extremely tender to touch.  Patient states that she has had [place] for DVT and noted negative.   Patient did call in for advice on 08/16/2020. PCP felt Valsartan was not the cause of leg pain. Advised to continue Valsartan as it causes less edema than Amlodipine.   Patient is convinced that Valsartan is causing her issues, and states that she does not want to take it any longer. Advised to stop Valsartan for trial. Advised to resume Amlodipine for her BP.  Advised if pain continues, then medication is not the cause.

## 2020-08-27 NOTE — Telephone Encounter (Signed)
Noted, but I don't thin valsartan is causing ankle pain.

## 2020-09-01 ENCOUNTER — Other Ambulatory Visit (HOSPITAL_COMMUNITY): Payer: Self-pay

## 2020-09-01 ENCOUNTER — Other Ambulatory Visit: Payer: Self-pay | Admitting: Family Medicine

## 2020-09-01 DIAGNOSIS — E876 Hypokalemia: Secondary | ICD-10-CM

## 2020-09-01 MED FILL — Montelukast Sodium Tab 10 MG (Base Equiv): ORAL | 30 days supply | Qty: 30 | Fill #1 | Status: AC

## 2020-09-01 MED FILL — Pantoprazole Sodium EC Tab 40 MG (Base Equiv): ORAL | 30 days supply | Qty: 60 | Fill #1 | Status: AC

## 2020-09-01 MED FILL — Gabapentin Cap 300 MG: ORAL | 30 days supply | Qty: 90 | Fill #0 | Status: AC

## 2020-09-02 ENCOUNTER — Other Ambulatory Visit (HOSPITAL_COMMUNITY): Payer: Self-pay

## 2020-10-06 ENCOUNTER — Other Ambulatory Visit: Payer: Self-pay | Admitting: Family Medicine

## 2020-10-06 ENCOUNTER — Other Ambulatory Visit (HOSPITAL_COMMUNITY): Payer: Self-pay

## 2020-10-06 DIAGNOSIS — R053 Chronic cough: Secondary | ICD-10-CM

## 2020-10-06 MED ORDER — PANTOPRAZOLE SODIUM 40 MG PO TBEC
40.0000 mg | DELAYED_RELEASE_TABLET | Freq: Two times a day (BID) | ORAL | 2 refills | Status: DC
  Filled 2020-10-06: qty 60, 30d supply, fill #0
  Filled 2020-11-15: qty 60, 30d supply, fill #1
  Filled 2020-12-31: qty 60, 30d supply, fill #2

## 2020-10-06 MED ORDER — GABAPENTIN 300 MG PO CAPS
300.0000 mg | ORAL_CAPSULE | Freq: Three times a day (TID) | ORAL | 0 refills | Status: DC
  Filled 2020-10-06: qty 90, 30d supply, fill #0

## 2020-10-06 MED FILL — Atorvastatin Calcium Tab 20 MG (Base Equivalent): ORAL | 90 days supply | Qty: 90 | Fill #0 | Status: AC

## 2020-10-06 MED FILL — Montelukast Sodium Tab 10 MG (Base Equiv): ORAL | 30 days supply | Qty: 30 | Fill #2 | Status: AC

## 2020-11-08 ENCOUNTER — Other Ambulatory Visit (HOSPITAL_COMMUNITY): Payer: Self-pay

## 2020-11-08 MED ORDER — TOBRAMYCIN-DEXAMETHASONE 0.3-0.1 % OP SUSP
1.0000 [drp] | Freq: Four times a day (QID) | OPHTHALMIC | 0 refills | Status: AC
  Filled 2020-11-08: qty 5, 25d supply, fill #0

## 2020-11-15 ENCOUNTER — Other Ambulatory Visit: Payer: Self-pay | Admitting: Family Medicine

## 2020-11-15 ENCOUNTER — Other Ambulatory Visit (HOSPITAL_COMMUNITY): Payer: Self-pay

## 2020-11-15 DIAGNOSIS — E782 Mixed hyperlipidemia: Secondary | ICD-10-CM

## 2020-11-15 MED FILL — Montelukast Sodium Tab 10 MG (Base Equiv): ORAL | 30 days supply | Qty: 30 | Fill #3 | Status: AC

## 2020-11-16 ENCOUNTER — Other Ambulatory Visit (HOSPITAL_COMMUNITY): Payer: Self-pay

## 2020-11-16 MED ORDER — GABAPENTIN 300 MG PO CAPS
300.0000 mg | ORAL_CAPSULE | Freq: Three times a day (TID) | ORAL | 0 refills | Status: DC
  Filled 2020-11-16: qty 90, 30d supply, fill #0

## 2020-11-16 MED ORDER — ATORVASTATIN CALCIUM 20 MG PO TABS
20.0000 mg | ORAL_TABLET | Freq: Every day | ORAL | 1 refills | Status: DC
  Filled 2020-11-16: qty 90, fill #0
  Filled 2020-12-31: qty 90, 90d supply, fill #0
  Filled 2021-04-06: qty 90, 90d supply, fill #1

## 2020-11-16 MED ORDER — METFORMIN HCL 500 MG PO TABS
500.0000 mg | ORAL_TABLET | Freq: Two times a day (BID) | ORAL | 0 refills | Status: DC
  Filled 2020-11-16: qty 180, 90d supply, fill #0

## 2020-11-25 ENCOUNTER — Other Ambulatory Visit (HOSPITAL_COMMUNITY): Payer: Self-pay

## 2020-11-30 ENCOUNTER — Other Ambulatory Visit: Payer: Self-pay | Admitting: Family Medicine

## 2020-12-01 ENCOUNTER — Other Ambulatory Visit (HOSPITAL_COMMUNITY): Payer: Self-pay

## 2020-12-01 MED ORDER — FUROSEMIDE 20 MG PO TABS
20.0000 mg | ORAL_TABLET | Freq: Every day | ORAL | 2 refills | Status: DC
  Filled 2020-12-01: qty 30, 30d supply, fill #0

## 2020-12-07 ENCOUNTER — Other Ambulatory Visit (HOSPITAL_COMMUNITY): Payer: Self-pay

## 2020-12-07 ENCOUNTER — Other Ambulatory Visit: Payer: Self-pay | Admitting: *Deleted

## 2020-12-07 MED ORDER — FUROSEMIDE 20 MG PO TABS
20.0000 mg | ORAL_TABLET | Freq: Every day | ORAL | 2 refills | Status: DC
  Filled 2020-12-07: qty 30, 30d supply, fill #0
  Filled 2021-01-25: qty 30, 30d supply, fill #1
  Filled 2021-02-22: qty 30, 30d supply, fill #2

## 2020-12-15 ENCOUNTER — Other Ambulatory Visit (HOSPITAL_COMMUNITY): Payer: Self-pay

## 2020-12-16 MED FILL — Montelukast Sodium Tab 10 MG (Base Equiv): ORAL | 30 days supply | Qty: 30 | Fill #4 | Status: AC

## 2020-12-17 ENCOUNTER — Other Ambulatory Visit (HOSPITAL_COMMUNITY): Payer: Self-pay

## 2020-12-31 ENCOUNTER — Other Ambulatory Visit (HOSPITAL_COMMUNITY): Payer: Self-pay

## 2020-12-31 ENCOUNTER — Other Ambulatory Visit: Payer: Self-pay | Admitting: Family Medicine

## 2020-12-31 MED ORDER — AMLODIPINE BESYLATE 5 MG PO TABS
5.0000 mg | ORAL_TABLET | Freq: Every day | ORAL | 1 refills | Status: DC
  Filled 2020-12-31: qty 90, 90d supply, fill #0
  Filled 2021-04-06: qty 90, 90d supply, fill #1

## 2021-01-10 ENCOUNTER — Other Ambulatory Visit (HOSPITAL_COMMUNITY): Payer: Self-pay

## 2021-01-10 ENCOUNTER — Other Ambulatory Visit: Payer: Self-pay | Admitting: Family Medicine

## 2021-01-10 MED ORDER — GABAPENTIN 300 MG PO CAPS
300.0000 mg | ORAL_CAPSULE | Freq: Three times a day (TID) | ORAL | 0 refills | Status: DC
  Filled 2021-01-10: qty 90, 30d supply, fill #0

## 2021-01-11 ENCOUNTER — Other Ambulatory Visit (HOSPITAL_COMMUNITY): Payer: Self-pay

## 2021-01-11 MED ORDER — KETOROLAC TROMETHAMINE 0.5 % OP SOLN
1.0000 [drp] | Freq: Four times a day (QID) | OPHTHALMIC | 1 refills | Status: DC
  Filled 2021-01-11: qty 5, 25d supply, fill #0

## 2021-01-11 MED ORDER — GATIFLOXACIN 0.5 % OP SOLN
1.0000 [drp] | Freq: Four times a day (QID) | OPHTHALMIC | 1 refills | Status: DC
  Filled 2021-01-11: qty 5, 25d supply, fill #0
  Filled 2021-01-18: qty 2.5, 18d supply, fill #0
  Filled 2021-01-18: qty 2.5, 12d supply, fill #0
  Filled 2021-01-18: qty 5, 18d supply, fill #0
  Filled 2021-02-24 – 2021-03-10 (×4): qty 2.5, 12d supply, fill #1

## 2021-01-11 MED ORDER — PREDNISOLONE ACETATE 1 % OP SUSP
1.0000 [drp] | Freq: Four times a day (QID) | OPHTHALMIC | 1 refills | Status: DC
  Filled 2021-01-11: qty 5, 25d supply, fill #0

## 2021-01-13 ENCOUNTER — Other Ambulatory Visit (HOSPITAL_COMMUNITY): Payer: Self-pay

## 2021-01-18 ENCOUNTER — Other Ambulatory Visit (HOSPITAL_COMMUNITY): Payer: Self-pay

## 2021-01-25 ENCOUNTER — Other Ambulatory Visit (HOSPITAL_COMMUNITY): Payer: Self-pay

## 2021-01-25 MED FILL — Montelukast Sodium Tab 10 MG (Base Equiv): ORAL | 30 days supply | Qty: 30 | Fill #5 | Status: AC

## 2021-01-26 ENCOUNTER — Other Ambulatory Visit (HOSPITAL_COMMUNITY): Payer: Self-pay

## 2021-01-27 ENCOUNTER — Other Ambulatory Visit: Payer: Self-pay

## 2021-01-27 ENCOUNTER — Ambulatory Visit (INDEPENDENT_AMBULATORY_CARE_PROVIDER_SITE_OTHER): Payer: Medicare PPO

## 2021-01-27 VITALS — BP 136/78 | HR 85 | Temp 98.6°F | Ht 67.0 in | Wt 204.0 lb

## 2021-01-27 DIAGNOSIS — Z0000 Encounter for general adult medical examination without abnormal findings: Secondary | ICD-10-CM | POA: Diagnosis not present

## 2021-01-27 DIAGNOSIS — Z23 Encounter for immunization: Secondary | ICD-10-CM

## 2021-01-27 NOTE — Progress Notes (Signed)
Subjective:   Jennifer Wu is a 80 y.o. female who presents for an Initial Medicare Annual Wellness Visit.  Review of Systems     Cardiac Risk Factors include: advanced age (>35men, >68 women);diabetes mellitus;hypertension;dyslipidemia;obesity (BMI >30kg/m2);sedentary lifestyle     Objective:    Today's Vitals   01/27/21 1042  BP: 136/78  Pulse: 85  Temp: 98.6 F (37 C)  SpO2: 97%  Weight: 204 lb (92.5 kg)  Height: 5\' 7"  (1.702 m)   Body mass index is 31.95 kg/m.  Advanced Directives 01/27/2021 08/15/2020 05/07/2020 03/30/2017 05/30/2011 01/31/2011  Does Patient Have a Medical Advance Directive? No No Yes Yes Patient does not have advance directive Patient has advance directive, copy not in chart  Type of Advance Directive - - [place];Living will Parole;Out of facility DNR (pink MOST or yellow form) - [place]  Does patient want to make changes to medical advance directive? - - No - Patient declined - - -  Copy of [place] in Chart? - - No - copy requested - - -  Would patient like information on creating a medical advance directive? No - Patient declined No - Patient declined - - - -    Current Medications (verified) Outpatient Encounter Medications as of 01/27/2021  Medication Sig   albuterol (VENTOLIN HFA) 108 (90 Base) MCG/ACT inhaler Inhale 2 puffs into the lungs every 6 (six) hours as needed for wheezing or shortness of breath.   amLODipine (NORVASC) 5 MG tablet Take 1 tablet (5 mg total) by mouth daily.   atorvastatin (LIPITOR) 20 MG tablet Take 1 tablet (20 mg total) by mouth daily.   cyclobenzaprine (FLEXERIL) 5 MG tablet Take 1 tablet (5 mg total) by mouth 3 (three) times daily as needed for muscle spasms.   famotidine (PEPCID) 40 MG tablet TAKE 1 TABLET (40 MG TOTAL) BY MOUTH DAILY.   furosemide (LASIX) 20 MG tablet TAKE 1 TABLET (20 MG TOTAL) BY MOUTH DAILY. STOP HYDROCHLOROTHIAZIDE    gabapentin (NEURONTIN) 300 MG capsule Take 1 capsule (300 mg total) by mouth 3 (three) times daily.   gatifloxacin (ZYMAXID) 0.5 % SOLN Place 1 drop into the left eye 4 (four) times daily as directed.   ketorolac (ACULAR) 0.5 % ophthalmic solution Place 1 drop into the left eye 4 (four) times daily as directed   loperamide (IMODIUM) 2 MG capsule TAKE 1 CAPSULE BY MOUTH EVERY 8 HOURS AS NEEDED FOR DIARRHEA OR LOOSE STOOLS   metFORMIN (GLUCOPHAGE) 500 MG tablet Take 1 tablet (500 mg total) by mouth 2 (two) times daily with a meal.   montelukast (SINGULAIR) 10 MG tablet TAKE 1 TABLET BY MOUTH AT BEDTIME. (Patient taking differently: Take 10 mg by mouth at bedtime.)   pantoprazole (PROTONIX) 40 MG tablet Take 1 tablet (40 mg total) by mouth 2 (two) times daily.   potassium chloride SA (KLOR-CON) 20 MEQ tablet Take 1 tablet (20 mEq total) by mouth daily.   prednisoLONE acetate (PRED FORTE) 1 % ophthalmic suspension Place 1 drop into the left eye 4 (four) times daily as directed. Shake well before each use   VITAMIN D PO Take 1 capsule by mouth daily.   [DISCONTINUED] hydrochlorothiazide (HYDRODIURIL) 25 MG tablet TAKE 1 TABLET BY MOUTH DAILY.   [DISCONTINUED] valsartan (DIOVAN) 160 MG tablet Take 1 tablet (160 mg total) by mouth daily. Quit amlodipine   No facility-administered encounter medications on file as of 01/27/2021.  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-able to use now   Seasonal allergies    Vitamin D deficiency    Past Surgical History:  Procedure Laterality  Date   BACK SURGERY     BREAST SURGERY     COLONOSCOPY     DILATION AND CURETTAGE OF UTERUS  1976   MAB   ELBOW SURGERY Left 01/26/2011   No BP lt arm due to titanum rod   ELBOW SURGERY     FRACTURE SURGERY  rt foot-1990   HYSTEROSCOPY WITH D & C  06/09/2011   Procedure: DILATATION AND CURETTAGE /HYSTEROSCOPY;  Surgeon: Cheri Fowler, MD;  Location: [facility];  Service: Gynecology;  Laterality: N/A;   KNEE SURGERY  10/12   right knee   LUMBAR LAMINECTOMY  1991   LYMPH NODE BIOPSY     right breast   RADIAL HEAD IMPLANT  02/01/2011   Procedure: RADIAL HEAD IMPLANT;  Surgeon: Schuyler Amor, MD;  Location: [facility];  Service: Orthopedics;  Laterality: Left;  left radial head replacement   SVD     x 3   Family History  Problem Relation Age of Onset   Heart disease Mother        MI's   CAD Mother    Diabetes Mother    Alzheimer's disease Father    Alcohol abuse Father        cirrhosis   CAD Brother    Diabetes Brother    Heart disease Brother        MI's   Social History   Socioeconomic History   Marital status: Married    Spouse name: Glendell Docker   Number of children: 3   Years of education: Not on file   Highest education level: Not on file  Occupational History   Not on file  Tobacco Use   Smoking status: Never   Smokeless tobacco: Never  Vaping Use   Vaping Use: Never used  Substance and Sexual Activity   Alcohol use: No   Drug use: No   Sexual activity: Not on file  Other Topics Concern   Not on file  Social History Narrative   3 daughters   1 currently under treatment for uterine cancer-2022.   13 grandchildren   9 great grandchildren   Social Determinants of Radio broadcast assistant Strain: Low Risk    Difficulty of Paying Living Expenses: Not hard at all  Food Insecurity: No Food Insecurity   Worried About Charity fundraiser in the Last Year: Never true   Arboriculturist in the Last Year: Never true  Transportation Needs: No  Transportation Needs   Lack of Transportation (Medical): No   Lack of Transportation (Non-Medical): No  Physical Activity: Sufficiently Active   Days of Exercise per Week: 5 days   Minutes of Exercise per Session: 30 min  Stress: No Stress Concern Present   Feeling of Stress : Only a little  Social Connections: Engineer, building services of Communication with  Friends and Family: More than three times a week   Frequency of Social Gatherings with Friends and Family: More than three times a week   Attends Religious Services: More than 4 times per year   Active Member of [employer] or Organizations: Yes   Attends Music therapist: More than 4 times per year   Marital Status: Married    Tobacco Counseling Counseling given: Not Answered   Clinical Intake:  Pre-visit preparation completed: Yes  Pain : No/denies pain     BMI - recorded: 31.95 Nutritional Status: BMI > 30  Obese Nutritional Risks: None Diabetes: No (Pre-diabetes per pt.)  How often do you need to have someone help you when you read instructions, pamphlets, or other written materials from your doctor or pharmacy?: 1 - Never Nutrition Risk Assessment:  Has the patient had any N/V/D within the last 2 months?  No  Does the patient have any non-healing wounds?  No  Has the patient had any unintentional weight loss or weight gain?  No   Diabetes:  Is the patient diabetic?  Yes  If diabetic, was a CBG obtained today?  No  Did the patient bring in their glucometer from home?  No  How often do you monitor your CBG's? Daily.   Financial Strains and Diabetes Management:  Are you having any financial strains with the device, your supplies or your medication? No .  Does the patient want to be seen by Chronic Care Management for management of their diabetes?  No  Would the patient like to be referred to a Nutritionist or for Diabetic Management?  No   Diabetic Exams:  Diabetic Eye Exam: Completed 11/2020.  Pt has been advised about the importance in completing this exam. Has upcoming cataract surgery scheduled for 11/30 and 03/09/21.  Diabetic Foot Exam: Pt has been advised about the importance in completing this exam. Pt is scheduled for diabetic foot exam on 02/10/2021.  Interpreter Needed?: No  Information entered by :: MJ Maytte Jacot,LPN   Activities of Daily Living In your present state of health, do you have any difficulty performing the following activities: 01/27/2021 05/07/2020  Hearing? N N  Vision? N N  Difficulty concentrating or making decisions? Y N  Walking or climbing stairs? N Y  Dressing or bathing? N N  Doing errands, shopping? N N  Preparing Food and eating ? N -  Using the Toilet? N -  In the past six months, have you accidently leaked urine? Y -  Comment Sees urologist for tx. -  Do you have problems with loss of bowel control? N -  Managing your Medications? N -  Managing your Finances? N -  Housekeeping or managing your Housekeeping? N -  Some recent data might be hidden    Patient Care Team: [name], MD as PCP - General (Family Medicine)  Indicate any recent Medical Services you may have received from other than [facility] providers in the past year (date may be approximate).     Assessment:   This is a routine wellness examination for [place].  Hearing/Vision screen Hearing Screening - Comments:: No hearing issues. Vision Screening - Comments:: Readers. Has cataract surgery scheduled for 02/23/21 and 03/09/2021.  Dietary issues and exercise activities discussed: Current Exercise Habits: Home exercise routine, Type of exercise: walking, Time (Minutes): 30, Frequency (Times/Week): 5, Weekly Exercise (Minutes/Week): 150, Intensity: Mild, Exercise limited by: cardiac condition(s)   Goals Addressed  This Visit's Progress    Have 3 meals a day       Stay healthy. Exercise more.        Depression Screen PHQ 2/9 Scores 01/27/2021  08/10/2020 06/16/2019 04/26/2017  PHQ - 2 Score 0 0 0 0    Fall Risk Fall Risk  01/27/2021 08/10/2020 06/16/2019 04/26/2017  Falls in the past year? 1 1 0 Yes  Number falls in past yr: 0 1 - 2 or more  Injury with Fall? 1 0 - Yes  Comment Broke shoulder in 4 places. - - -  Risk for fall due to : Impaired vision;History of fall(s) History of fall(s);Impaired balance/gait - -  Follow up Falls prevention discussed Falls evaluation completed;Education provided;Falls prevention discussed Falls evaluation completed Falls evaluation completed    FALL RISK PREVENTION PERTAINING TO THE HOME:  Any stairs in or around the home? Yes  If so, are there any without handrails? No  Home free of loose throw rugs in walkways, pet beds, electrical cords, etc? Yes  Adequate lighting in your home to reduce risk of falls? Yes   ASSISTIVE DEVICES UTILIZED TO PREVENT FALLS:  Life alert? No  Use of a cane, walker or w/c? No  Grab bars in the bathroom? Yes  Shower chair or bench in shower? Yes  Elevated toilet seat or a handicapped toilet? Yes   TIMED UP AND GO:  Was the test performed? Yes .  Length of time to ambulate 10 feet: 10 sec.   Gait steady and fast without use of assistive device  Cognitive Function:     6CIT Screen 01/27/2021  What Year? 0 points  What month? 0 points  What time? 3 points  Count back from 20 0 points  Months in reverse 0 points  Repeat phrase 0 points  Total Score 3    Immunizations Immunization History  Administered Date(s) Administered   Influenza Split 01/24/2012   Influenza-Unspecified 01/27/2021   PFIZER(Purple Top)SARS-COV-2 Vaccination 05/10/2019, 06/04/2019   PNEUMOCOCCAL CONJUGATE-20 08/10/2020   Pneumococcal Polysaccharide-23 01/24/2012   Tdap 01/23/2011    TDAP status: Up to date  Flu Vaccine status: Due, Education has been provided regarding the importance of this vaccine. Advised may receive this vaccine at local pharmacy or Health Dept. Aware to  provide a copy of the vaccination record if obtained from local pharmacy or Health Dept. Verbalized acceptance and understanding.  Pneumococcal vaccine status: Up to date  Covid-19 vaccine status: Information provided on how to obtain vaccines.   Qualifies for Shingles Vaccine? Yes   Zostavax completed No   Shingrix Completed?: No.    Education has been provided regarding the importance of this vaccine. Patient has been advised to call insurance company to determine out of pocket expense if they have not yet received this vaccine. Advised may also receive vaccine at local pharmacy or Health Dept. Verbalized acceptance and understanding.  Screening Tests Health Maintenance  Topic Date Due   FOOT EXAM  Never done   OPHTHALMOLOGY EXAM  Never done   Hepatitis C Screening  Never done   Zoster Vaccines- Shingrix (1 of 2) Never done   COVID-19 Vaccine (3 - Booster for Pfizer series) 07/30/2019   TETANUS/TDAP  01/22/2021   HEMOGLOBIN A1C  02/10/2021   MAMMOGRAM  04/08/2021   URINE MICROALBUMIN  08/10/2021   COLONOSCOPY (Pts 45-11yrs Insurance coverage will need to be confirmed)  03/30/2025   Pneumonia Vaccine 62+ Years old  Completed   INFLUENZA VACCINE  Completed  DEXA SCAN  Completed   HPV VACCINES  Aged Out    Health Maintenance  Health Maintenance Due  Topic Date Due   FOOT EXAM  Never done   OPHTHALMOLOGY EXAM  Never done   Hepatitis C Screening  Never done   Zoster Vaccines- Shingrix (1 of 2) Never done   COVID-19 Vaccine (3 - Booster for Pfizer series) 07/30/2019   TETANUS/TDAP  01/22/2021    Colorectal cancer screening: Type of screening: Colonoscopy. Completed 03/30/20. Repeat every 5 years  Mammogram status: Completed 04/08/2020. Repeat every year  Bone Density status: Completed 10/30/2018. Results reflect: Bone density results: OSTEOPENIA. Repeat every 2 years.  Lung Cancer Screening: (Low Dose CT Chest recommended if Age 51-80 years, 30 pack-year currently smoking OR  have quit w/in 15years.) does not qualify.     Additional Screening:  Hepatitis C Screening: does not qualify.  Vision Screening: Recommended annual ophthalmology exams for early detection of glaucoma and other disorders of the eye. Is the patient up to date with their annual eye exam?  Yes  Who is the provider or what is the name of the office in which the patient attends annual eye exams? [facility] [place] If pt is not established with a provider, would they like to be referred to a provider to establish care? No .   Dental Screening: Recommended annual dental exams for proper oral hygiene  Community Resource Referral / Chronic Care Management: CRR required this visit?  No   CCM required this visit?  No      Plan:     I have personally reviewed and noted the following in the patient's chart:   Medical and social history Use of alcohol, tobacco or illicit drugs  Current medications and supplements including opioid prescriptions. Patient is not currently taking opioid prescriptions. Functional ability and status Nutritional status Physical activity Advanced directives List of other physicians Hospitalizations, surgeries, and ER visits in previous 12 months Vitals Screenings to include cognitive, depression, and falls Referrals and appointments  In addition, I have reviewed and discussed with patient certain preventive protocols, quality metrics, and best practice recommendations. A written personalized care plan for preventive services as well as general preventive health recommendations were provided to patient.     Chriss Driver, LPN   93/11/298   Nurse Notes: Pt talkative in room. Discussing daughter's current health issues related to uterine cancer dx. Up to date on Health Maintenance. Flu vaccine given today. Has cataract surgery scheduled for end of  November.

## 2021-01-27 NOTE — Patient Instructions (Signed)
Jennifer Wu , Thank you for taking time to come for your Medicare Wellness Visit. I appreciate your ongoing commitment to your health goals. Please review the following plan we discussed and let me know if I can assist you in the future.   Screening recommendations/referrals: Colonoscopy: Done 03/30/2020 Repeat in 5 years  Mammogram: Done 04/08/2020 Repeat annually  Bone Density: Done 10/30/2018 Repeat every 2 years  Recommended yearly ophthalmology/optometry visit for glaucoma screening and checkup Recommended yearly dental visit for hygiene and checkup  Vaccinations: Influenza vaccine: 01/27/2021 Pneumococcal vaccine: 01/24/2012 and 08/10/2020 Tdap vaccine: 01/23/2011 Repeat in 10 years  Shingles vaccine: Shingrix discussed. Please contact your pharmacy for coverage information.     Covid-19:Done 05/10/2019 and 06/04/2019  Advanced directives: Advance directive discussed with you today. Even though you declined this today, please call our office should you change your mind, and we can give you the proper paperwork for you to fill out.   Conditions/risks identified: Aim for 30 minutes of exercise or brisk walking each day, drink 6-8 glasses of water and eat lots of fruits and vegetables. KEEP UP THE GOOD WORK!!  Next appointment: Follow up in one year for your annual wellness visit 2023.   Preventive Care 79 Years and Older, Female Preventive care refers to lifestyle choices and visits with your health care provider that can promote health and wellness. What does preventive care include? A yearly physical exam. This is also called an annual well check. Dental exams once or twice a year. Routine eye exams. Ask your health care provider how often you should have your eyes checked. Personal lifestyle choices, including: Daily care of your teeth and gums. Regular physical activity. Eating a healthy diet. Avoiding tobacco and drug use. Limiting alcohol use. Practicing safe sex. Taking  low-dose aspirin every day. Taking vitamin and mineral supplements as recommended by your health care provider. What happens during an annual well check? The services and screenings done by your health care provider during your annual well check will depend on your age, overall health, lifestyle risk factors, and family history of disease. Counseling  Your health care provider may ask you questions about your: Alcohol use. Tobacco use. Drug use. Emotional well-being. Home and relationship well-being. Sexual activity. Eating habits. History of falls. Memory and ability to understand (cognition). Work and work Statistician. Reproductive health. Screening  You may have the following tests or measurements: Height, weight, and BMI. Blood pressure. Lipid and cholesterol levels. These may be checked every 5 years, or more frequently if you are over 72 years old. Skin check. Lung cancer screening. You may have this screening every year starting at age 79 if you have a 30-pack-year history of smoking and currently smoke or have quit within the past 15 years. Fecal occult blood test (FOBT) of the stool. You may have this test every year starting at age 79. Flexible sigmoidoscopy or colonoscopy. You may have a sigmoidoscopy every 5 years or a colonoscopy every 10 years starting at age 36. Hepatitis C blood test. Hepatitis B blood test. Sexually transmitted disease (STD) testing. Diabetes screening. This is done by checking your blood sugar (glucose) after you have not eaten for a while (fasting). You may have this done every 1-3 years. Bone density scan. This is done to screen for osteoporosis. You may have this done starting at age 79. Mammogram. This may be done every 1-2 years. Talk to your health care provider about how often you should have regular mammograms. Talk with your  health care provider about your test results, treatment options, and if necessary, the need for more tests. Vaccines   Your health care provider may recommend certain vaccines, such as: Influenza vaccine. This is recommended every year. Tetanus, diphtheria, and acellular pertussis (Tdap, Td) vaccine. You may need a Td booster every 10 years. Zoster vaccine. You may need this after age 79. Pneumococcal 13-valent conjugate (PCV13) vaccine. One dose is recommended after age 79. Pneumococcal polysaccharide (PPSV23) vaccine. One dose is recommended after age 2. Talk to your health care provider about which screenings and vaccines you need and how often you need them. This information is not intended to replace advice given to you by your health care provider. Make sure you discuss any questions you have with your health care provider. Document Released: 04/09/2015 Document Revised: 12/01/2015 Document Reviewed: 01/12/2015 Elsevier Interactive Patient Education  2017 [place] Prevention in the Home Falls can cause injuries. They can happen to people of all ages. There are many things you can do to make your home safe and to help prevent falls. What can I do on the outside of my home? Regularly fix the edges of walkways and driveways and fix any cracks. Remove anything that might make you trip as you walk through a door, such as a raised step or threshold. Trim any bushes or trees on the path to your home. Use bright outdoor lighting. Clear any walking paths of anything that might make someone trip, such as rocks or tools. Regularly check to see if handrails are loose or broken. Make sure that both sides of any steps have handrails. Any raised decks and porches should have guardrails on the edges. Have any leaves, snow, or ice cleared regularly. Use sand or salt on walking paths during winter. Clean up any spills in your garage right away. This includes oil or grease spills. What can I do in the bathroom? Use night lights. Install grab bars by the toilet and in the tub and shower. Do not use towel  bars as grab bars. Use non-skid mats or decals in the tub or shower. If you need to sit down in the shower, use a plastic, non-slip stool. Keep the floor dry. Clean up any water that spills on the floor as soon as it happens. Remove soap buildup in the tub or shower regularly. Attach bath mats securely with double-sided non-slip rug tape. Do not have throw rugs and other things on the floor that can make you trip. What can I do in the bedroom? Use night lights. Make sure that you have a light by your bed that is easy to reach. Do not use any sheets or blankets that are too big for your bed. They should not hang down onto the floor. Have a firm chair that has side arms. You can use this for support while you get dressed. Do not have throw rugs and other things on the floor that can make you trip. What can I do in the kitchen? Clean up any spills right away. Avoid walking on wet floors. Keep items that you use a lot in easy-to-reach places. If you need to reach something above you, use a strong step stool that has a grab bar. Keep electrical cords out of the way. Do not use floor polish or wax that makes floors slippery. If you must use wax, use non-skid floor wax. Do not have throw rugs and other things on the floor that can make you trip. What  can I do with my stairs? Do not leave any items on the stairs. Make sure that there are handrails on both sides of the stairs and use them. Fix handrails that are broken or loose. Make sure that handrails are as long as the stairways. Check any carpeting to make sure that it is firmly attached to the stairs. Fix any carpet that is loose or worn. Avoid having throw rugs at the top or bottom of the stairs. If you do have throw rugs, attach them to the floor with carpet tape. Make sure that you have a light switch at the top of the stairs and the bottom of the stairs. If you do not have them, ask someone to add them for you. What else can I do to help  prevent falls? Wear shoes that: Do not have high heels. Have rubber bottoms. Are comfortable and fit you well. Are closed at the toe. Do not wear sandals. If you use a stepladder: Make sure that it is fully opened. Do not climb a closed stepladder. Make sure that both sides of the stepladder are locked into place. Ask someone to hold it for you, if possible. Clearly mark and make sure that you can see: Any grab bars or handrails. First and last steps. Where the edge of each step is. Use tools that help you move around (mobility aids) if they are needed. These include: Canes. Walkers. Scooters. Crutches. Turn on the lights when you go into a dark area. Replace any light bulbs as soon as they burn out. Set up your furniture so you have a clear path. Avoid moving your furniture around. If any of your floors are uneven, fix them. If there are any pets around you, be aware of where they are. Review your medicines with your doctor. Some medicines can make you feel dizzy. This can increase your chance of falling. Ask your doctor what other things that you can do to help prevent falls. This information is not intended to replace advice given to you by your health care provider. Make sure you discuss any questions you have with your health care provider. Document Released: 01/07/2009 Document Revised: 08/19/2015 Document Reviewed: 04/17/2014 Elsevier Interactive Patient Education  2017 [employer].

## 2021-02-10 ENCOUNTER — Encounter: Payer: Self-pay | Admitting: Family Medicine

## 2021-02-10 ENCOUNTER — Other Ambulatory Visit: Payer: Self-pay

## 2021-02-10 ENCOUNTER — Ambulatory Visit: Payer: Medicare PPO | Admitting: Family Medicine

## 2021-02-10 VITALS — BP 148/82 | HR 84 | Temp 99.2°F | Resp 18 | Ht 67.0 in | Wt 209.0 lb

## 2021-02-10 DIAGNOSIS — E785 Hyperlipidemia, unspecified: Secondary | ICD-10-CM | POA: Diagnosis not present

## 2021-02-10 DIAGNOSIS — R011 Cardiac murmur, unspecified: Secondary | ICD-10-CM | POA: Diagnosis not present

## 2021-02-10 DIAGNOSIS — I10 Essential (primary) hypertension: Secondary | ICD-10-CM

## 2021-02-10 DIAGNOSIS — Z23 Encounter for immunization: Secondary | ICD-10-CM

## 2021-02-10 DIAGNOSIS — E118 Type 2 diabetes mellitus with unspecified complications: Secondary | ICD-10-CM | POA: Diagnosis not present

## 2021-02-10 NOTE — Addendum Note (Signed)
Addended by: Sheral Flow on: 02/10/2021 09:56 AM   Modules accepted: Orders

## 2021-02-10 NOTE — Progress Notes (Signed)
Subjective:    Patient ID: Jennifer Wu, female    DOB: 1941-03-25, 80 y.o.   MRN: [id number]  Patient is here today for a checkup.  Per her report, her pulmonologist has released her due to the chronic cough.  They state that there is no more that they can do to help her.  She is already on Singulair, Asmanex, and H2 blocker, a proton pump inhibitor.  She is also on gabapentin for cough per her report.  She is here today to recheck her A1c.  Blood pressure is elevated 148/82 however the patient has missed her blood pressure medication for 2 days.  She continues to suffer with chronic cough.  She denies any chest pain shortness of breath or dyspnea on exertion.  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   Seasonal allergies    Vitamin D deficiency    Past Surgical History:  Procedure Laterality Date   BACK SURGERY     BREAST SURGERY     COLONOSCOPY     DILATION AND CURETTAGE OF UTERUS  1976   MAB   ELBOW SURGERY Left 01/26/2011   No BP lt arm due to titanum rod   ELBOW SURGERY     FRACTURE SURGERY  rt foot-1990   HYSTEROSCOPY WITH D & C  06/09/2011   Procedure: DILATATION AND CURETTAGE /HYSTEROSCOPY;  Surgeon: Cheri Fowler, MD;  Location: [facility];  Service: Gynecology;  Laterality: N/A;   KNEE SURGERY  10/12   right knee   LUMBAR LAMINECTOMY  1991   LYMPH NODE BIOPSY     right breast   RADIAL HEAD IMPLANT  02/01/2011   Procedure: RADIAL HEAD IMPLANT;  Surgeon: Schuyler Amor, MD;  Location:  [facility];  Service: Orthopedics;  Laterality: Left;  left radial head replacement   SVD     x 3   Current Outpatient Medications on File Prior to Visit  Medication Sig Dispense Refill   albuterol (VENTOLIN HFA) 108 (90 Base) MCG/ACT inhaler Inhale 2 puffs into the lungs every 6 (six) hours as needed for wheezing or shortness of breath. 6.7 g 0   amLODipine (NORVASC) 5 MG tablet Take 1 tablet (5 mg total) by mouth daily. 90 tablet 1   atorvastatin (LIPITOR) 20 MG tablet Take 1 tablet (20 mg total) by mouth daily. 90 tablet 1   cyclobenzaprine (FLEXERIL) 5 MG tablet Take 1 tablet (5 mg total) by mouth 3 (three) times daily as needed for muscle spasms. 90 tablet 1   famotidine (PEPCID) 40 MG tablet TAKE 1 TABLET (40 MG TOTAL) BY MOUTH DAILY. 90 tablet 3   furosemide (LASIX) 20 MG tablet TAKE 1 TABLET (20 MG TOTAL) BY MOUTH DAILY. STOP HYDROCHLOROTHIAZIDE 30 tablet 2   gabapentin (NEURONTIN) 300 MG capsule Take 1 capsule (300 mg total) by mouth 3 (three) times daily. 90 capsule 0   gatifloxacin (ZYMAXID) 0.5 % SOLN Place 1 drop into the left eye  4 (four) times daily as directed. 5 mL 1   ketorolac (ACULAR) 0.5 % ophthalmic solution Place 1 drop into the left eye 4 (four) times daily as directed 5 mL 1   loperamide (IMODIUM) 2 MG capsule TAKE 1 CAPSULE BY MOUTH EVERY 8 HOURS AS NEEDED FOR DIARRHEA OR LOOSE STOOLS 12 capsule 0   metFORMIN (GLUCOPHAGE) 500 MG tablet Take 1 tablet (500 mg total) by mouth 2 (two) times daily with a meal. 180 tablet 0   montelukast (SINGULAIR) 10 MG tablet TAKE 1 TABLET BY MOUTH AT BEDTIME. (Patient taking differently: Take 10 mg by mouth at bedtime.) 30 tablet 11   pantoprazole (PROTONIX) 40 MG tablet Take 1 tablet (40 mg total) by mouth 2 (two) times daily. 60 tablet 2   potassium chloride SA (KLOR-CON) 20 MEQ tablet Take 1 tablet (20 mEq total) by mouth daily. 30 tablet 0   prednisoLONE acetate (PRED FORTE) 1 % ophthalmic suspension Place 1 drop into  the left eye 4 (four) times daily as directed. Shake well before each use 5 mL 1   VITAMIN D PO Take 1 capsule by mouth daily.     [DISCONTINUED] hydrochlorothiazide (HYDRODIURIL) 25 MG tablet TAKE 1 TABLET BY MOUTH DAILY. 90 tablet 1   [DISCONTINUED] valsartan (DIOVAN) 160 MG tablet Take 1 tablet (160 mg total) by mouth daily. Quit amlodipine 90 tablet 3   No current facility-administered medications on file prior to visit.   Allergies  Allergen Reactions   Lisinopril Nausea And Vomiting   Social History   Socioeconomic History   Marital status: Married    Spouse name: Glendell Docker   Number of children: 3   Years of education: Not on file   Highest education level: Not on file  Occupational History   Not on file  Tobacco Use   Smoking status: Never   Smokeless tobacco: Never  Vaping Use   Vaping Use: Never used  Substance and Sexual Activity   Alcohol use: No   Drug use: No   Sexual activity: Not on file  Other Topics Concern   Not on file  Social History Narrative   3 daughters   1 currently under treatment for uterine cancer-2022.   13 grandchildren   84 great grandchildren   Social Determinants of Radio broadcast assistant Strain: Low Risk    Difficulty of Paying Living Expenses: Not hard at all  Food Insecurity: No Food Insecurity   Worried About Charity fundraiser in the Last Year: Never true   Arboriculturist in the Last Year: Never true  Transportation Needs: No Transportation Needs   Lack of Transportation (Medical): No   Lack of Transportation (Non-Medical): No  Physical Activity: Sufficiently Active   Days of Exercise per Week: 5 days   Minutes of Exercise per Session: 30 min  Stress: No Stress Concern Present   Feeling of Stress : Only a little  Social Connections: Engineer, building services of Communication with Friends and Family: More than three times a week   Frequency of Social Gatherings with Friends and Family: More than three times a week    Attends Religious Services: More than 4 times per year   Active Member of [employer] or Organizations: Yes   Attends Music therapist: More than 4 times per year   Marital Status: Married  Human resources officer Violence: Not At Risk   Fear of Current or Ex-Partner: No   Emotionally Abused: No  Physically Abused: No   Sexually Abused: No      Review of Systems  All other systems reviewed and are negative.     Objective:   Physical Exam Vitals reviewed.  Constitutional:      General: She is not in acute distress.    Appearance: She is well-developed. She is not diaphoretic.  Neck:     Thyroid: No thyromegaly.     Vascular: No JVD.  Cardiovascular:     Rate and Rhythm: Normal rate and regular rhythm.     Heart sounds: Murmur heard.  Pulmonary:     Effort: Pulmonary effort is normal. No respiratory distress.     Breath sounds: Normal breath sounds. No wheezing or rales.  Abdominal:     General: Bowel sounds are normal. There is no distension.     Palpations: Abdomen is soft.     Tenderness: There is no abdominal tenderness. There is no rebound.          Assessment & Plan:  Diabetes mellitus type 2 with complications ([place]) - Plan: CBC with Differential/Platelet, COMPLETE METABOLIC PANEL WITH GFR, Lipid panel, Hemoglobin A1c  Benign essential HTN - Plan: CBC with Differential/Platelet, COMPLETE METABOLIC PANEL WITH GFR, Lipid panel  Hyperlipidemia, unspecified hyperlipidemia type - Plan: CBC with Differential/Platelet, COMPLETE METABOLIC PANEL WITH GFR, Lipid panel Blood pressure today is elevated.  I asked the patient to resume her blood pressure medication and call me with her blood pressures in 1 week.  I want to keep her systolic blood pressure less than 140.  I will check an A1c along with a fasting lipid panel.  Goal A1c is less than 6.5.  Goal LDL cholesterol is less than 100.  My only other option for her chronic cough would be to try her on Symbicort in place of  Asmanex.  Patient will consider that.  I will schedule her for an echocardiogram but I suspect that she has developed aortic stenosis

## 2021-02-11 LAB — HEMOGLOBIN A1C
Hgb A1c MFr Bld: 6.8 % of total Hgb — ABNORMAL HIGH (ref <5.7)
Mean Plasma Glucose: 148 mg/dL
eAG (mmol/L): 8.2 mmol/L

## 2021-02-11 LAB — CBC WITH DIFFERENTIAL/PLATELET
Absolute Monocytes: 524 cells/uL (ref 200–950)
Basophils Absolute: 62 cells/uL (ref 0–200)
Basophils Relative: 0.9 %
Eosinophils Absolute: 200 cells/uL (ref 15–500)
Eosinophils Relative: 2.9 %
HCT: 42.4 % (ref 35.0–45.0)
Hemoglobin: 13.5 g/dL (ref 11.7–15.5)
Lymphs Abs: 2160 cells/uL (ref 850–3900)
MCH: 27.3 pg (ref 27.0–33.0)
MCHC: 31.8 g/dL — ABNORMAL LOW (ref 32.0–36.0)
MCV: 85.7 fL (ref 80.0–100.0)
MPV: 11.1 fL (ref 7.5–12.5)
Monocytes Relative: 7.6 %
Neutro Abs: 3954 cells/uL (ref 1500–7800)
Neutrophils Relative %: 57.3 %
Platelets: 308 10*3/uL (ref 140–400)
RBC: 4.95 10*6/uL (ref 3.80–5.10)
RDW: 13.1 % (ref 11.0–15.0)
Total Lymphocyte: 31.3 %
WBC: 6.9 10*3/uL (ref 3.8–10.8)

## 2021-02-11 LAB — COMPLETE METABOLIC PANEL WITH GFR
AG Ratio: 1.5 (calc) (ref 1.0–2.5)
ALT: 15 U/L (ref 6–29)
AST: 14 U/L (ref 10–35)
Albumin: 4.2 g/dL (ref 3.6–5.1)
Alkaline phosphatase (APISO): 80 U/L (ref 37–153)
BUN: 15 mg/dL (ref 7–25)
CO2: 29 mmol/L (ref 20–32)
Calcium: 10.2 mg/dL (ref 8.6–10.4)
Chloride: 99 mmol/L (ref 98–110)
Creat: 0.76 mg/dL (ref 0.60–1.00)
Globulin: 2.8 g/dL (calc) (ref 1.9–3.7)
Glucose, Bld: 195 mg/dL — ABNORMAL HIGH (ref 65–99)
Potassium: 4.2 mmol/L (ref 3.5–5.3)
Sodium: 140 mmol/L (ref 135–146)
Total Bilirubin: 0.4 mg/dL (ref 0.2–1.2)
Total Protein: 7 g/dL (ref 6.1–8.1)
eGFR: 80 mL/min/{1.73_m2} (ref >=60)

## 2021-02-11 LAB — LIPID PANEL
Cholesterol: 161 mg/dL (ref <200)
HDL: 45 mg/dL — ABNORMAL LOW (ref >=50)
LDL Cholesterol (Calc): 89 mg/dL (calc)
Non-HDL Cholesterol (Calc): 116 mg/dL (calc) (ref <130)
Total CHOL/HDL Ratio: 3.6 (calc) (ref <5.0)
Triglycerides: 176 mg/dL — ABNORMAL HIGH (ref <150)

## 2021-02-15 ENCOUNTER — Other Ambulatory Visit (HOSPITAL_COMMUNITY): Payer: Self-pay

## 2021-02-15 MED ORDER — GATIFLOXACIN 0.5 % OP SOLN
1.0000 [drp] | Freq: Four times a day (QID) | OPHTHALMIC | 1 refills | Status: DC
  Filled 2021-02-15: qty 2.5, 13d supply, fill #0
  Filled 2021-02-24: qty 2.5, 13d supply, fill #1

## 2021-02-15 MED ORDER — PREDNISOLONE ACETATE 1 % OP SUSP
1.0000 [drp] | Freq: Four times a day (QID) | OPHTHALMIC | 1 refills | Status: DC
  Filled 2021-02-15: qty 5, 25d supply, fill #0
  Filled 2021-03-10: qty 5, 25d supply, fill #1

## 2021-02-15 MED ORDER — KETOROLAC TROMETHAMINE 0.5 % OP SOLN
1.0000 [drp] | Freq: Four times a day (QID) | OPHTHALMIC | 1 refills | Status: DC
Start: 2021-02-15 — End: 2022-02-24
  Filled 2021-02-15: qty 5, 25d supply, fill #0
  Filled 2021-03-10: qty 5, 25d supply, fill #1

## 2021-02-22 ENCOUNTER — Other Ambulatory Visit: Payer: Self-pay | Admitting: Family Medicine

## 2021-02-22 DIAGNOSIS — R053 Chronic cough: Secondary | ICD-10-CM

## 2021-02-22 MED FILL — Montelukast Sodium Tab 10 MG (Base Equiv): ORAL | 30 days supply | Qty: 30 | Fill #6 | Status: AC

## 2021-02-23 ENCOUNTER — Other Ambulatory Visit (HOSPITAL_COMMUNITY): Payer: Self-pay

## 2021-02-23 MED ORDER — PANTOPRAZOLE SODIUM 40 MG PO TBEC
40.0000 mg | DELAYED_RELEASE_TABLET | Freq: Two times a day (BID) | ORAL | 2 refills | Status: DC
  Filled 2021-02-23: qty 60, 30d supply, fill #0
  Filled 2021-04-06: qty 60, 30d supply, fill #1
  Filled 2021-05-05: qty 60, 30d supply, fill #2

## 2021-02-23 MED ORDER — METFORMIN HCL 500 MG PO TABS
500.0000 mg | ORAL_TABLET | Freq: Two times a day (BID) | ORAL | 0 refills | Status: DC
  Filled 2021-02-23: qty 180, 90d supply, fill #0

## 2021-02-24 ENCOUNTER — Other Ambulatory Visit (HOSPITAL_COMMUNITY): Payer: Self-pay

## 2021-02-25 ENCOUNTER — Other Ambulatory Visit (HOSPITAL_COMMUNITY): Payer: Self-pay

## 2021-03-10 ENCOUNTER — Other Ambulatory Visit (HOSPITAL_COMMUNITY): Payer: Self-pay

## 2021-03-11 ENCOUNTER — Other Ambulatory Visit (HOSPITAL_COMMUNITY): Payer: Self-pay

## 2021-03-11 MED ORDER — SULFAMETHOXAZOLE-TRIMETHOPRIM 800-160 MG PO TABS
1.0000 | ORAL_TABLET | Freq: Two times a day (BID) | ORAL | 0 refills | Status: DC
  Filled 2021-03-11: qty 6, 3d supply, fill #0

## 2021-03-16 ENCOUNTER — Other Ambulatory Visit: Payer: Self-pay

## 2021-03-16 ENCOUNTER — Ambulatory Visit (HOSPITAL_COMMUNITY): Payer: Medicare PPO | Attending: Cardiology

## 2021-03-16 DIAGNOSIS — R011 Cardiac murmur, unspecified: Secondary | ICD-10-CM | POA: Insufficient documentation

## 2021-03-16 LAB — ECHOCARDIOGRAM COMPLETE
AR max vel: 1.52 cm2
AV Area VTI: 1.69 cm2
AV Area mean vel: 1.49 cm2
AV Mean grad: 16 mmHg
AV Peak grad: 29.2 mmHg
Ao pk vel: 2.7 m/s
Area-P 1/2: 8.07 cm2
S' Lateral: 2.5 cm

## 2021-04-06 ENCOUNTER — Other Ambulatory Visit: Payer: Self-pay | Admitting: Family Medicine

## 2021-04-06 ENCOUNTER — Other Ambulatory Visit: Payer: Self-pay | Admitting: Internal Medicine

## 2021-04-06 DIAGNOSIS — J45991 Cough variant asthma: Secondary | ICD-10-CM

## 2021-04-07 ENCOUNTER — Other Ambulatory Visit (HOSPITAL_COMMUNITY): Payer: Self-pay

## 2021-04-07 MED ORDER — FUROSEMIDE 20 MG PO TABS
20.0000 mg | ORAL_TABLET | Freq: Every day | ORAL | 2 refills | Status: DC
  Filled 2021-04-07: qty 30, 30d supply, fill #0
  Filled 2021-05-05: qty 30, 30d supply, fill #1
  Filled 2021-06-13: qty 30, 30d supply, fill #2

## 2021-04-07 MED ORDER — METFORMIN HCL 500 MG PO TABS
500.0000 mg | ORAL_TABLET | Freq: Two times a day (BID) | ORAL | 0 refills | Status: DC
  Filled 2021-04-07 – 2021-05-11 (×2): qty 180, 90d supply, fill #0

## 2021-04-07 MED ORDER — CYCLOBENZAPRINE HCL 5 MG PO TABS
5.0000 mg | ORAL_TABLET | Freq: Three times a day (TID) | ORAL | 1 refills | Status: DC | PRN
  Filled 2021-04-07: qty 90, 30d supply, fill #0

## 2021-04-07 MED ORDER — GABAPENTIN 300 MG PO CAPS
300.0000 mg | ORAL_CAPSULE | Freq: Three times a day (TID) | ORAL | 0 refills | Status: DC
  Filled 2021-04-07: qty 90, 30d supply, fill #0

## 2021-04-07 NOTE — Telephone Encounter (Signed)
Cyclobenzaprine refill request.  Last seen 12/2020, last filled 08/10/2020.

## 2021-04-08 ENCOUNTER — Other Ambulatory Visit: Payer: Self-pay | Admitting: Internal Medicine

## 2021-04-08 DIAGNOSIS — J45991 Cough variant asthma: Secondary | ICD-10-CM

## 2021-04-11 ENCOUNTER — Other Ambulatory Visit (HOSPITAL_COMMUNITY): Payer: Self-pay

## 2021-04-11 DIAGNOSIS — Z1231 Encounter for screening mammogram for malignant neoplasm of breast: Secondary | ICD-10-CM | POA: Diagnosis not present

## 2021-04-11 LAB — HM MAMMOGRAPHY

## 2021-04-22 ENCOUNTER — Other Ambulatory Visit (HOSPITAL_COMMUNITY): Payer: Self-pay

## 2021-05-05 ENCOUNTER — Other Ambulatory Visit (HOSPITAL_COMMUNITY): Payer: Self-pay

## 2021-05-11 ENCOUNTER — Other Ambulatory Visit (HOSPITAL_COMMUNITY): Payer: Self-pay

## 2021-06-13 ENCOUNTER — Other Ambulatory Visit (HOSPITAL_COMMUNITY): Payer: Self-pay

## 2021-06-13 ENCOUNTER — Other Ambulatory Visit: Payer: Self-pay | Admitting: Family Medicine

## 2021-06-13 DIAGNOSIS — R053 Chronic cough: Secondary | ICD-10-CM

## 2021-06-14 ENCOUNTER — Other Ambulatory Visit: Payer: Self-pay

## 2021-06-14 ENCOUNTER — Other Ambulatory Visit (HOSPITAL_COMMUNITY): Payer: Self-pay

## 2021-06-14 MED ORDER — PANTOPRAZOLE SODIUM 40 MG PO TBEC
40.0000 mg | DELAYED_RELEASE_TABLET | Freq: Two times a day (BID) | ORAL | 2 refills | Status: DC
  Filled 2021-06-14: qty 60, 30d supply, fill #0
  Filled 2021-07-20: qty 60, 30d supply, fill #1
  Filled 2021-08-24: qty 60, 30d supply, fill #2

## 2021-07-20 ENCOUNTER — Other Ambulatory Visit: Payer: Self-pay | Admitting: Family Medicine

## 2021-07-20 ENCOUNTER — Other Ambulatory Visit (HOSPITAL_COMMUNITY): Payer: Self-pay

## 2021-07-20 DIAGNOSIS — E782 Mixed hyperlipidemia: Secondary | ICD-10-CM

## 2021-07-20 MED ORDER — AMLODIPINE BESYLATE 5 MG PO TABS
5.0000 mg | ORAL_TABLET | Freq: Every day | ORAL | 1 refills | Status: DC
  Filled 2021-07-20: qty 90, 90d supply, fill #0
  Filled 2021-10-30: qty 90, 90d supply, fill #1

## 2021-07-20 MED ORDER — ATORVASTATIN CALCIUM 20 MG PO TABS
20.0000 mg | ORAL_TABLET | Freq: Every day | ORAL | 1 refills | Status: DC
  Filled 2021-07-20: qty 90, 90d supply, fill #0
  Filled 2021-10-30: qty 90, 90d supply, fill #1

## 2021-07-20 MED ORDER — FUROSEMIDE 20 MG PO TABS
20.0000 mg | ORAL_TABLET | Freq: Every day | ORAL | 2 refills | Status: DC
  Filled 2021-07-20: qty 30, 30d supply, fill #0
  Filled 2021-08-31: qty 30, 30d supply, fill #1
  Filled 2021-10-30: qty 30, 30d supply, fill #2

## 2021-08-02 ENCOUNTER — Other Ambulatory Visit: Payer: Self-pay | Admitting: Family Medicine

## 2021-08-03 ENCOUNTER — Other Ambulatory Visit (HOSPITAL_COMMUNITY): Payer: Self-pay

## 2021-08-03 MED ORDER — METFORMIN HCL 500 MG PO TABS
500.0000 mg | ORAL_TABLET | Freq: Two times a day (BID) | ORAL | 0 refills | Status: DC
  Filled 2021-08-03: qty 180, 90d supply, fill #0

## 2021-08-03 NOTE — Telephone Encounter (Signed)
Requested medication (s) are due for refill today: yes ? ?Requested medication (s) are on the active medication list: yes ? ?Last refill:  04/07/21 #180/0 ? ?Future visit scheduled: no ? ?Notes to clinic:  Unable to refill per protocol, medication not assigned to the refill protocol.  ? ? ?  ?Requested Prescriptions  ?Pending Prescriptions Disp Refills  ? metFORMIN (GLUCOPHAGE) 500 MG tablet 180 tablet 0  ?  Sig: Take 1 tablet (500 mg total) by mouth 2 (two) times daily with a meal.  ?  ? There is no refill protocol information for this order  ?  ? ?

## 2021-08-08 ENCOUNTER — Ambulatory Visit
Admission: EM | Admit: 2021-08-08 | Discharge: 2021-08-08 | Disposition: A | Discharge status: Home / Self Care | Payer: Medicare PPO | Attending: Emergency Medicine | Admitting: Emergency Medicine

## 2021-08-08 ENCOUNTER — Other Ambulatory Visit (HOSPITAL_COMMUNITY): Payer: Self-pay

## 2021-08-08 ENCOUNTER — Ambulatory Visit: Payer: Medicare PPO

## 2021-08-08 ENCOUNTER — Encounter: Payer: Self-pay | Admitting: Emergency Medicine

## 2021-08-08 DIAGNOSIS — J4531 Mild persistent asthma with (acute) exacerbation: Secondary | ICD-10-CM | POA: Diagnosis not present

## 2021-08-08 DIAGNOSIS — I10 Essential (primary) hypertension: Secondary | ICD-10-CM

## 2021-08-08 DIAGNOSIS — R059 Cough, unspecified: Secondary | ICD-10-CM

## 2021-08-08 DIAGNOSIS — R053 Chronic cough: Secondary | ICD-10-CM

## 2021-08-08 MED ORDER — ALBUTEROL SULFATE HFA 108 (90 BASE) MCG/ACT IN AERS
1.0000 | INHALATION_SPRAY | Freq: Four times a day (QID) | RESPIRATORY_TRACT | 0 refills | Status: DC | PRN
  Filled 2021-08-08: qty 18, 25d supply, fill #0

## 2021-08-08 MED ORDER — AZITHROMYCIN 250 MG PO TABS
ORAL_TABLET | ORAL | 0 refills | Status: AC
  Filled 2021-08-08: qty 6, 5d supply, fill #0

## 2021-08-08 MED ORDER — PREDNISONE 10 MG PO TABS
40.0000 mg | ORAL_TABLET | Freq: Every day | ORAL | 0 refills | Status: DC
  Filled 2021-08-08: qty 20, 5d supply, fill #0

## 2021-08-08 NOTE — ED Provider Notes (Signed)
?[facility] ? ? ? ?CSN: [id number] ?Arrival date & time: 08/08/21  1503 ? ? ?  ? ?History   ?Chief Complaint ?Chief Complaint  ?Patient presents with  ? Cough  ? ? ?HPI ?Jennifer Wu is a 81 y.o. female.  Accompanied by her daughter, patient presents with congestion and productive cough x 2 weeks.  She reports mild shortness of breath at times; none currently.  Symptoms are worse at night.  Patient reports history of chronic cough but not usually productive and is worse for the past two weeks.  No fever, sore throat, or other symptoms.  Recent exposure to strep.  Her medical history includes asthma, diabetes, hypertension.  Patient has seen pulmonologist in the past; last seen in 2021.  ? ?The history is provided by the patient, a relative and medical records.  ? ?Past Medical History:  ?Diagnosis Date  ? Arthritis   ? knees - otc med prn  ? Cancer ([place]) 2000-rt lumpectomy-no bp rt arm  ? Closed fracture of left proximal humerus   ? Diabetes mellitus type 2 with complications ([place])   ? GERD (gastroesophageal reflux disease)   ? High cholesterol   ? Mixed dyslipidemia   ? 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 Seasonal allergies   ? Vitamin D deficiency   ? ? ?Patient Active Problem List  ? Diagnosis Date Noted  ? Acute respiratory failure due to COVID-19 [facility]) 05/07/2020  ? Essential hypertension 05/07/2020  ? HLD (hyperlipidemia) 05/07/2020  ? Diabetes mellitus type 2 with complications ([place])   ? Closed fracture of left proximal humerus   ? Cough variant asthma vs UACS 04/30/2018  ? Asthma with acute exacerbation 07/24/2012  ? Vitamin D deficiency   ? Seasonal allergies 07/19/2012  ? Mixed dyslipidemia   ? ? ?Past Surgical History:  ?Procedure Laterality Date  ? BACK SURGERY    ? BREAST SURGERY    ? COLONOSCOPY    ? DILATION AND CURETTAGE OF UTERUS  1976  ? MAB  ? ELBOW SURGERY Left 01/26/2011  ? No BP lt arm  due to titanum rod  ? ELBOW SURGERY    ? FRACTURE SURGERY  rt foot-1990  ? HYSTEROSCOPY WITH D & C  06/09/2011  ? Procedure: DILATATION AND CURETTAGE /HYSTEROSCOPY;  Surgeon: Cheri Fowler, MD;  Location: [facility];  Service: Gynecology;  Laterality: N/A;  ? KNEE SURGERY  10/12  ? right knee  ? LUMBAR LAMINECTOMY  1991  ? LYMPH NODE BIOPSY    ? right breast  ? RADIAL HEAD IMPLANT  02/01/2011  ? Procedure: RADIAL HEAD IMPLANT;  Surgeon: Schuyler Amor, MD;  Location: [facility];  Service: Orthopedics;  Laterality: Left;  left radial head replacement  ? SVD    ? x 3  ? ? ?OB History   ?No obstetric history on file. ?  ? ? ? ?Home Medications   ? ?Prior to Admission medications   ?Medication Sig Start Date End Date Taking? Authorizing Provider  ?albuterol (VENTOLIN HFA) 108 (90 Base) MCG/ACT inhaler Inhale 1-2 puffs into the lungs every 6 (six) hours as needed for wheezing or shortness of breath. 08/08/21  Yes Sharion Balloon, NP  ?amLODipine (NORVASC) 5 MG tablet Take 1 tablet (5 mg total) by mouth daily. 07/20/21  Yes [name], MD  ?atorvastatin (LIPITOR) 20 MG tablet Take 1 tablet (20 mg total) by mouth  daily. 07/20/21  Yes [name], MD  ?azithromycin (ZITHROMAX) 250 MG tablet Take 2 tablets (500 mg total) by mouth daily for 1 day, THEN 1 tablet (250 mg total) daily for 4 days. 08/08/21 08/13/21 Yes Sharion Balloon, NP  ?famotidine (PEPCID) 40 MG tablet TAKE 1 TABLET (40 MG TOTAL) BY MOUTH DAILY. 08/02/20  Yes [name], MD  ?furosemide (LASIX) 20 MG tablet Take 1 tablet (20 mg total) by mouth daily. Stop hydrochlorothiazide. 07/20/21  Yes [name], MD  ?gabapentin (NEURONTIN) 300 MG capsule Take 1 capsule (300 mg total) by mouth 3 (three) times daily. 04/07/21  Yes [name], MD  ?metFORMIN (GLUCOPHAGE) 500 MG tablet Take 1 tablet (500 mg total) by mouth 2 (two) times daily with a meal. 08/03/21  Yes [name], MD  ?pantoprazole (PROTONIX) 40 MG tablet Take 1  tablet (40 mg total) by mouth 2 (two) times daily. 06/14/21  Yes [name], MD  ?predniSONE (DELTASONE) 10 MG tablet Take 4 tablets (40 mg total) by mouth daily for 5 days 08/08/21  Yes Sharion Balloon, NP  ?cyclobenzaprine (FLEXERIL) 5 MG tablet Take 1 tablet (5 mg total) by mouth 3 (three) times daily as needed for muscle spasms. 04/07/21   [name], MD  ?gatifloxacin (ZYMAXID) 0.5 % SOLN Place 1 drop into the right eye 4 (four) times daily as directed 02/15/21   Darleen Crocker, MD  ?ketorolac (ACULAR) 0.5 % ophthalmic solution Place 1 drop into the right eye 4 (four) times daily as directed 02/15/21   Darleen Crocker, MD  ?montelukast (SINGULAIR) 10 MG tablet TAKE 1 TABLET BY MOUTH AT BEDTIME. ?Patient taking differently: Take 10 mg by mouth at bedtime. 02/26/20 03/28/21  Tanda Rockers, MD  ?potassium chloride SA (KLOR-CON) 20 MEQ tablet Take 1 tablet (20 mEq total) by mouth daily. 08/13/20   [name], MD  ?prednisoLONE acetate (PRED FORTE) 1 % ophthalmic suspension Place 1 drop into the right eye 4 (four) times daily as directed, please shake well before each use 02/15/21     ?VITAMIN D PO Take 1 capsule by mouth daily.    [provider]  ?hydrochlorothiazide (HYDRODIURIL) 25 MG tablet TAKE 1 TABLET BY MOUTH DAILY. 11/17/19 12/25/19  [name], MD  ?valsartan (DIOVAN) 160 MG tablet Take 1 tablet (160 mg total) by mouth daily. Quit amlodipine 08/10/20 09/01/20  [name], MD  ? ? ?Family History ?Family History  ?Problem Relation Age of Onset  ? Heart disease Mother   ?     MI's  ? CAD Mother   ? Diabetes Mother   ? Alzheimer's disease Father   ? Alcohol abuse Father   ?     cirrhosis  ? CAD Brother   ? Diabetes Brother   ? Heart disease Brother   ?     MI's  ? ? ?Social History ?Social History  ? ?Tobacco Use  ? Smoking status: Never  ? Smokeless tobacco: Never  ?Vaping Use  ? Vaping Use: Never used  ?Substance Use Topics  ? Alcohol use: No  ? Drug use: No   ? ? ? ?Allergies   ?Lisinopril ? ? ?Review of Systems ?Review of Systems  ?Constitutional:  Negative for chills and fever.  ?HENT:  Positive for congestion. Negative for ear pain and sore throat.   ?Respiratory:  Positive for cough and shortness of breath.   ?Cardiovascular:  Negative for chest pain and palpitations.  ?  Skin:  Negative for color change and rash.  ?All other systems reviewed and are negative. ? ? ?Physical Exam ?Triage Vital Signs ?ED Triage Vitals  ?Enc Vitals Group  ?   BP   ?   Pulse   ?   Resp   ?   Temp   ?   Temp src   ?   SpO2   ?   Weight   ?   Height   ?   Head Circumference   ?   Peak Flow   ?   Pain Score   ?   Pain Loc   ?   Pain Edu?   ?   Excl. in [place]?   ? ?No data found. ? ?Updated Vital Signs ?BP (!) 168/68 (BP Location: Left Arm)   Pulse 96   Temp 98 ?F (36.7 ?C) (Oral)   Resp 18   SpO2 96%  ? ?Visual Acuity ?Right Eye Distance:   ?Left Eye Distance:   ?Bilateral Distance:   ? ?Right Eye Near:   ?Left Eye Near:    ?Bilateral Near:    ? ?Physical Exam ?Vitals and nursing note reviewed.  ?Constitutional:   ?   General: She is not in acute distress. ?   Appearance: Normal appearance. She is well-developed. She is not ill-appearing.  ?HENT:  ?   Right Ear: Tympanic membrane normal.  ?   Left Ear: Tympanic membrane normal.  ?   Nose: Nose normal.  ?   Mouth/Throat:  ?   Mouth: Mucous membranes are moist.  ?   Pharynx: Oropharynx is clear.  ?Cardiovascular:  ?   Rate and Rhythm: Normal rate and regular rhythm.  ?   Heart sounds: Normal heart sounds.  ?Pulmonary:  ?   Effort: Pulmonary effort is normal. No respiratory distress.  ?   Breath sounds: Normal breath sounds. No wheezing.  ?Musculoskeletal:  ?   Cervical back: Neck supple.  ?Skin: ?   General: Skin is warm and dry.  ?Neurological:  ?   Mental Status: She is alert.  ?Psychiatric:     ?   Mood and Affect: Mood normal.     ?   Behavior: Behavior normal.  ? ? ? ?UC Treatments / Results  ?Labs ?(all labs ordered are listed, but only  abnormal results are displayed) ?Labs Reviewed - No data to display ? ?EKG ? ? ?Radiology ?No results found. ? ?Procedures ?Procedures (including critical care time) ? ?Medications Ordered in UC ?Medications - No data to disp

## 2021-08-08 NOTE — Discharge Instructions (Addendum)
Take the Zithromax and prednisone as directed.  Use the albuterol inhaler as directed.  Follow up with your primary care provider or pulmonologist if you are not improving.  Go to the emergency department if you have shortness of breath or other concerning symptoms.  ? ?Your blood pressure is elevated today at 168/68.  Please have this rechecked by your primary care provider in 1-2 weeks.     ? ?

## 2021-08-08 NOTE — ED Triage Notes (Signed)
Patient c/o productive cough w/ " green" sputum x 2 weeks.  ? ?Patient denies sore throat or fever.  ? ?Patient endorses chest congestion. Patient endorses cough worsens at night.  ? ?Patient endorses exposure to Strep.  ? 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 Patient follows a Pulmonologist.  ?

## 2021-08-09 ENCOUNTER — Ambulatory Visit: Admit: 2021-08-09 | Discharge status: Against Medical Advice | Payer: Medicare PPO

## 2021-08-09 ENCOUNTER — Telehealth: Payer: Self-pay

## 2021-08-12 DIAGNOSIS — C50911 Malignant neoplasm of unspecified site of right female breast: Secondary | ICD-10-CM | POA: Diagnosis not present

## 2021-08-24 ENCOUNTER — Other Ambulatory Visit (HOSPITAL_COMMUNITY): Payer: Self-pay

## 2021-08-24 DIAGNOSIS — C50911 Malignant neoplasm of unspecified site of right female breast: Secondary | ICD-10-CM | POA: Diagnosis not present

## 2021-08-25 DIAGNOSIS — D23112 Other benign neoplasm of skin of right lower eyelid, including canthus: Secondary | ICD-10-CM | POA: Diagnosis not present

## 2021-08-25 DIAGNOSIS — H524 Presbyopia: Secondary | ICD-10-CM | POA: Diagnosis not present

## 2021-08-25 DIAGNOSIS — D23122 Other benign neoplasm of skin of left lower eyelid, including canthus: Secondary | ICD-10-CM | POA: Diagnosis not present

## 2021-08-25 DIAGNOSIS — E119 Type 2 diabetes mellitus without complications: Secondary | ICD-10-CM | POA: Diagnosis not present

## 2021-08-31 ENCOUNTER — Other Ambulatory Visit (HOSPITAL_COMMUNITY): Payer: Self-pay

## 2021-09-22 ENCOUNTER — Other Ambulatory Visit (HOSPITAL_COMMUNITY): Payer: Self-pay

## 2021-09-22 ENCOUNTER — Other Ambulatory Visit: Payer: Self-pay | Admitting: Family Medicine

## 2021-09-22 DIAGNOSIS — R053 Chronic cough: Secondary | ICD-10-CM

## 2021-09-22 MED ORDER — PANTOPRAZOLE SODIUM 40 MG PO TBEC
40.0000 mg | DELAYED_RELEASE_TABLET | Freq: Two times a day (BID) | ORAL | 2 refills | Status: DC
  Filled 2021-09-22: qty 60, 30d supply, fill #0
  Filled 2021-10-30: qty 60, 30d supply, fill #1
  Filled 2021-12-10: qty 60, 30d supply, fill #2

## 2021-10-20 ENCOUNTER — Other Ambulatory Visit: Payer: Self-pay | Admitting: Family Medicine

## 2021-10-20 ENCOUNTER — Other Ambulatory Visit (HOSPITAL_COMMUNITY): Payer: Self-pay

## 2021-10-20 ENCOUNTER — Telehealth: Payer: Self-pay

## 2021-10-20 MED ORDER — METFORMIN HCL 500 MG PO TABS: 500.0000 mg | ORAL_TABLET | Freq: Two times a day (BID) | ORAL | 0 refills | Status: DC

## 2021-10-20 NOTE — Telephone Encounter (Signed)
Pt called in stating that she is out of town and will need a refill on a few of her meds. Pt would like to [facility] in [place]. [phone]. Pt is scheduled for a follow up appt on 8/29. Pt is asking for enough to last until this appt. Please advise.  Rx #: [id number]  metFORMIN (GLUCOPHAGE) 500 MG   Cb#: [phone]

## 2021-10-31 ENCOUNTER — Other Ambulatory Visit (HOSPITAL_COMMUNITY): Payer: Self-pay

## 2021-11-04 DIAGNOSIS — C50911 Malignant neoplasm of unspecified site of right female breast: Secondary | ICD-10-CM | POA: Diagnosis not present

## 2021-11-15 ENCOUNTER — Other Ambulatory Visit (HOSPITAL_COMMUNITY): Payer: Self-pay

## 2021-11-15 DIAGNOSIS — R3915 Urgency of urination: Secondary | ICD-10-CM | POA: Diagnosis not present

## 2021-11-15 MED ORDER — SULFAMETHOXAZOLE-TRIMETHOPRIM 800-160 MG PO TABS
1.0000 | ORAL_TABLET | Freq: Two times a day (BID) | ORAL | 0 refills | Status: DC
  Filled 2021-11-15: qty 10, 5d supply, fill #0

## 2021-11-22 ENCOUNTER — Ambulatory Visit: Payer: Medicare PPO | Admitting: Family Medicine

## 2021-11-22 ENCOUNTER — Telehealth: Payer: Self-pay

## 2021-11-22 VITALS — BP 132/82 | HR 82 | Temp 98.4°F | Ht 67.0 in | Wt 202.4 lb

## 2021-11-22 DIAGNOSIS — E118 Type 2 diabetes mellitus with unspecified complications: Secondary | ICD-10-CM | POA: Diagnosis not present

## 2021-11-22 NOTE — Progress Notes (Signed)
Subjective:    Patient ID: Jennifer Wu, female    DOB: 1940/08/26, 81 y.o.   MRN: [id number]  Patient has a documented murmur.  Echocardiogram in December showed aortic sclerosis but no significant stenosis.  Ejection fraction was normal.  Murmur persist today but she is asymptomatic.  Blood pressure is excellent at 132/82.  She is currently taking metformin twice daily for diabetes.  She denies any polyuria polydipsia or blurry vision.  Her fasting blood sugars in the morning are typically 102-110.  She denies any hypoglycemic episodes.  Diabetic foot exam was performed today and is normal. 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-able to use now   Seasonal allergies    Vitamin D deficiency    Past Surgical History:  Procedure Laterality Date   BACK SURGERY     BREAST SURGERY     COLONOSCOPY     DILATION AND CURETTAGE OF UTERUS  1976   MAB   ELBOW SURGERY Left 01/26/2011   No BP lt arm due to titanum rod   ELBOW SURGERY     FRACTURE SURGERY  rt foot-1990   HYSTEROSCOPY WITH D & C  06/09/2011   Procedure: DILATATION AND CURETTAGE /HYSTEROSCOPY;  Surgeon: Cheri Fowler, MD;  Location: [facility];  Service: Gynecology;  Laterality: N/A;   KNEE SURGERY  10/12   right knee   LUMBAR LAMINECTOMY  1991   LYMPH NODE BIOPSY     right breast   RADIAL HEAD IMPLANT  02/01/2011   Procedure: RADIAL HEAD IMPLANT;  Surgeon: Schuyler Amor, MD;  Location: [facility];  Service: Orthopedics;  Laterality: Left;  left radial head replacement   SVD     x 3   Current Outpatient Medications on File Prior to  Visit  Medication Sig Dispense Refill   albuterol (VENTOLIN HFA) 108 (90 Base) MCG/ACT inhaler Inhale 1-2 puffs into the lungs every 6 (six) hours as needed for wheezing or shortness of breath. 18 g 0   amLODipine (NORVASC) 5 MG tablet Take 1 tablet (5 mg total) by mouth daily. 90 tablet 1   atorvastatin (LIPITOR) 20 MG tablet Take 1 tablet (20 mg total) by mouth daily. 90 tablet 1   cyclobenzaprine (FLEXERIL) 5 MG tablet Take 1 tablet (5 mg total) by mouth 3 (three) times daily as needed for muscle spasms. 90 tablet 1   famotidine (PEPCID) 40 MG tablet TAKE 1 TABLET (40 MG TOTAL) BY MOUTH DAILY. 90 tablet 3   furosemide (LASIX) 20 MG tablet Take 1 tablet (20 mg total) by mouth daily. Stop hydrochlorothiazide. 30 tablet 2   gatifloxacin (ZYMAXID) 0.5 % SOLN Place 1 drop into the right eye 4 (four) times daily as directed 5 mL 1   metFORMIN (GLUCOPHAGE) 500 MG tablet Take 1 tablet (500 mg total) by mouth 2 (two) times daily with a meal. 180 tablet 0   pantoprazole (PROTONIX) 40 MG tablet Take 1 tablet (40 mg total) by mouth 2 (two) times daily. 60 tablet 2   pyridOXINE (VITAMIN B6) 100 MG tablet Take 100  mg by mouth daily.     VITAMIN D PO Take 1 capsule by mouth daily.     gabapentin (NEURONTIN) 300 MG capsule Take 1 capsule (300 mg total) by mouth 3 (three) times daily. (Patient not taking: Reported on 11/22/2021) 90 capsule 0   ketorolac (ACULAR) 0.5 % ophthalmic solution Place 1 drop into the right eye 4 (four) times daily as directed (Patient not taking: Reported on 11/22/2021) 5 mL 1   montelukast (SINGULAIR) 10 MG tablet TAKE 1 TABLET BY MOUTH AT BEDTIME. (Patient taking differently: Take 10 mg by mouth at bedtime.) 30 tablet 11   potassium chloride SA (KLOR-CON) 20 MEQ tablet Take 1 tablet (20 mEq total) by mouth daily. (Patient not taking: Reported on 11/22/2021) 30 tablet 0   prednisoLONE acetate (PRED FORTE) 1 % ophthalmic suspension Place 1 drop into the right eye 4 (four) times daily as  directed, please shake well before each use (Patient not taking: Reported on 11/22/2021) 5 mL 1   [DISCONTINUED] hydrochlorothiazide (HYDRODIURIL) 25 MG tablet TAKE 1 TABLET BY MOUTH DAILY. 90 tablet 1   [DISCONTINUED] valsartan (DIOVAN) 160 MG tablet Take 1 tablet (160 mg total) by mouth daily. Quit amlodipine 90 tablet 3   No current facility-administered medications on file prior to visit.   Allergies  Allergen Reactions   Lisinopril Nausea And Vomiting   Social History   Socioeconomic History   Marital status: Married    Spouse name: Glendell Docker   Number of children: 3   Years of education: Not on file   Highest education level: Not on file  Occupational History   Not on file  Tobacco Use   Smoking status: Never   Smokeless tobacco: Never  Vaping Use   Vaping Use: Never used  Substance and Sexual Activity   Alcohol use: No   Drug use: No   Sexual activity: Not on file  Other Topics Concern   Not on file  Social History Narrative   3 daughters   1 currently under treatment for uterine cancer-2022.   13 grandchildren   69 great grandchildren   Social Determinants of Health   Financial Resource Strain: Low Risk  (01/27/2021)   Overall Financial Resource Strain (CARDIA)    Difficulty of Paying Living Expenses: Not hard at all  Food Insecurity: No Food Insecurity (01/27/2021)   Hunger Vital Sign    Worried About Running Out of Food in the Last Year: Never true    [place] in the Last Year: Never true  Transportation Needs: No Transportation Needs (01/27/2021)   PRAPARE - Hydrologist (Medical): No    Lack of Transportation (Non-Medical): No  Physical Activity: Sufficiently Active (01/27/2021)   Exercise Vital Sign    Days of Exercise per Week: 5 days    Minutes of Exercise per Session: 30 min  Stress: No Stress Concern Present (01/27/2021)   [place]    Feeling of Stress :  Only a little  Social Connections: Socially Integrated (01/27/2021)   Social Connection and Isolation Panel [NHANES]    Frequency of Communication with Friends and Family: More than three times a week    Frequency of Social Gatherings with Friends and Family: More than three times a week    Attends Religious Services: More than 4 times per year    Active Member of [employer] or Organizations: Yes    Attends Archivist Meetings: More  than 4 times per year    Marital Status: Married  Human resources officer Violence: Not At Risk (01/27/2021)   Humiliation, Afraid, Rape, and Kick questionnaire    Fear of Current or Ex-Partner: No    Emotionally Abused: No    Physically Abused: No    Sexually Abused: No      Review of Systems  All other systems reviewed and are negative.      Objective:   Physical Exam Vitals reviewed.  Constitutional:      General: She is not in acute distress.    Appearance: She is well-developed. She is not diaphoretic.  Neck:     Thyroid: No thyromegaly.     Vascular: No JVD.  Cardiovascular:     Rate and Rhythm: Normal rate and regular rhythm.     Heart sounds: Murmur heard.  Pulmonary:     Effort: Pulmonary effort is normal. No respiratory distress.     Breath sounds: Normal breath sounds. No wheezing or rales.  Abdominal:     General: Bowel sounds are normal. There is no distension.     Palpations: Abdomen is soft.     Tenderness: There is no abdominal tenderness. There is no rebound.           Assessment & Plan:  Diabetes mellitus type 2 with complications ([place]) - Plan: CBC with Differential/Platelet, Lipid panel, COMPLETE METABOLIC PANEL WITH GFR, Hemoglobin A1c Patient's blood pressure is excellent.  I will check fasting lab work including a CBC a CMP and lipid panel and A1c.  Goal LDL cholesterol is less than 100.  Goal hemoglobin A1c is 6.5.  Patient's murmur is stable.  There is no significant aortic stenosis seen on echocardiogram in  December.

## 2021-11-22 NOTE — Telephone Encounter (Signed)
Pt had an ov today with pcp and forgot to mention to pcp that she needed refills of these meds   montelukast (SINGULAIR) 10 MG tablet  cyclobenzaprine (FLEXERIL) 5 MG tablet [[id number]]   Please send refills to [facility]  Cb#: [phone]

## 2021-11-23 ENCOUNTER — Other Ambulatory Visit (HOSPITAL_COMMUNITY): Payer: Self-pay

## 2021-11-23 ENCOUNTER — Other Ambulatory Visit: Payer: Self-pay

## 2021-11-23 DIAGNOSIS — J45901 Unspecified asthma with (acute) exacerbation: Secondary | ICD-10-CM

## 2021-11-23 DIAGNOSIS — J45991 Cough variant asthma: Secondary | ICD-10-CM

## 2021-11-23 LAB — CBC WITH DIFFERENTIAL/PLATELET
Absolute Monocytes: 391 cells/uL (ref 200–950)
Basophils Absolute: 37 cells/uL (ref 0–200)
Basophils Relative: 0.6 %
Eosinophils Absolute: 329 cells/uL (ref 15–500)
Eosinophils Relative: 5.3 %
HCT: 39.6 % (ref 35.0–45.0)
Hemoglobin: 13 g/dL (ref 11.7–15.5)
Lymphs Abs: 1848 cells/uL (ref 850–3900)
MCH: 27.4 pg (ref 27.0–33.0)
MCHC: 32.8 g/dL (ref 32.0–36.0)
MCV: 83.5 fL (ref 80.0–100.0)
MPV: 11.5 fL (ref 7.5–12.5)
Monocytes Relative: 6.3 %
Neutro Abs: 3596 cells/uL (ref 1500–7800)
Neutrophils Relative %: 58 %
Platelets: 264 10*3/uL (ref 140–400)
RBC: 4.74 10*6/uL (ref 3.80–5.10)
RDW: 12.6 % (ref 11.0–15.0)
Total Lymphocyte: 29.8 %
WBC: 6.2 10*3/uL (ref 3.8–10.8)

## 2021-11-23 LAB — HEMOGLOBIN A1C
Hgb A1c MFr Bld: 6.5 % of total Hgb — ABNORMAL HIGH (ref <5.7)
Mean Plasma Glucose: 140 mg/dL
eAG (mmol/L): 7.7 mmol/L

## 2021-11-23 LAB — COMPLETE METABOLIC PANEL WITH GFR
AG Ratio: 1.6 (calc) (ref 1.0–2.5)
ALT: 20 U/L (ref 6–29)
AST: 24 U/L (ref 10–35)
Albumin: 4.4 g/dL (ref 3.6–5.1)
Alkaline phosphatase (APISO): 77 U/L (ref 37–153)
BUN: 10 mg/dL (ref 7–25)
CO2: 21 mmol/L (ref 20–32)
Calcium: 9.6 mg/dL (ref 8.6–10.4)
Chloride: 103 mmol/L (ref 98–110)
Creat: 0.77 mg/dL (ref 0.60–1.00)
Globulin: 2.7 g/dL (calc) (ref 1.9–3.7)
Glucose, Bld: 111 mg/dL — ABNORMAL HIGH (ref 65–99)
Potassium: 3.5 mmol/L (ref 3.5–5.3)
Sodium: 139 mmol/L (ref 135–146)
Total Bilirubin: 0.5 mg/dL (ref 0.2–1.2)
Total Protein: 7.1 g/dL (ref 6.1–8.1)
eGFR: 78 mL/min/{1.73_m2} (ref >=60)

## 2021-11-23 LAB — LIPID PANEL
Cholesterol: 147 mg/dL (ref <200)
HDL: 40 mg/dL — ABNORMAL LOW (ref >=50)
LDL Cholesterol (Calc): 77 mg/dL (calc)
Non-HDL Cholesterol (Calc): 107 mg/dL (calc) (ref <130)
Total CHOL/HDL Ratio: 3.7 (calc) (ref <5.0)
Triglycerides: 207 mg/dL — ABNORMAL HIGH (ref <150)

## 2021-11-23 MED ORDER — MONTELUKAST SODIUM 10 MG PO TABS
10.0000 mg | ORAL_TABLET | Freq: Every day | ORAL | 11 refills | Status: DC
  Filled 2021-11-23: qty 30, 30d supply, fill #0
  Filled 2021-12-19: qty 30, 30d supply, fill #1
  Filled 2022-01-12: qty 30, 30d supply, fill #2
  Filled 2022-02-20: qty 30, 30d supply, fill #3
  Filled 2022-03-07 – 2022-03-21 (×2): qty 30, 30d supply, fill #4
  Filled 2022-04-11: qty 30, 30d supply, fill #5
  Filled 2022-05-29: qty 30, 30d supply, fill #6
  Filled 2022-06-28: qty 30, 30d supply, fill #7
  Filled 2022-07-25: qty 30, 30d supply, fill #8
  Filled 2022-08-26 – 2022-08-28 (×2): qty 30, 30d supply, fill #9
  Filled 2022-09-21: qty 30, 30d supply, fill #10
  Filled 2022-11-03 (×2): qty 30, 30d supply, fill #11

## 2021-11-24 ENCOUNTER — Other Ambulatory Visit (HOSPITAL_COMMUNITY): Payer: Self-pay

## 2021-11-24 ENCOUNTER — Other Ambulatory Visit: Payer: Self-pay | Admitting: Family Medicine

## 2021-11-24 MED ORDER — CYCLOBENZAPRINE HCL 5 MG PO TABS
5.0000 mg | ORAL_TABLET | Freq: Three times a day (TID) | ORAL | 1 refills | Status: DC | PRN
  Filled 2021-11-24: qty 90, 30d supply, fill #0
  Filled 2022-03-07: qty 90, 30d supply, fill #1

## 2021-11-25 ENCOUNTER — Other Ambulatory Visit (HOSPITAL_COMMUNITY): Payer: Self-pay

## 2021-12-04 DIAGNOSIS — R5383 Other fatigue: Secondary | ICD-10-CM | POA: Diagnosis not present

## 2021-12-04 DIAGNOSIS — J069 Acute upper respiratory infection, unspecified: Secondary | ICD-10-CM | POA: Diagnosis not present

## 2021-12-08 DIAGNOSIS — J069 Acute upper respiratory infection, unspecified: Secondary | ICD-10-CM | POA: Diagnosis not present

## 2021-12-10 ENCOUNTER — Other Ambulatory Visit: Payer: Self-pay | Admitting: Family Medicine

## 2021-12-12 ENCOUNTER — Other Ambulatory Visit (HOSPITAL_COMMUNITY): Payer: Self-pay

## 2021-12-12 MED ORDER — FUROSEMIDE 20 MG PO TABS
20.0000 mg | ORAL_TABLET | Freq: Every day | ORAL | 2 refills | Status: DC
  Filled 2021-12-12: qty 30, 30d supply, fill #0
  Filled 2022-02-06: qty 30, 30d supply, fill #1
  Filled 2022-03-07: qty 30, 30d supply, fill #2

## 2021-12-12 NOTE — Telephone Encounter (Signed)
Requested Prescriptions  Pending Prescriptions Disp Refills  . furosemide (LASIX) 20 MG tablet 30 tablet 2    Sig: Take 1 tablet (20 mg total) by mouth daily. Stop hydrochlorothiazide.     Cardiovascular:  Diuretics - Loop Failed - 12/10/2021  5:12 PM      Failed - Mg Level in normal range and within 180 days    Magnesium  Date Value Ref Range Status  05/09/2020 2.3 1.7 - 2.4 mg/dL Final    Comment:    Performed at [facility], [place] [address], [place], [place] [address]         Failed - Valid encounter within last 6 months    Recent Outpatient Visits          10 months ago Diabetes mellitus type 2 with complications ([place])   [place] Pickard, Cammie Mcgee, MD   1 year ago Benign essential HTN   [place] Pickard, Cammie Mcgee, MD   1 year ago [place] [facility] Schaumann, Cammie Mcgee, MD   2 years ago Benign essential HTN   [place] [name], MD   3 years ago Benign essential HTN   [place] [facility] Schaumann, Cammie Mcgee, MD             Passed - K in normal range and within 180 days    Potassium  Date Value Ref Range Status  11/22/2021 3.5 3.5 - 5.3 mmol/L Final         Passed - Ca in normal range and within 180 days    Calcium  Date Value Ref Range Status  11/22/2021 9.6 8.6 - 10.4 mg/dL Final   Calcium, Ion  Date Value Ref Range Status  03/30/2017 1.19 1.15 - 1.40 mmol/L Final         Passed - Na in normal range and within 180 days    Sodium  Date Value Ref Range Status  11/22/2021 139 135 - 146 mmol/L Final         Passed - Cr in normal range and within 180 days    Creat  Date Value Ref Range Status  11/22/2021 0.77 0.60 - 1.00 mg/dL Final         Passed - Cl in normal range and within 180 days    Chloride  Date Value Ref Range Status  11/22/2021 103 98 - 110 mmol/L Final         Passed - Last BP in normal range    BP Readings from Last 1 Encounters:   11/22/21 132/82

## 2021-12-14 ENCOUNTER — Other Ambulatory Visit (HOSPITAL_COMMUNITY): Payer: Self-pay

## 2021-12-19 ENCOUNTER — Other Ambulatory Visit (HOSPITAL_COMMUNITY): Payer: Self-pay

## 2021-12-20 ENCOUNTER — Other Ambulatory Visit (HOSPITAL_COMMUNITY): Payer: Self-pay

## 2021-12-27 ENCOUNTER — Encounter: Payer: Self-pay | Admitting: Family Medicine

## 2022-01-12 ENCOUNTER — Other Ambulatory Visit (HOSPITAL_COMMUNITY): Payer: Self-pay

## 2022-01-12 ENCOUNTER — Other Ambulatory Visit: Payer: Self-pay | Admitting: Family Medicine

## 2022-01-12 DIAGNOSIS — R053 Chronic cough: Secondary | ICD-10-CM

## 2022-01-12 MED ORDER — METFORMIN HCL 500 MG PO TABS
500.0000 mg | ORAL_TABLET | Freq: Two times a day (BID) | ORAL | 1 refills | Status: DC
  Filled 2022-01-12: qty 180, 90d supply, fill #0
  Filled 2022-04-09: qty 180, 90d supply, fill #1

## 2022-01-12 MED ORDER — PANTOPRAZOLE SODIUM 40 MG PO TBEC
40.0000 mg | DELAYED_RELEASE_TABLET | Freq: Two times a day (BID) | ORAL | 3 refills | Status: DC
  Filled 2022-01-12: qty 180, 90d supply, fill #0
  Filled 2022-04-09: qty 180, 90d supply, fill #1
  Filled 2022-06-28: qty 180, 90d supply, fill #2
  Filled 2022-09-21: qty 180, 90d supply, fill #3

## 2022-01-12 NOTE — Telephone Encounter (Signed)
Requested Prescriptions  Pending Prescriptions Disp Refills  . pantoprazole (PROTONIX) 40 MG tablet 180 tablet 3    Sig: Take 1 tablet (40 mg total) by mouth 2 (two) times daily.     Gastroenterology: Proton Pump Inhibitors Passed - 01/12/2022  3:54 PM      Passed - Valid encounter within last 12 months    Recent Outpatient Visits          11 months ago Diabetes mellitus type 2 with complications ([place])   [place] Pickard, Cammie Mcgee, MD   1 year ago Benign essential HTN   [place] Pickard, Cammie Mcgee, MD   1 year ago [place] [facility]ennard Schaumann, Cammie Mcgee, MD   2 years ago Benign essential HTN   [place] [name], MD   3 years ago Benign essential HTN   [place] [facility]enn[facility] Schaumann, Cammie Mcgee, MD             . metFORMIN (GLUCOPHAGE) 500 MG tablet 180 tablet 1    Sig: Take 1 tablet (500 mg total) by mouth 2 (two) times daily with a meal.     Endocrinology:  Diabetes - Biguanides Failed - 01/12/2022  3:54 PM      Failed - B12 Level in normal range and within 720 days    No results found for: "VITAMINB12"       Failed - Valid encounter within last 6 months    Recent Outpatient Visits          11 months ago Diabetes mellitus type 2 with complications ([place])   [place] Pickard, Cammie Mcgee, MD   1 year ago Benign essential HTN   [place] Pickard, Cammie Mcgee, MD   1 year ago [place] [facility]enn[facility] Schaumann, Cammie Mcgee, MD   2 years ago Benign essential HTN   [place] [name], MD   3 years ago Benign essential HTN   [place], Cammie Mcgee, MD             Passed - Cr in normal range and within 360 days    Creat  Date Value Ref Range Status  11/22/2021 0.77 0.60 - 1.00 mg/dL Final         Passed - HBA1C is between 0 and 7.9 and within 180 days    Hgb A1c MFr Bld  Date Value  Ref Range Status  11/22/2021 6.5 (H) <5.7 % of total Hgb Final    Comment:    For someone without known diabetes, a hemoglobin A1c value of 6.5% or greater indicates that they may have  diabetes and this should be confirmed with a follow-up  test. . For someone with known diabetes, a value <7% indicates  that their diabetes is well controlled and a value  greater than or equal to 7% indicates suboptimal  control. A1c targets should be individualized based on  duration of diabetes, age, comorbid conditions, and  other considerations. . Currently, no consensus exists regarding use of hemoglobin A1c for diagnosis of diabetes for children. .          Passed - eGFR in normal range and within 360 days    GFR, Est African American  Date Value Ref Range Status  08/10/2020 88 > OR = 60 mL/min/1.23m Final   GFR, Est Non African American  Date  Value Ref Range Status  08/10/2020 76 > OR = 60 mL/min/1.57m Final   eGFR  Date Value Ref Range Status  11/22/2021 78 > OR = 60 mL/min/1.730mFinal         Passed - CBC within normal limits and completed in the last 12 months    WBC  Date Value Ref Range Status  11/22/2021 6.2 3.8 - 10.8 Thousand/uL Final   RBC  Date Value Ref Range Status  11/22/2021 4.74 3.80 - 5.10 Million/uL Final   Hemoglobin  Date Value Ref Range Status  11/22/2021 13.0 11.7 - 15.5 g/dL Final   HGB  Date Value Ref Range Status  09/28/2009 12.5 11.6 - 15.9 g/dL Final   HCT  Date Value Ref Range Status  11/22/2021 39.6 35.0 - 45.0 % Final  09/28/2009 37.8 34.8 - 46.6 % Final   MCHC  Date Value Ref Range Status  11/22/2021 32.8 32.0 - 36.0 g/dL Final   [facility]  11/22/2021 27.4 27.0 - 33.0 pg Final   MCV  Date Value Ref Range Status  11/22/2021 83.5 80.0 - 100.0 fL Final  09/28/2009 86.7 79.5 - 101.0 fL Final   No results found for: "PLTCOUNTKUC", "LABPLAT", "POCPLA" RDW  Date Value Ref Range Status  11/22/2021 12.6 11.0  - 15.0 % Final  09/28/2009 13.4 11.2 - 14.5 % Final

## 2022-01-13 ENCOUNTER — Other Ambulatory Visit (HOSPITAL_COMMUNITY): Payer: Self-pay

## 2022-02-01 ENCOUNTER — Other Ambulatory Visit: Payer: Medicare PPO

## 2022-02-06 ENCOUNTER — Other Ambulatory Visit: Payer: Self-pay | Admitting: Family Medicine

## 2022-02-06 ENCOUNTER — Other Ambulatory Visit (HOSPITAL_COMMUNITY): Payer: Self-pay

## 2022-02-06 DIAGNOSIS — E782 Mixed hyperlipidemia: Secondary | ICD-10-CM

## 2022-02-07 ENCOUNTER — Other Ambulatory Visit: Payer: Medicare PPO

## 2022-02-07 ENCOUNTER — Other Ambulatory Visit (HOSPITAL_COMMUNITY): Payer: Self-pay

## 2022-02-07 ENCOUNTER — Telehealth: Payer: Self-pay | Admitting: Family Medicine

## 2022-02-07 DIAGNOSIS — E118 Type 2 diabetes mellitus with unspecified complications: Secondary | ICD-10-CM | POA: Diagnosis not present

## 2022-02-07 LAB — HEMOGLOBIN A1C
Hgb A1c MFr Bld: 7.3 % of total Hgb — ABNORMAL HIGH (ref <5.7)
Mean Plasma Glucose: 163 mg/dL
eAG (mmol/L): 9 mmol/L

## 2022-02-07 MED ORDER — AMLODIPINE BESYLATE 5 MG PO TABS
5.0000 mg | ORAL_TABLET | Freq: Every day | ORAL | 1 refills | Status: DC
  Filled 2022-02-07: qty 90, 90d supply, fill #0
  Filled 2022-05-09: qty 90, 90d supply, fill #1

## 2022-02-07 NOTE — Telephone Encounter (Signed)
Patient called to follow up on refill request for   atorvastatin (LIPITOR) 20 MG tablet   States it was denied.   Patient requesting call back with reason for denial.  Pharmacy confirmed as:  [facility] [address] [place] [address] Phone: [phone]  Fax: [phone] DEA #: [id number]  Pharmacy Comments: rf    Please advise at [phone].

## 2022-02-07 NOTE — Progress Notes (Unsigned)
ai

## 2022-02-08 ENCOUNTER — Other Ambulatory Visit (HOSPITAL_COMMUNITY): Payer: Self-pay

## 2022-02-08 ENCOUNTER — Other Ambulatory Visit: Payer: Self-pay

## 2022-02-08 DIAGNOSIS — E782 Mixed hyperlipidemia: Secondary | ICD-10-CM

## 2022-02-08 MED ORDER — ATORVASTATIN CALCIUM 20 MG PO TABS
20.0000 mg | ORAL_TABLET | Freq: Every day | ORAL | 3 refills | Status: DC
  Filled 2022-02-08: qty 90, 90d supply, fill #0
  Filled 2022-05-09: qty 90, 90d supply, fill #1
  Filled 2022-08-03: qty 90, 90d supply, fill #2
  Filled 2022-11-03 (×2): qty 90, 90d supply, fill #3

## 2022-02-09 ENCOUNTER — Other Ambulatory Visit: Payer: Self-pay

## 2022-02-09 ENCOUNTER — Telehealth: Payer: Self-pay

## 2022-02-09 MED ORDER — SITAGLIPTIN PHOSPHATE 100 MG PO TABS
100.0000 mg | ORAL_TABLET | Freq: Every day | ORAL | 1 refills | Status: DC
Start: 2022-02-09 — End: 2022-04-12
  Filled 2022-02-09: qty 30, 30d supply, fill #0
  Filled 2022-03-07: qty 30, 30d supply, fill #1

## 2022-02-09 NOTE — Telephone Encounter (Signed)
Per pt stated that she was taken a higher dose in the past ie '1000mg'$ , pt did not tolerate due to diarrhea. She don't think she can do the higher dose?  Please advice?

## 2022-02-10 ENCOUNTER — Other Ambulatory Visit (HOSPITAL_COMMUNITY): Payer: Self-pay

## 2022-02-15 ENCOUNTER — Encounter (INDEPENDENT_AMBULATORY_CARE_PROVIDER_SITE_OTHER): Payer: Self-pay

## 2022-02-20 ENCOUNTER — Other Ambulatory Visit (HOSPITAL_COMMUNITY): Payer: Self-pay

## 2022-02-24 ENCOUNTER — Telehealth: Payer: Self-pay

## 2022-02-24 ENCOUNTER — Ambulatory Visit (INDEPENDENT_AMBULATORY_CARE_PROVIDER_SITE_OTHER): Payer: Medicare PPO

## 2022-02-24 VITALS — Ht 67.0 in | Wt 195.0 lb

## 2022-02-24 DIAGNOSIS — Z0000 Encounter for general adult medical examination without abnormal findings: Secondary | ICD-10-CM | POA: Diagnosis not present

## 2022-02-24 NOTE — Patient Instructions (Signed)
Jennifer Wu , Thank you for taking time to come for your Medicare Wellness Visit. I appreciate your ongoing commitment to your health goals. Please review the following plan we discussed and let me know if I can assist you in the future.   These are the goals we discussed:  Goals      Have 3 meals a day     Stay healthy. Exercise more.         This is a list of the screening recommended for you and due dates:  Health Maintenance  Topic Date Due   Yearly kidney health urinalysis for diabetes  03/24/2022*   Eye exam for diabetics  03/24/2022*   COVID-19 Vaccine (3 - 2023-24 season) 03/24/2022*   Flu Shot  06/25/2022*   Mammogram  04/11/2022   Hemoglobin A1C  08/08/2022   Yearly kidney function blood test for diabetes  11/23/2022   Complete foot exam   11/23/2022   Medicare Annual Wellness Visit  02/25/2023   Colon Cancer Screening  03/30/2025   DTaP/Tdap/Td vaccine (3 - Td or Tdap) 02/11/2031   Pneumonia Vaccine  Completed   DEXA scan (bone density measurement)  Completed   HPV Vaccine  Aged Out   Zoster (Shingles) Vaccine  Discontinued  *Topic was postponed. The date shown is not the original due date.    Advanced directives: Please bring a copy of your health care power of attorney and living will to the office to be added to your chart at your convenience.   Conditions/risks identified: Aim for 30 minutes of exercise or brisk walking, 6-8 glasses of water, and 5 servings of fruits and vegetables each day.   Next appointment: Follow up in one year for your annual wellness visit 02/2023   Preventive Care 65 Years and Older, Female Preventive care refers to lifestyle choices and visits with your health care provider that can promote health and wellness. What does preventive care include? A yearly physical exam. This is also called an annual well check. Dental exams once or twice a year. Routine eye exams. Ask your health care provider how often you should have your eyes  checked. Personal lifestyle choices, including: Daily care of your teeth and gums. Regular physical activity. Eating a healthy diet. Avoiding tobacco and drug use. Limiting alcohol use. Practicing safe sex. Taking low-dose aspirin every day. Taking vitamin and mineral supplements as recommended by your health care provider. What happens during an annual well check? The services and screenings done by your health care provider during your annual well check will depend on your age, overall health, lifestyle risk factors, and family history of disease. Counseling  Your health care provider may ask you questions about your: Alcohol use. Tobacco use. Drug use. Emotional well-being. Home and relationship well-being. Sexual activity. Eating habits. History of falls. Memory and ability to understand (cognition). Work and work Statistician. Reproductive health. Screening  You may have the following tests or measurements: Height, weight, and BMI. Blood pressure. Lipid and cholesterol levels. These may be checked every 5 years, or more frequently if you are over 4 years old. Skin check. Lung cancer screening. You may have this screening every year starting at age 40 if you have a 30-pack-year history of smoking and currently smoke or have quit within the past 15 years. Fecal occult blood test (FOBT) of the stool. You may have this test every year starting at age 4. Flexible sigmoidoscopy or colonoscopy. You may have a sigmoidoscopy every 5 years or  a colonoscopy every 10 years starting at age 34. Hepatitis C blood test. Hepatitis B blood test. Sexually transmitted disease (STD) testing. Diabetes screening. This is done by checking your blood sugar (glucose) after you have not eaten for a while (fasting). You may have this done every 1-3 years. Bone density scan. This is done to screen for osteoporosis. You may have this done starting at age 73. Mammogram. This may be done every 1-2  years. Talk to your health care provider about how often you should have regular mammograms. Talk with your health care provider about your test results, treatment options, and if necessary, the need for more tests. Vaccines  Your health care provider may recommend certain vaccines, such as: Influenza vaccine. This is recommended every year. Tetanus, diphtheria, and acellular pertussis (Tdap, Td) vaccine. You may need a Td booster every 10 years. Zoster vaccine. You may need this after age 10. Pneumococcal 13-valent conjugate (PCV13) vaccine. One dose is recommended after age 12. Pneumococcal polysaccharide (PPSV23) vaccine. One dose is recommended after age 48. Talk to your health care provider about which screenings and vaccines you need and how often you need them. This information is not intended to replace advice given to you by your health care provider. Make sure you discuss any questions you have with your health care provider. Document Released: 04/09/2015 Document Revised: 12/01/2015 Document Reviewed: 01/12/2015 Elsevier Interactive Patient Education  2017 [place] Prevention in the Home Falls can cause injuries. They can happen to people of all ages. There are many things you can do to make your home safe and to help prevent falls. What can I do on the outside of my home? Regularly fix the edges of walkways and driveways and fix any cracks. Remove anything that might make you trip as you walk through a door, such as a raised step or threshold. Trim any bushes or trees on the path to your home. Use bright outdoor lighting. Clear any walking paths of anything that might make someone trip, such as rocks or tools. Regularly check to see if handrails are loose or broken. Make sure that both sides of any steps have handrails. Any raised decks and porches should have guardrails on the edges. Have any leaves, snow, or ice cleared regularly. Use sand or salt on walking paths  during winter. Clean up any spills in your garage right away. This includes oil or grease spills. What can I do in the bathroom? Use night lights. Install grab bars by the toilet and in the tub and shower. Do not use towel bars as grab bars. Use non-skid mats or decals in the tub or shower. If you need to sit down in the shower, use a plastic, non-slip stool. Keep the floor dry. Clean up any water that spills on the floor as soon as it happens. Remove soap buildup in the tub or shower regularly. Attach bath mats securely with double-sided non-slip rug tape. Do not have throw rugs and other things on the floor that can make you trip. What can I do in the bedroom? Use night lights. Make sure that you have a light by your bed that is easy to reach. Do not use any sheets or blankets that are too big for your bed. They should not hang down onto the floor. Have a firm chair that has side arms. You can use this for support while you get dressed. Do not have throw rugs and other things on the floor that  can make you trip. What can I do in the kitchen? Clean up any spills right away. Avoid walking on wet floors. Keep items that you use a lot in easy-to-reach places. If you need to reach something above you, use a strong step stool that has a grab bar. Keep electrical cords out of the way. Do not use floor polish or wax that makes floors slippery. If you must use wax, use non-skid floor wax. Do not have throw rugs and other things on the floor that can make you trip. What can I do with my stairs? Do not leave any items on the stairs. Make sure that there are handrails on both sides of the stairs and use them. Fix handrails that are broken or loose. Make sure that handrails are as long as the stairways. Check any carpeting to make sure that it is firmly attached to the stairs. Fix any carpet that is loose or worn. Avoid having throw rugs at the top or bottom of the stairs. If you do have throw  rugs, attach them to the floor with carpet tape. Make sure that you have a light switch at the top of the stairs and the bottom of the stairs. If you do not have them, ask someone to add them for you. What else can I do to help prevent falls? Wear shoes that: Do not have high heels. Have rubber bottoms. Are comfortable and fit you well. Are closed at the toe. Do not wear sandals. If you use a stepladder: Make sure that it is fully opened. Do not climb a closed stepladder. Make sure that both sides of the stepladder are locked into place. Ask someone to hold it for you, if possible. Clearly mark and make sure that you can see: Any grab bars or handrails. First and last steps. Where the edge of each step is. Use tools that help you move around (mobility aids) if they are needed. These include: Canes. Walkers. Scooters. Crutches. Turn on the lights when you go into a dark area. Replace any light bulbs as soon as they burn out. Set up your furniture so you have a clear path. Avoid moving your furniture around. If any of your floors are uneven, fix them. If there are any pets around you, be aware of where they are. Review your medicines with your doctor. Some medicines can make you feel dizzy. This can increase your chance of falling. Ask your doctor what other things that you can do to help prevent falls. This information is not intended to replace advice given to you by your health care provider. Make sure you discuss any questions you have with your health care provider. Document Released: 01/07/2009 Document Revised: 08/19/2015 Document Reviewed: 04/17/2014 Elsevier Interactive Patient Education  2017 [employer].

## 2022-02-24 NOTE — Telephone Encounter (Signed)
Did AWV today. Pt c/o still having issues with elevated blood sugars. Pt states she was on Metformin 500 mg 2 BID but it was decreased and Januvia was added. Pt states her readings are still in the 130's with both medications.

## 2022-02-24 NOTE — Progress Notes (Signed)
Subjective:   Jennifer Wu is a 81 y.o. female who presents for Medicare Annual (Subsequent) preventive examination. Virtual Visit via Telephone Note  I connected with  Jennifer Wu on 02/24/22 at  2:10 PM EST by telephone and verified that I am speaking with the correct person using two identifiers.  Location: Patient: HOME Provider: [facility] Persons participating in the virtual visit: patient/Nurse Health Advisor   I discussed the limitations, risks, security and privacy concerns of performing an evaluation and management service by telephone and the availability of in person appointments. The patient expressed understanding and agreed to proceed.  Interactive audio and video telecommunications were attempted between this nurse and patient, however failed, due to patient having technical difficulties OR patient did not have access to video capability.  We continued and completed visit with audio only.  Some vital signs may be absent or patient reported.   Jennifer Driver, LPN  Review of Systems    Cardiac Risk Factors include: advanced age (>29mn, >>67women);hypertension;dyslipidemia;obesity (BMI >30kg/m2);sedentary lifestyle     Objective:    Today's Vitals   02/24/22 1410  Weight: 202 lb (91.6 kg)  Height: '5\' 7"'$  (1.702 m)   Body mass index is 31.64 kg/m.     02/24/2022    2:20 PM 01/27/2021   11:14 AM 08/15/2020   11:02 PM 05/07/2020   10:15 AM 03/30/2017   12:27 PM 05/30/2011    3:58 PM 01/31/2011    1:01 PM  Advanced Directives  Does Patient Have a Medical Advance Directive? Yes No No Yes Yes Patient does not have advance directive Patient has advance directive, copy not in chart  Type of Advance Directive [place] will   [place] will HS[place] of facility DNR (pink MOST or yellow form)  Healthcare Power of Attorney  Does patient want to make changes to medical advance directive?    No -  Patient declined     Copy of [place]? No - copy requested   No - copy requested     Would patient like information on creating a medical advance directive?  No - Patient declined No - Patient declined        Current Medications (verified) Outpatient Encounter Medications as of 02/24/2022  Medication Sig   albuterol (VENTOLIN HFA) 108 (90 Base) MCG/ACT inhaler Inhale 1-2 puffs into the lungs every 6 (six) hours as needed for wheezing or shortness of breath.   amLODipine (NORVASC) 5 MG tablet Take 1 tablet (5 mg total) by mouth daily.   atorvastatin (LIPITOR) 20 MG tablet Take 1 tablet (20 mg total) by mouth daily.   cyclobenzaprine (FLEXERIL) 5 MG tablet Take 1 tablet (5 mg total) by mouth 3 (three) times daily as needed for muscle spasms.   furosemide (LASIX) 20 MG tablet Take 1 tablet (20 mg total) by mouth daily. Stop hydrochlorothiazide.   gabapentin (NEURONTIN) 300 MG capsule Take 1 capsule (300 mg total) by mouth 3 (three) times daily.   metFORMIN (GLUCOPHAGE) 500 MG tablet Take 1 tablet (500 mg total) by mouth 2 (two) times daily with a meal.   montelukast (SINGULAIR) 10 MG tablet Take 1 tablet (10 mg total) by mouth at bedtime.   pantoprazole (PROTONIX) 40 MG tablet Take 1 tablet (40 mg total) by mouth 2 (two) times daily.   potassium chloride SA (KLOR-CON) 20 MEQ tablet Take 1 tablet (20 mEq total) by mouth daily.  prednisoLONE acetate (PRED FORTE) 1 % ophthalmic suspension Place 1 drop into the right eye 4 (four) times daily as directed, please shake well before each use   pyridOXINE (VITAMIN B6) 100 MG tablet Take 100 mg by mouth daily.   sitaGLIPtin (JANUVIA) 100 MG tablet Take 1 tablet (100 mg total) by mouth daily.   VITAMIN D PO Take 1 capsule by mouth daily.   [DISCONTINUED] famotidine (PEPCID) 40 MG tablet TAKE 1 TABLET (40 MG TOTAL) BY MOUTH DAILY.   [DISCONTINUED] gatifloxacin (ZYMAXID) 0.5 % SOLN Place 1 drop into the right eye 4 (four) times  daily as directed   [DISCONTINUED] hydrochlorothiazide (HYDRODIURIL) 25 MG tablet TAKE 1 TABLET BY MOUTH DAILY.   [DISCONTINUED] ketorolac (ACULAR) 0.5 % ophthalmic solution Place 1 drop into the right eye 4 (four) times daily as directed (Patient not taking: Reported on 11/22/2021)   [DISCONTINUED] metFORMIN (GLUCOPHAGE) 500 MG tablet Take 1 tablet (500 mg total) by mouth 2 (two) times daily with a meal. (Patient not taking: Reported on 02/24/2022)   [DISCONTINUED] valsartan (DIOVAN) 160 MG tablet Take 1 tablet (160 mg total) by mouth daily. Quit amlodipine   No facility-administered encounter medications on file as of 02/24/2022.    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-had perminent nurve damage-chronic numbness-able to use now   Seasonal allergies    Vitamin D deficiency    Past Surgical History:  Procedure Laterality Date   BACK SURGERY     BREAST SURGERY     COLONOSCOPY     DILATION AND CURETTAGE OF UTERUS  1976   MAB   ELBOW SURGERY Left 01/26/2011   No BP lt arm due to titanum rod   ELBOW SURGERY     FRACTURE SURGERY  rt foot-1990   HYSTEROSCOPY WITH D & C  06/09/2011   Procedure: DILATATION AND CURETTAGE /HYSTEROSCOPY;  Surgeon: Cheri Fowler, MD;  Location: [facility];  Service: Gynecology;  Laterality: N/A;   KNEE SURGERY  10/12   right knee   LUMBAR LAMINECTOMY  1991   LYMPH NODE BIOPSY     right breast   RADIAL HEAD IMPLANT  02/01/2011   Procedure: RADIAL HEAD IMPLANT;  Surgeon: Schuyler Amor, MD;  Location: [facility];  Service: Orthopedics;  Laterality: Left;  left radial  head replacement   SVD     x 3   Family History  Problem Relation Age of Onset   Heart disease Mother        MI's   CAD Mother    Diabetes Mother    Alzheimer's disease Father    Alcohol abuse Father        cirrhosis   CAD Brother    Diabetes Brother    Heart disease Brother        MI's   Social History   Socioeconomic History   Marital status: Married    Spouse name: Glendell Docker   Number of children: 3   Years of education: Not on file   Highest education level: Not on file  Occupational History  Not on file  Tobacco Use   Smoking status: Never   Smokeless tobacco: Never  Vaping Use   Vaping Use: Never used  Substance and Sexual Activity   Alcohol use: No   Drug use: No   Sexual activity: Not on file  Other Topics Concern   Not on file  Social History Narrative   3 daughters   1 currently under treatment for uterine cancer-2022.   13 grandchildren   42 great grandchildren   Social Determinants of Health   Financial Resource Strain: Low Risk  (02/24/2022)   Overall Financial Resource Strain (CARDIA)    Difficulty of Paying Living Expenses: Not hard at all  Food Insecurity: No Food Insecurity (02/24/2022)   Hunger Vital Sign    Worried About Running Out of Food in the Last Year: Never true    Ran Out of Food in the Last Year: Never true  Transportation Needs: No Transportation Needs (02/24/2022)   PRAPARE - Hydrologist (Medical): No    Lack of Transportation (Non-Medical): No  Physical Activity: Insufficiently Active (02/24/2022)   Exercise Vital Sign    Days of Exercise per Week: 3 days    Minutes of Exercise per Session: 20 min  Stress: No Stress Concern Present (02/24/2022)   [place]    Feeling of Stress : Not at all  Social Connections: [place] (02/24/2022)   Social Connection and Isolation Panel [NHANES]    Frequency of Communication with Friends and  Family: More than three times a week    Frequency of Social Gatherings with Friends and Family: More than three times a week    Attends Religious Services: More than 4 times per year    Active Member of [employer] or Organizations: Yes    Attends Music therapist: More than 4 times per year    Marital Status: Married    Tobacco Counseling Counseling given: Not Answered   Clinical Intake:  Pre-visit preparation completed: Yes  Pain : No/denies pain     BMI - recorded: 31.64 Nutritional Status: BMI > 30  Obese Diabetes: No  How often do you need to have someone help you when you read instructions, pamphlets, or other written materials from your doctor or pharmacy?: 1 - Never  Diabetic?NO  Interpreter Needed?: No  Information entered by :: mj Jove Beyl, lpn   Activities of Daily Living    02/24/2022    2:22 PM  In your present state of health, do you have any difficulty performing the following activities:  Hearing? 0  Vision? 0  Difficulty concentrating or making decisions? 1  Comment Memory at times per pt.  Walking or climbing stairs? 0  Dressing or bathing? 0  Doing errands, shopping? 0  Preparing Food and eating ? N  Using the Toilet? N  In the past six months, have you accidently leaked urine? N  Do you have problems with loss of bowel control? N  Managing your Medications? N  Managing your Finances? N  Housekeeping or managing your Housekeeping? N    Patient Care Team: [name], MD as PCP - General (Family Medicine)  Indicate any recent Medical Services you may have received from other than [facility] providers in the past year (date may be approximate).     Assessment:   This is a routine wellness examination for [place].  Hearing/Vision screen Hearing Screening - Comments:: No hearing issues.  Vision  Screening - Comments:: Readers. Dr. Kerin Ransom and Dr. Tarri Glenn.   Dietary issues and exercise activities discussed: Current Exercise Habits:  Home exercise routine, Type of exercise: walking, Time (Minutes): 20, Frequency (Times/Week): 3, Weekly Exercise (Minutes/Week): 60, Intensity: Mild, Exercise limited by: cardiac condition(s);orthopedic condition(s)   Goals Addressed             This Visit's Progress    Have 3 meals a day   On track    Stay healthy. Exercise more.        Depression Screen    02/24/2022    2:17 PM 11/22/2021    9:46 AM 01/27/2021   11:02 AM 08/10/2020    8:57 AM 06/16/2019    8:26 AM 04/26/2017   10:41 AM  PHQ 2/9 Scores  PHQ - 2 Score 0 0 0 0 0 0    Fall Risk    02/24/2022    2:21 PM 11/22/2021    9:46 AM 01/27/2021   11:17 AM 08/10/2020    8:57 AM 06/16/2019    8:26 AM  Fall Risk   Falls in the past year? '1 1 1 1 '$ 0  Number falls in past yr: 0 0 0 1   Injury with Fall? 0 0 1 0   Comment   Broke shoulder in 4 places.    Risk for fall due to : History of fall(s);Impaired balance/gait History of fall(s) Impaired vision;History of fall(s) History of fall(s);Impaired balance/gait   Follow up Falls prevention discussed;Education provided Falls prevention discussed Falls prevention discussed Falls evaluation completed;Education provided;Falls prevention discussed Falls evaluation completed    FALL RISK PREVENTION PERTAINING TO THE HOME:  Any stairs in or around the home? Yes  If so, are there any without handrails? No  Home free of loose throw rugs in walkways, pet beds, electrical cords, etc? Yes  Adequate lighting in your home to reduce risk of falls? Yes   ASSISTIVE DEVICES UTILIZED TO PREVENT FALLS:  Life alert? No  Use of a cane, walker or w/c? No  Grab bars in the bathroom? Yes  Shower chair or bench in shower? Yes  Elevated toilet seat or a handicapped toilet? Yes   TIMED UP AND GO:  Was the test performed? No .  PHONE VISIT  Cognitive Function:        02/24/2022    2:23 PM 01/27/2021   11:31 AM  6CIT Screen  What Year? 0 points 0 points  What month? 0 points 0 points   What time? 0 points 3 points  Count back from 20 0 points 0 points  Months in reverse 0 points 0 points  Repeat phrase 0 points 0 points  Total Score 0 points 3 points    Immunizations Immunization History  Administered Date(s) Administered   Influenza Split 01/24/2012   Influenza-Unspecified 01/27/2021   PFIZER(Purple Top)SARS-COV-2 Vaccination 05/10/2019, 06/04/2019   PNEUMOCOCCAL CONJUGATE-20 08/10/2020   Pneumococcal Polysaccharide-23 01/24/2012   Tdap 01/23/2011, 02/10/2021    TDAP status: Up to date  Flu Vaccine status: Due, Education has been provided regarding the importance of this vaccine. Advised may receive this vaccine at local pharmacy or Health Dept. Aware to provide a copy of the vaccination record if obtained from local pharmacy or Health Dept. Verbalized acceptance and understanding.  Pneumococcal vaccine status: Up to date  Covid-19 vaccine status: Completed vaccines  Qualifies for Shingles Vaccine? Yes   Zostavax completed No   Shingrix Completed?: No.    Education has been provided regarding the  importance of this vaccine. Patient has been advised to call insurance company to determine out of pocket expense if they have not yet received this vaccine. Advised may also receive vaccine at local pharmacy or Health Dept. Verbalized acceptance and understanding.  Screening Tests Health Maintenance  Topic Date Due   Diabetic kidney evaluation - Urine ACR  03/24/2022 (Originally 02/10/1959)   OPHTHALMOLOGY EXAM  03/24/2022 (Originally 02/10/1951)   COVID-19 Vaccine (3 - 2023-24 season) 03/24/2022 (Originally 11/25/2021)   INFLUENZA VACCINE  06/25/2022 (Originally 10/25/2021)   MAMMOGRAM  04/11/2022   HEMOGLOBIN A1C  08/08/2022   Diabetic kidney evaluation - GFR measurement  11/23/2022   FOOT EXAM  11/23/2022   Medicare Annual Wellness (AWV)  02/25/2023   COLONOSCOPY (Pts 45-12yr Insurance coverage will need to be confirmed)  03/30/2025   DTaP/Tdap/Td (3 - Td or  Tdap) 02/11/2031   Pneumonia Vaccine 80 Years old  Completed   DEXA SCAN  Completed   HPV VACCINES  Aged Out   Zoster Vaccines- Shingrix  Discontinued    Health Maintenance  There are no preventive care reminders to display for this patient.   Colorectal cancer screening: No longer required.   Mammogram status: No longer required due to AGE.  Bone Density status: Completed 10/30/2018. Results reflect: Bone density results: OSTEOPENIA. Repeat every 2 years.  Lung Cancer Screening: (Low Dose CT Chest recommended if Age 80-80years, 30 pack-year currently smoking OR have quit w/in 15years.) does not qualify.   Lung Cancer Screening Referral: N/A  Additional Screening:  Hepatitis C Screening: does not qualify; Completed N/A  Vision Screening: Recommended annual ophthalmology exams for early detection of glaucoma and other disorders of the eye. Is the patient up to date with their annual eye exam?  Yes  Who is the provider or what is the name of the office in which the patient attends annual eye exams? DR  RKerin RansomIf pt is not established with a provider, would they like to be referred to a provider to establish care? No .   Dental Screening: Recommended annual dental exams for proper oral hygiene  Community Resource Referral / Chronic Care Management: CRR required this visit?  No   CCM required this visit?  No      Plan:     I have personally reviewed and noted the following in the patient's chart:   Medical and social history Use of alcohol, tobacco or illicit drugs  Current medications and supplements including opioid prescriptions. Patient is not currently taking opioid prescriptions. Functional ability and status Nutritional status Physical activity Advanced directives List of other physicians Hospitalizations, surgeries, and ER visits in previous 12 months Vitals Screenings to include cognitive, depression, and falls Referrals and appointments  In addition, I  have reviewed and discussed with patient certain preventive protocols, quality metrics, and best practice recommendations. A written personalized care plan for preventive services as well as general preventive health recommendations were provided to patient.     MChriss Driver LPN   [id number]  Nurse Notes: Pt states she was advised that she does not need the Shingrix vaccine because she has a hx of shingles.     I have collaborated with the care management provider regarding care management and care coordination activities outlined in this encounter and have reviewed this encounter including documentation in the note and care plan. I am certifying that I agree with the content of this note and encounter as supervising physician.

## 2022-02-27 NOTE — Addendum Note (Signed)
Addended by: Randal Buba K on: 02/27/2022 01:07 PM   Modules accepted: Orders

## 2022-03-07 ENCOUNTER — Ambulatory Visit: Payer: Medicare PPO | Admitting: Family Medicine

## 2022-03-08 ENCOUNTER — Other Ambulatory Visit: Payer: Self-pay

## 2022-03-08 ENCOUNTER — Other Ambulatory Visit (HOSPITAL_COMMUNITY): Payer: Self-pay

## 2022-03-13 ENCOUNTER — Other Ambulatory Visit (HOSPITAL_COMMUNITY): Payer: Self-pay

## 2022-03-13 ENCOUNTER — Ambulatory Visit: Payer: Medicare PPO | Admitting: Family Medicine

## 2022-03-13 VITALS — BP 136/72 | HR 76 | Ht 67.0 in | Wt 196.8 lb

## 2022-03-13 DIAGNOSIS — E118 Type 2 diabetes mellitus with unspecified complications: Secondary | ICD-10-CM

## 2022-03-13 DIAGNOSIS — Z23 Encounter for immunization: Secondary | ICD-10-CM | POA: Diagnosis not present

## 2022-03-13 MED ORDER — SEMAGLUTIDE(0.25 OR 0.5MG/DOS) 2 MG/3ML ~~LOC~~ SOPN
0.5000 mg | PEN_INJECTOR | SUBCUTANEOUS | 1 refills | Status: DC
  Filled 2022-03-13: qty 3, 28d supply, fill #0

## 2022-03-13 NOTE — Progress Notes (Signed)
Subjective:    Patient ID: Jennifer Wu, female    DOB: 1940/03/30, 81 y.o.   MRN: [id number]  HgA1c recently was elevated at 7.3 (Up from 6.5 in August).  Patient is unable to tolerate higher doses of metformin due to diarrhea.  Therefore she is having to take 500 mg twice daily.  We added Januvia about a month ago that her sugars remain elevated to greater than 150.  She has urinary frequency and does not want to take an SGLT2 receptor blocker Past Medical History:  Diagnosis Date   Arthritis    knees - otc med prn   Cancer ([place]) 2000-rt lumpectomy-no bp rt arm   Closed fracture of left proximal humerus    Diabetes mellitus type 2 with complications (HCC)    GERD (gastroesophageal reflux disease)    High cholesterol    Mixed dyslipidemia    Neuromuscular disorder ([place]) 1967-had a dr severe her lt radial nerve-had perminent nurve damage-chronic numbness-able to use now   Seasonal allergies    Vitamin D deficiency    Past Surgical History:  Procedure Laterality Date   BACK SURGERY     BREAST SURGERY     COLONOSCOPY     DILATION AND CURETTAGE OF UTERUS  1976   MAB   ELBOW SURGERY Left 01/26/2011   No BP lt arm due to titanum rod   ELBOW SURGERY     FRACTURE SURGERY  rt foot-1990   HYSTEROSCOPY WITH D & C  06/09/2011   Procedure: DILATATION AND CURETTAGE /HYSTEROSCOPY;  Surgeon: Cheri Fowler, MD;  Location: [facility];  Service: Gynecology;  Laterality: N/A;   KNEE SURGERY  10/12   right knee   LUMBAR LAMINECTOMY  1991   LYMPH NODE BIOPSY     right breast   RADIAL HEAD IMPLANT  02/01/2011   Procedure: RADIAL HEAD IMPLANT;  Surgeon: Schuyler Amor, MD;  Location: [place];  Service: Orthopedics;  Laterality: Left;  left radial head replacement   SVD     x 3   Current Outpatient Medications on File Prior to Visit  Medication Sig Dispense Refill   albuterol (VENTOLIN HFA) 108 (90 Base) MCG/ACT inhaler Inhale 1-2 puffs into the lungs every 6 (six) hours as  needed for wheezing or shortness of breath. 18 g 0   amLODipine (NORVASC) 5 MG tablet Take 1 tablet (5 mg total) by mouth daily. 90 tablet 1   atorvastatin (LIPITOR) 20 MG tablet Take 1 tablet (20 mg total) by mouth daily. 90 tablet 3   cyclobenzaprine (FLEXERIL) 5 MG tablet Take 1 tablet (5 mg total) by mouth 3 (three) times daily as needed for muscle spasms. 90 tablet 1   famotidine (PEPCID) 40 MG tablet Take 40 mg by mouth daily.     furosemide (LASIX) 20 MG tablet Take 1 tablet (20 mg total) by mouth daily. Stop hydrochlorothiazide. 30 tablet 2   metFORMIN (GLUCOPHAGE) 500 MG tablet Take 1 tablet (500 mg total) by mouth 2 (two) times daily with a meal. 180 tablet 1   montelukast (SINGULAIR) 10 MG tablet Take 1 tablet (10 mg total) by mouth at bedtime. 30 tablet 11   pantoprazole (PROTONIX) 40 MG tablet Take 1 tablet (40 mg total) by mouth 2 (two) times daily. 180 tablet 3   pyridOXINE (VITAMIN B6) 100 MG tablet Take 100 mg by mouth daily.     sitaGLIPtin (JANUVIA) 100 MG tablet Take 1 tablet (100 mg total) by mouth daily.  30 tablet 1   VITAMIN D PO Take 1 capsule by mouth daily.     [DISCONTINUED] hydrochlorothiazide (HYDRODIURIL) 25 MG tablet TAKE 1 TABLET BY MOUTH DAILY. 90 tablet 1   [DISCONTINUED] valsartan (DIOVAN) 160 MG tablet Take 1 tablet (160 mg total) by mouth daily. Quit amlodipine 90 tablet 3   No current facility-administered medications on file prior to visit.   Allergies  Allergen Reactions   Lisinopril Nausea And Vomiting   Social History   Socioeconomic History   Marital status: Married    Spouse name: Glendell Docker   Number of children: 3   Years of education: Not on file   Highest education level: Not on file  Occupational History   Not on file  Tobacco Use   Smoking status: Never   Smokeless tobacco: Never  Vaping Use   Vaping Use: Never used  Substance and Sexual Activity   Alcohol use: No   Drug use: No   Sexual activity: Not on file  Other Topics Concern    Not on file  Social History Narrative   3 daughters   1 currently under treatment for uterine cancer-2022.   13 grandchildren   27 great grandchildren   Social Determinants of Health   Financial Resource Strain: Low Risk  (02/24/2022)   Overall Financial Resource Strain (CARDIA)    Difficulty of Paying Living Expenses: Not hard at all  Food Insecurity: No Food Insecurity (02/24/2022)   Hunger Vital Sign    Worried About Running Out of Food in the Last Year: Never true    Ran Out of Food in the Last Year: Never true  Transportation Needs: No Transportation Needs (02/24/2022)   PRAPARE - Hydrologist (Medical): No    Lack of Transportation (Non-Medical): No  Physical Activity: Insufficiently Active (02/24/2022)   Exercise Vital Sign    Days of Exercise per Week: 3 days    Minutes of Exercise per Session: 20 min  Stress: No Stress Concern Present (02/24/2022)   [place]    Feeling of Stress : Not at all  Social Connections: [place] (02/24/2022)   Social Connection and Isolation Panel [NHANES]    Frequency of Communication with Friends and Family: More than three times a week    Frequency of Social Gatherings with Friends and Family: More than three times a week    Attends Religious Services: More than 4 times per year    Active Member of [employer] or Organizations: Yes    Attends Archivist Meetings: More than 4 times per year    Marital Status: Married  Human resources officer Violence: Not At Risk (02/24/2022)   Humiliation, Afraid, Rape, and Kick questionnaire    Fear of Current or Ex-Partner: No    Emotionally Abused: No    Physically Abused: No    Sexually Abused: No      Review of Systems  All other systems reviewed and are negative.      Objective:   Physical Exam Vitals reviewed.  Constitutional:      General: She is not in acute distress.    Appearance: She  is well-developed. She is not diaphoretic.  Neck:     Thyroid: No thyromegaly.     Vascular: No JVD.  Cardiovascular:     Rate and Rhythm: Normal rate and regular rhythm.     Heart sounds: Murmur heard.  Pulmonary:     Effort: Pulmonary  effort is normal. No respiratory distress.     Breath sounds: Normal breath sounds. No wheezing or rales.  Abdominal:     General: Bowel sounds are normal. There is no distension.     Palpations: Abdomen is soft.     Tenderness: There is no abdominal tenderness. There is no rebound.           Assessment & Plan:  Diabetes mellitus type 2 with complications (HCC) Discontinue Januvia and replace with Ozempic 0.5 mg subcu weekly.  Uptitrate monthly to maximum strength.  Continue metformin 500 mg twice daily.  Reassess in 3 months.

## 2022-03-14 ENCOUNTER — Ambulatory Visit: Payer: Medicare PPO | Admitting: Family Medicine

## 2022-04-09 ENCOUNTER — Other Ambulatory Visit: Payer: Self-pay | Admitting: Family Medicine

## 2022-04-10 ENCOUNTER — Other Ambulatory Visit (HOSPITAL_COMMUNITY): Payer: Self-pay

## 2022-04-10 MED ORDER — FUROSEMIDE 20 MG PO TABS
20.0000 mg | ORAL_TABLET | Freq: Every day | ORAL | 0 refills | Status: DC
  Filled 2022-04-10: qty 90, 90d supply, fill #0

## 2022-04-10 NOTE — Telephone Encounter (Signed)
Requested Prescriptions  Pending Prescriptions Disp Refills   furosemide (LASIX) 20 MG tablet 90 tablet 0    Sig: Take 1 tablet (20 mg total) by mouth daily. Stop hydrochlorothiazide.     Cardiovascular:  Diuretics - Loop Failed - 04/09/2022 10:29 PM      Failed - Mg Level in normal range and within 180 days    Magnesium  Date Value Ref Range Status  05/09/2020 2.3 1.7 - 2.4 mg/dL Final    Comment:    Performed at [facility], [place] [address] [place], [place] [address]         Failed - Valid encounter within last 6 months    Recent Outpatient Visits           1 year ago Diabetes mellitus type 2 with complications ([place])   [place] Pickard, Cammie Mcgee, MD   1 year ago Benign essential HTN   [place] Pickard, Cammie Mcgee, MD   1 year ago [place] Dennard Schaumann, Cammie Mcgee, MD   2 years ago Benign essential HTN   [place] [name], MD   3 years ago Benign essential HTN   [place] Dennard Schaumann, Cammie Mcgee, MD              Passed - K in normal range and within 180 days    Potassium  Date Value Ref Range Status  11/22/2021 3.5 3.5 - 5.3 mmol/L Final         Passed - Ca in normal range and within 180 days    Calcium  Date Value Ref Range Status  11/22/2021 9.6 8.6 - 10.4 mg/dL Final   Calcium, Ion  Date Value Ref Range Status  03/30/2017 1.19 1.15 - 1.40 mmol/L Final         Passed - Na in normal range and within 180 days    Sodium  Date Value Ref Range Status  11/22/2021 139 135 - 146 mmol/L Final         Passed - Cr in normal range and within 180 days    Creat  Date Value Ref Range Status  11/22/2021 0.77 0.60 - 1.00 mg/dL Final         Passed - Cl in normal range and within 180 days    Chloride  Date Value Ref Range Status  11/22/2021 103 98 - 110 mmol/L Final         Passed - Last BP in normal range    BP Readings from Last 1 Encounters:   03/13/22 136/72

## 2022-04-11 ENCOUNTER — Other Ambulatory Visit (HOSPITAL_COMMUNITY): Payer: Self-pay

## 2022-04-12 ENCOUNTER — Other Ambulatory Visit: Payer: Self-pay

## 2022-04-12 ENCOUNTER — Other Ambulatory Visit (HOSPITAL_COMMUNITY): Payer: Self-pay

## 2022-04-12 ENCOUNTER — Telehealth: Payer: Self-pay | Admitting: Family Medicine

## 2022-04-12 DIAGNOSIS — E118 Type 2 diabetes mellitus with unspecified complications: Secondary | ICD-10-CM

## 2022-04-12 LAB — HM MAMMOGRAPHY

## 2022-04-12 MED ORDER — SEMAGLUTIDE (1 MG/DOSE) 4 MG/3ML ~~LOC~~ SOPN
1.0000 mg | PEN_INJECTOR | SUBCUTANEOUS | 1 refills | Status: DC
  Filled 2022-04-12: qty 3, 28d supply, fill #0
  Filled 2022-05-09: qty 3, 28d supply, fill #1

## 2022-04-12 NOTE — Telephone Encounter (Signed)
Patient called to report she's doing well using the [facility]; her numbers have been 124, 125, and 117 first thing in the morning.  Stated she has one more dose which she'll take on Friday.   Requesting call back to find out if Dr. Dennard Schaumann wants her to continue taking it. If so, she'll need a refill.  Pharmacy confirmed as:   [place] [address] [place] [address] Phone: [phone]  Fax: [phone] DEA #: [id number]   Please advise at [phone].

## 2022-04-13 ENCOUNTER — Other Ambulatory Visit (HOSPITAL_COMMUNITY): Payer: Self-pay

## 2022-04-20 ENCOUNTER — Other Ambulatory Visit (HOSPITAL_COMMUNITY): Payer: Self-pay

## 2022-04-25 ENCOUNTER — Other Ambulatory Visit: Payer: Self-pay | Admitting: Radiology

## 2022-05-04 LAB — HM DEXA SCAN

## 2022-05-09 ENCOUNTER — Telehealth: Payer: Self-pay

## 2022-05-09 ENCOUNTER — Other Ambulatory Visit: Payer: Self-pay | Admitting: Family Medicine

## 2022-05-09 ENCOUNTER — Other Ambulatory Visit: Payer: Self-pay

## 2022-05-09 NOTE — Telephone Encounter (Signed)
Pt has a D&C scheduled for removal of a uterine polyp. Pt is concerned about having the surgery and asks if you think she would be okay to proceed? Pt also asks if she should stop her Ozempic prior to the surgery? Thank you.

## 2022-05-10 NOTE — Telephone Encounter (Signed)
Unable to refill per protocol, Rx request is too soon, Last refill 04/10/22 for 90 days.  Requested Prescriptions  Pending Prescriptions Disp Refills   furosemide (LASIX) 20 MG tablet 90 tablet 0    Sig: Take 1 tablet (20 mg total) by mouth daily. Stop hydrochlorothiazide.     Cardiovascular:  Diuretics - Loop Failed - 05/09/2022 10:28 AM      Failed - Mg Level in normal range and within 180 days    Magnesium  Date Value Ref Range Status  05/09/2020 2.3 1.7 - 2.4 mg/dL Final    Comment:    Performed at [facility], [place] [address], [place], [place] [address]         Failed - Valid encounter within last 6 months    Recent Outpatient Visits           1 year ago Diabetes mellitus type 2 with complications ([place])   [place] Pickard, Cammie Mcgee, MD   1 year ago Benign essential HTN   [place] Pickard, Cammie Mcgee, MD   1 year ago [place] Dennard Schaumann, Cammie Mcgee, MD   2 years ago Benign essential HTN   [place] [name], MD   4 years ago Benign essential HTN   [place] Pickard, Cammie Mcgee, MD       Future Appointments             In 2 months Wannetta Sender Governor Rooks, MD [facility]rogynecology at [place] [facility], [place] - K in normal range and within 180 days    Potassium  Date Value Ref Range Status  11/22/2021 3.5 3.5 - 5.3 mmol/L Final         Passed - Ca in normal range and within 180 days    Calcium  Date Value Ref Range Status  11/22/2021 9.6 8.6 - 10.4 mg/dL Final   Calcium, Ion  Date Value Ref Range Status  03/30/2017 1.19 1.15 - 1.40 mmol/L Final         Passed - Na in normal range and within 180 days    Sodium  Date Value Ref Range Status  11/22/2021 139 135 - 146 mmol/L Final         Passed - Cr in normal range and within 180 days    Creat  Date Value Ref Range Status  11/22/2021 0.77 0.60 - 1.00 mg/dL Final          Passed - Cl in normal range and within 180 days    Chloride  Date Value Ref Range Status  11/22/2021 103 98 - 110 mmol/L Final         Passed - Last BP in normal range    BP Readings from Last 1 Encounters:  03/13/22 136/72

## 2022-05-12 ENCOUNTER — Other Ambulatory Visit (HOSPITAL_COMMUNITY): Payer: Self-pay

## 2022-05-15 ENCOUNTER — Encounter: Payer: Self-pay | Admitting: *Deleted

## 2022-05-19 ENCOUNTER — Encounter: Payer: Self-pay | Admitting: Family Medicine

## 2022-05-19 LAB — HM MAMMOGRAPHY

## 2022-05-23 ENCOUNTER — Encounter: Payer: Self-pay | Admitting: Family Medicine

## 2022-05-29 ENCOUNTER — Encounter: Payer: Self-pay | Admitting: Family Medicine

## 2022-05-29 ENCOUNTER — Other Ambulatory Visit: Payer: Self-pay | Admitting: Family Medicine

## 2022-05-30 ENCOUNTER — Other Ambulatory Visit: Payer: Self-pay

## 2022-05-30 ENCOUNTER — Other Ambulatory Visit (HOSPITAL_COMMUNITY): Payer: Self-pay

## 2022-05-30 MED ORDER — VITAMIN B-6 100 MG PO TABS
100.0000 mg | ORAL_TABLET | Freq: Every day | ORAL | 3 refills | Status: AC
  Filled 2022-05-30 – 2022-07-14 (×2): qty 30, 30d supply, fill #0
  Filled 2023-04-19: qty 100, 100d supply, fill #0
  Filled 2023-04-24: qty 30, 30d supply, fill #0

## 2022-06-01 ENCOUNTER — Other Ambulatory Visit: Payer: Self-pay | Admitting: Family Medicine

## 2022-06-01 DIAGNOSIS — E118 Type 2 diabetes mellitus with unspecified complications: Secondary | ICD-10-CM

## 2022-06-02 MED ORDER — OZEMPIC (1 MG/DOSE) 4 MG/3ML ~~LOC~~ SOPN
1.0000 mg | PEN_INJECTOR | SUBCUTANEOUS | 1 refills | Status: DC
  Filled 2022-06-02: qty 3, 28d supply, fill #0
  Filled 2022-07-14: qty 3, 28d supply, fill #1

## 2022-06-02 NOTE — Telephone Encounter (Signed)
Requested medication (s) are due for refill today: yes  Requested medication (s) are on the active medication list: yes  Last refill:  04/12/22  Future visit scheduled: yes  Notes to clinic:  Unable to refill per protocol due to failed labs, no updated results.      Requested Prescriptions  Pending Prescriptions Disp Refills   Semaglutide, 1 MG/DOSE, (OZEMPIC, 1 MG/DOSE,) 4 MG/3ML SOPN 3 mL 1    Sig: Inject 1 mg as directed once a week.     Endocrinology:  Diabetes - GLP-1 Receptor Agonists - semaglutide Failed - 06/02/2022  8:12 AM      Failed - HBA1C in normal range and within 180 days    Hgb A1c MFr Bld  Date Value Ref Range Status  02/07/2022 7.3 (H) <5.7 % of total Hgb Final    Comment:    For someone without known diabetes, a hemoglobin A1c value of 6.5% or greater indicates that they may have  diabetes and this should be confirmed with a follow-up  test. . For someone with known diabetes, a value <7% indicates  that their diabetes is well controlled and a value  greater than or equal to 7% indicates suboptimal  control. A1c targets should be individualized based on  duration of diabetes, age, comorbid conditions, and  other considerations. . Currently, no consensus exists regarding use of hemoglobin A1c for diagnosis of diabetes for children. .          Failed - Valid encounter within last 6 months    Recent Outpatient Visits           1 year ago Diabetes mellitus type 2 with complications ([place])   [place] Pickard, Cammie Mcgee, MD   1 year ago Benign essential HTN   [place] Pickard, Cammie Mcgee, MD   2 years ago [place] Dennard Schaumann, Cammie Mcgee, MD   2 years ago Benign essential HTN   [place] Dennard Schaumann Cammie Mcgee, MD   4 years ago Benign essential HTN   [place], Cammie Mcgee, MD       Future Appointments             In 1 month Jaquita Folds,  MD [facility]ro[facility] at [place] [facility], [place] - Cr in normal range and within 360 days    Creat  Date Value Ref Range Status  11/22/2021 0.77 0.60 - 1.00 mg/dL Final

## 2022-06-02 NOTE — Telephone Encounter (Signed)
Patient's daughter called to follow up on refill request for Semaglutide, 1 MG/DOSE, 4 MG/3ML SOPN [id number]  **PHARMACY needs a new script.**  Patient going out of town today and needs the refill before she leaves.   Pharmacy confirmed as [facility] [address] [place] [address] Phone: [phone]  Fax: [phone] DEA #: [id number]   Please advise at [phone].

## 2022-06-03 ENCOUNTER — Other Ambulatory Visit (HOSPITAL_COMMUNITY): Payer: Self-pay

## 2022-06-05 ENCOUNTER — Other Ambulatory Visit (HOSPITAL_COMMUNITY): Payer: Self-pay

## 2022-06-14 ENCOUNTER — Encounter: Payer: Self-pay | Admitting: Family Medicine

## 2022-06-22 ENCOUNTER — Encounter: Payer: Self-pay | Admitting: Family Medicine

## 2022-06-27 ENCOUNTER — Encounter (HOSPITAL_BASED_OUTPATIENT_CLINIC_OR_DEPARTMENT_OTHER): Payer: Self-pay | Admitting: Obstetrics and Gynecology

## 2022-06-27 NOTE — Progress Notes (Addendum)
Spoke w/ via phone for pre-op interview--- Jennifer Wu needs dos----    CBC and BMP per Psychologist, sport and exercise. EKG per anesthesia          Wu results------ COVID test -----patient states asymptomatic no test needed Arrive at -------0700 NPO after MN NO Solid Food.   Med rec completed Medications to take morning of surgery ----- Norvasc and Protonix. Diabetic medication -----No DM AM of procedure verbalized understanding Patient instructed no nail polish to be worn day of surgery Patient instructed to bring photo id and insurance card day of surgery Patient aware to have Driver (ride )[employer]   for 24 hours after surgery  Patient Special Instructions ----- Pre-Op special Istructions ----- Patient verbalized understanding of instructions that were given at this phone interview. Patient denies shortness of breath, chest pain, fever, cough at this phone interview.  Patient last dose Ozempic 06/20/22. Verbalized understanding to not take any more doses of Ozempic until after surgical procedure.

## 2022-06-28 ENCOUNTER — Other Ambulatory Visit: Payer: Self-pay | Admitting: Family Medicine

## 2022-06-28 ENCOUNTER — Other Ambulatory Visit (HOSPITAL_COMMUNITY): Payer: Self-pay

## 2022-06-28 NOTE — Telephone Encounter (Signed)
Requested medication (s) are due for refill today: yes  Requested medication (s) are on the active medication list: yes  Last refill:  11/24/21 #90/1  Future visit scheduled: no  Notes to clinic:  no delegated, LOV 03/13/22     Requested Prescriptions  Pending Prescriptions Disp Refills   cyclobenzaprine (FLEXERIL) 5 MG tablet 90 tablet 1    Sig: Take 1 tablet (5 mg total) by mouth 3 (three) times daily as needed for muscle spasms.     Not Delegated - Analgesics:  Muscle Relaxants Failed - 06/28/2022  4:45 PM      Failed - This refill cannot be delegated      Failed - Valid encounter within last 6 months    Recent Outpatient Visits           1 year ago Diabetes mellitus type 2 with complications ([place])   [place] Pickard, Cammie Mcgee, MD   1 year ago Benign essential HTN   [place] Pickard, Cammie Mcgee, MD   2 years ago [place] Dennard Schaumann, Cammie Mcgee, MD   3 years ago Benign essential HTN   [place] [name], MD   4 years ago Benign essential HTN   [place], Cammie Mcgee, MD       Future Appointments             In 3 weeks Wannetta Sender, Governor Rooks, MD [facility] Urogynecology at [place] [facility], [facility]

## 2022-06-29 ENCOUNTER — Other Ambulatory Visit: Payer: Self-pay

## 2022-06-29 ENCOUNTER — Other Ambulatory Visit (HOSPITAL_COMMUNITY): Payer: Self-pay

## 2022-06-29 MED ORDER — FUROSEMIDE 20 MG PO TABS
20.0000 mg | ORAL_TABLET | Freq: Every day | ORAL | 0 refills | Status: DC
  Filled 2022-06-29: qty 90, 90d supply, fill #0

## 2022-06-29 MED ORDER — METFORMIN HCL 500 MG PO TABS
500.0000 mg | ORAL_TABLET | Freq: Two times a day (BID) | ORAL | 0 refills | Status: DC
  Filled 2022-06-29: qty 180, 90d supply, fill #0

## 2022-06-29 MED ORDER — CYCLOBENZAPRINE HCL 5 MG PO TABS
5.0000 mg | ORAL_TABLET | Freq: Three times a day (TID) | ORAL | 1 refills | Status: DC | PRN
  Filled 2022-06-29: qty 90, 30d supply, fill #0
  Filled 2022-08-26 – 2022-08-28 (×2): qty 90, 30d supply, fill #1

## 2022-06-29 NOTE — Telephone Encounter (Signed)
Requested Prescriptions  Pending Prescriptions Disp Refills   metFORMIN (GLUCOPHAGE) 500 MG tablet 180 tablet 0    Sig: Take 1 tablet (500 mg total) by mouth 2 (two) times daily with a meal.     Endocrinology:  Diabetes - Biguanides Failed - 06/28/2022  4:45 PM      Failed - B12 Level in normal range and within 720 days    No results found for: "VITAMINB12"       Failed - Valid encounter within last 6 months    Recent Outpatient Visits           1 year ago Diabetes mellitus type 2 with complications ([place])   [place] Pickard, Cammie Mcgee, MD   1 year ago Benign essential HTN   [place] Pickard, Cammie Mcgee, MD   2 years ago [place] Dennard Schaumann, Cammie Mcgee, MD   3 years ago Benign essential HTN   [place] Dennard Schaumann, Cammie Mcgee, MD   4 years ago Benign essential HTN   [place], Cammie Mcgee, MD       Future Appointments             In 3 weeks Jaquita Folds, MD [facility] Health Urogynecology at [place] for Women, [place] - Cr in normal range and within 360 days    Creat  Date Value Ref Range Status  11/22/2021 0.77 0.60 - 1.00 mg/dL Final         Passed - HBA1C is between 0 and 7.9 and within 180 days    Hgb A1c MFr Bld  Date Value Ref Range Status  02/07/2022 7.3 (H) <5.7 % of total Hgb Final    Comment:    For someone without known diabetes, a hemoglobin A1c value of 6.5% or greater indicates that they may have  diabetes and this should be confirmed with a follow-up  test. . For someone with known diabetes, a value <7% indicates  that their diabetes is well controlled and a value  greater than or equal to 7% indicates suboptimal  control. A1c targets should be individualized based on  duration of diabetes, age, comorbid conditions, and  other considerations. . Currently, no consensus exists regarding use of hemoglobin A1c for diagnosis of  diabetes for children. .          Passed - eGFR in normal range and within 360 days    GFR, Est African American  Date Value Ref Range Status  08/10/2020 88 > OR = 60 mL/min/1.15m2 Final   GFR, Est Non African American  Date Value Ref Range Status  08/10/2020 76 > OR = 60 mL/min/1.13m2 Final   eGFR  Date Value Ref Range Status  11/22/2021 78 > OR = 60 mL/min/1.84m2 Final         Passed - CBC within normal limits and completed in the last 12 months    WBC  Date Value Ref Range Status  11/22/2021 6.2 3.8 - 10.8 Thousand/uL Final   RBC  Date Value Ref Range Status  11/22/2021 4.74 3.80 - 5.10 Million/uL Final   Hemoglobin  Date Value Ref Range Status  11/22/2021 13.0 11.7 - 15.5 g/dL Final   HGB  Date Value Ref Range Status  09/28/2009 12.5 11.6 - 15.9 g/dL Final   HCT  Date Value Ref Range Status  11/22/2021 39.6 35.0 -  45.0 % Final  09/28/2009 37.8 34.8 - 46.6 % Final   MCHC  Date Value Ref Range Status  11/22/2021 32.8 32.0 - 36.0 g/dL Final   [facility]  Date Value Ref Range Status  11/22/2021 27.4 27.0 - 33.0 pg Final   MCV  Date Value Ref Range Status  11/22/2021 83.5 80.0 - 100.0 fL Final  09/28/2009 86.7 79.5 - 101.0 fL Final   No results found for: "PLTCOUNTKUC", "LABPLAT", "POCPLA" RDW  Date Value Ref Range Status  11/22/2021 12.6 11.0 - 15.0 % Final  09/28/2009 13.4 11.2 - 14.5 % Final          furosemide (LASIX) 20 MG tablet 90 tablet 0    Sig: Take 1 tablet (20 mg total) by mouth daily. Stop hydrochlorothiazide.     Cardiovascular:  Diuretics - Loop Failed - 06/28/2022  4:45 PM      Failed - K in normal range and within 180 days    Potassium  Date Value Ref Range Status  11/22/2021 3.5 3.5 - 5.3 mmol/L Final         Failed - Ca in normal range and within 180 days    Calcium  Date Value Ref Range Status  11/22/2021 9.6 8.6 - 10.4 mg/dL Final   Calcium, Ion  Date Value Ref Range Status  03/30/2017 1.19 1.15 - 1.40 mmol/L Final          Failed - Na in normal range and within 180 days    Sodium  Date Value Ref Range Status  11/22/2021 139 135 - 146 mmol/L Final         Failed - Cr in normal range and within 180 days    Creat  Date Value Ref Range Status  11/22/2021 0.77 0.60 - 1.00 mg/dL Final         Failed - Cl in normal range and within 180 days    Chloride  Date Value Ref Range Status  11/22/2021 103 98 - 110 mmol/L Final         Failed - Mg Level in normal range and within 180 days    Magnesium  Date Value Ref Range Status  05/09/2020 2.3 1.7 - 2.4 mg/dL Final    Comment:    Performed at [facility], [place] [address] [place], [place] [address]         Failed - Valid encounter within last 6 months    Recent Outpatient Visits           1 year ago Diabetes mellitus type 2 with complications ([place])   [place] Pickard, Cammie Mcgee, MD   1 year ago Benign essential HTN   [place] Pickard, Cammie Mcgee, MD   2 years ago [place] Dennard Schaumann, Cammie Mcgee, MD   3 years ago Benign essential HTN   [place] Dennard Schaumann Cammie Mcgee, MD   4 years ago Benign essential HTN   [place], Cammie Mcgee, MD       Future Appointments             In 3 weeks Jaquita Folds, MD [facility] Urogynecology at [place] for Women, [place] - Last BP in normal range    BP Readings from Last 1 Encounters:  03/13/22 136/72

## 2022-07-05 NOTE — H&P (Signed)
Jennifer Wu is an 82 y.o. female. She has a known large endometrial polyp for a few years, benign pipelle in 2022 with benign polyp, benign endocervical polyp removed in 2022.  Ultrasound continues to demonstrate a thickened endometrium consistent with polyp.  She has no current vaginal bleeding.  Her daughter has been diagnosed with endometrial cancer, no although she has declined to have the polyp removed in the past, she would now like it removed to document it is benign and make further evaluation easier if necessary  Pertinent Gynecological History: Last mammogram: normal Date: 2024 Last pap: normal Date: years ago OB History: G4, P3013 SVD x 3   Menstrual History: No LMP recorded. Patient is postmenopausal.    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   Seasonal allergies    Vitamin D deficiency     Past Surgical History:  Procedure Laterality Date   BACK SURGERY     BREAST SURGERY     COLONOSCOPY     DILATION AND CURETTAGE OF UTERUS  1976   MAB   ELBOW SURGERY Left 01/26/2011   No BP lt arm due to titanum rod   ELBOW SURGERY     FRACTURE SURGERY  rt foot-1990   HYSTEROSCOPY WITH D & C  06/09/2011   Procedure: DILATATION AND CURETTAGE /HYSTEROSCOPY;  Surgeon: Lavina Hamman, MD;  Location: [facility];  Service: Gynecology;  Laterality: N/A;   KNEE SURGERY  10/12   right knee   LUMBAR LAMINECTOMY  1991   LYMPH NODE BIOPSY     right breast   RADIAL HEAD IMPLANT  02/01/2011   Procedure: RADIAL HEAD IMPLANT;  Surgeon: Marlowe Shores, MD;  Location: [facility];  Service:  Orthopedics;  Laterality: Left;  left radial head replacement   SVD     x 3    Family History  Problem Relation Age of Onset   Heart disease Mother        MI's   CAD Mother    Diabetes Mother    Alzheimer's disease Father    Alcohol abuse Father        cirrhosis   CAD Brother    Diabetes Brother    Heart disease Brother        MI's    Social History:  reports that she has never smoked. She has never used smokeless tobacco. She reports that she does not drink alcohol and does not use drugs.  Allergies:  Allergies  Allergen Reactions   Lisinopril Nausea And Vomiting    No medications prior to admission.    Review of Systems  Respiratory: Negative.    Cardiovascular: Negative.     Height 5\' 5"  (1.651 m), weight 81.6 kg. Physical Exam Constitutional:      Appearance: Normal appearance.  Cardiovascular:     Rate and Rhythm: Normal rate and regular rhythm.     Heart sounds: Normal heart sounds. No murmur heard. Pulmonary:     Effort: Pulmonary effort is normal. No respiratory distress.     Breath  sounds: Normal breath sounds. No wheezing.  Abdominal:     General: There is no distension.     Palpations: Abdomen is soft. There is no mass.     Tenderness: There is no abdominal tenderness.  Genitourinary:    General: Normal vulva.     Comments: Uterus normal No adnexal mass Musculoskeletal:     Cervical back: Normal range of motion and neck supple.  Neurological:     Mental Status: She is alert.     No results found for this or any previous visit (from the past 24 hour(s)).  No results found.  Assessment/Plan: Persistent thickened endometrium most likely from endometrial polyp, new family history of endometrial cancer.  Surgical procedure, risks, alternatives, chances of removing polyp all reviewed, questions answered.  Will admit for hysteroscopy, D&C, probably resection of polyp with Jennifer Wu 07/05/2022, 7:03 PM

## 2022-07-06 ENCOUNTER — Ambulatory Visit (HOSPITAL_BASED_OUTPATIENT_CLINIC_OR_DEPARTMENT_OTHER)
Admission: RE | Admit: 2022-07-06 | Discharge: 2022-07-06 | Disposition: A | Discharge status: Home / Self Care | Payer: Medicare PPO | Attending: Obstetrics and Gynecology | Admitting: Obstetrics and Gynecology

## 2022-07-06 ENCOUNTER — Ambulatory Visit (HOSPITAL_BASED_OUTPATIENT_CLINIC_OR_DEPARTMENT_OTHER): Payer: Medicare PPO | Admitting: Certified Registered"

## 2022-07-06 ENCOUNTER — Other Ambulatory Visit: Payer: Self-pay

## 2022-07-06 ENCOUNTER — Encounter (HOSPITAL_BASED_OUTPATIENT_CLINIC_OR_DEPARTMENT_OTHER): Payer: Self-pay | Admitting: Obstetrics and Gynecology

## 2022-07-06 ENCOUNTER — Encounter (HOSPITAL_BASED_OUTPATIENT_CLINIC_OR_DEPARTMENT_OTHER)
Admission: RE | Disposition: A | Discharge status: Home / Self Care | Payer: Self-pay | Source: Home / Self Care | Attending: Obstetrics and Gynecology

## 2022-07-06 DIAGNOSIS — N840 Polyp of corpus uteri: Secondary | ICD-10-CM

## 2022-07-06 DIAGNOSIS — E119 Type 2 diabetes mellitus without complications: Secondary | ICD-10-CM | POA: Diagnosis not present

## 2022-07-06 DIAGNOSIS — Z8249 Family history of ischemic heart disease and other diseases of the circulatory system: Secondary | ICD-10-CM | POA: Diagnosis not present

## 2022-07-06 DIAGNOSIS — Z833 Family history of diabetes mellitus: Secondary | ICD-10-CM | POA: Insufficient documentation

## 2022-07-06 DIAGNOSIS — E118 Type 2 diabetes mellitus with unspecified complications: Secondary | ICD-10-CM

## 2022-07-06 DIAGNOSIS — J45909 Unspecified asthma, uncomplicated: Secondary | ICD-10-CM | POA: Insufficient documentation

## 2022-07-06 DIAGNOSIS — Z7984 Long term (current) use of oral hypoglycemic drugs: Secondary | ICD-10-CM

## 2022-07-06 DIAGNOSIS — I10 Essential (primary) hypertension: Secondary | ICD-10-CM | POA: Insufficient documentation

## 2022-07-06 HISTORY — DX: Essential (primary) hypertension: I10

## 2022-07-06 HISTORY — PX: DILATATION & CURETTAGE/HYSTEROSCOPY WITH MYOSURE: SHX6511

## 2022-07-06 LAB — BASIC METABOLIC PANEL
Anion gap: 8 (ref 5–15)
BUN: 12 mg/dL (ref 8–23)
CO2: 27 mmol/L (ref 22–32)
Calcium: 8.6 mg/dL — ABNORMAL LOW (ref 8.9–10.3)
Chloride: 104 mmol/L (ref 98–111)
Creatinine, Ser: 0.76 mg/dL (ref 0.44–1.00)
GFR, Estimated: >60 mL/min (ref >60)
Glucose, Bld: 107 mg/dL — ABNORMAL HIGH (ref 70–99)
Potassium: 3.2 mmol/L — ABNORMAL LOW (ref 3.5–5.1)
Sodium: 139 mmol/L (ref 135–145)

## 2022-07-06 LAB — CBC
HCT: 37.3 % (ref 36.0–46.0)
Hemoglobin: 12.1 g/dL (ref 12.0–15.0)
MCH: 27.8 pg (ref 26.0–34.0)
MCHC: 32.4 g/dL (ref 30.0–36.0)
MCV: 85.6 fL (ref 80.0–100.0)
Platelets: 237 10*3/uL (ref 150–400)
RBC: 4.36 MIL/uL (ref 3.87–5.11)
RDW: 13.5 % (ref 11.5–15.5)
WBC: 8 10*3/uL (ref 4.0–10.5)
nRBC: 0 % (ref 0.0–0.2)

## 2022-07-06 LAB — GLUCOSE, CAPILLARY: Glucose-Capillary: 104 mg/dL — ABNORMAL HIGH (ref 70–99)

## 2022-07-06 SURGERY — DILATATION & CURETTAGE/HYSTEROSCOPY WITH MYOSURE
Anesthesia: General

## 2022-07-06 MED ORDER — FENTANYL CITRATE (PF) 100 MCG/2ML IJ SOLN
INTRAMUSCULAR | Status: DC | PRN
  Administered 2022-07-06: 50 ug via INTRAVENOUS

## 2022-07-06 MED ORDER — LIDOCAINE HCL 2 % IJ SOLN
INTRAMUSCULAR | Status: DC | PRN
  Administered 2022-07-06: 16 mL

## 2022-07-06 MED ORDER — HYDROMORPHONE HCL 1 MG/ML IJ SOLN: 0.2500 mg | INTRAMUSCULAR | Status: DC | PRN

## 2022-07-06 MED ORDER — PROPOFOL 10 MG/ML IV BOLUS
INTRAVENOUS | Status: AC
  Filled 2022-07-06: qty 20

## 2022-07-06 MED ORDER — AMISULPRIDE (ANTIEMETIC) 5 MG/2ML IV SOLN: 10.0000 mg | Freq: Once | INTRAVENOUS | Status: DC | PRN

## 2022-07-06 MED ORDER — LACTATED RINGERS IV SOLN: INTRAVENOUS | Status: DC

## 2022-07-06 MED ORDER — LIDOCAINE HCL (PF) 2 % IJ SOLN
INTRAMUSCULAR | Status: AC
  Filled 2022-07-06: qty 5

## 2022-07-06 MED ORDER — SODIUM CHLORIDE 0.9 % IR SOLN
Status: DC | PRN
  Administered 2022-07-06: 3000 mL

## 2022-07-06 MED ORDER — OXYCODONE HCL 5 MG PO TABS
5.0000 mg | ORAL_TABLET | Freq: Once | ORAL | Status: AC | PRN
  Administered 2022-07-06: 5 mg via ORAL

## 2022-07-06 MED ORDER — ONDANSETRON HCL 4 MG/2ML IJ SOLN
INTRAMUSCULAR | Status: DC | PRN
  Administered 2022-07-06: 4 mg via INTRAVENOUS

## 2022-07-06 MED ORDER — PROMETHAZINE HCL 25 MG/ML IJ SOLN: 6.2500 mg | INTRAMUSCULAR | Status: DC | PRN

## 2022-07-06 MED ORDER — OXYCODONE HCL 5 MG PO TABS
ORAL_TABLET | ORAL | Status: AC
  Filled 2022-07-06: qty 1

## 2022-07-06 MED ORDER — OXYCODONE HCL 5 MG/5ML PO SOLN: 5.0000 mg | Freq: Once | ORAL | Status: AC | PRN

## 2022-07-06 MED ORDER — LIDOCAINE 2% (20 MG/ML) 5 ML SYRINGE
INTRAMUSCULAR | Status: DC | PRN
  Administered 2022-07-06: 60 mg via INTRAVENOUS

## 2022-07-06 MED ORDER — DEXAMETHASONE SODIUM PHOSPHATE 10 MG/ML IJ SOLN
INTRAMUSCULAR | Status: DC | PRN
  Administered 2022-07-06: 5 mg via INTRAVENOUS

## 2022-07-06 MED ORDER — PROPOFOL 10 MG/ML IV BOLUS
INTRAVENOUS | Status: DC | PRN
  Administered 2022-07-06: 200 mg via INTRAVENOUS

## 2022-07-06 MED ORDER — FENTANYL CITRATE (PF) 100 MCG/2ML IJ SOLN
INTRAMUSCULAR | Status: AC
  Filled 2022-07-06: qty 2

## 2022-07-06 SURGICAL SUPPLY — 24 items
CATH ROBINSON RED A/P 16FR (CATHETERS) ×1 IMPLANT
DEVICE MYOSURE LITE (MISCELLANEOUS) IMPLANT
DEVICE MYOSURE REACH (MISCELLANEOUS) IMPLANT
DILATOR CANAL MILEX (MISCELLANEOUS) IMPLANT
DRSG TELFA 3X8 NADH STRL (GAUZE/BANDAGES/DRESSINGS) ×1 IMPLANT
ELECT REM PT RETURN 9FT ADLT (ELECTROSURGICAL)
ELECTRODE REM PT RTRN 9FT ADLT (ELECTROSURGICAL) IMPLANT
GAUZE 4X4 16PLY ~~LOC~~+RFID DBL (SPONGE) ×2 IMPLANT
GLOVE ORTHO TXT STRL SZ7.5 (GLOVE) ×1 IMPLANT
GOWN STRL REUS W/TWL LRG LVL3 (GOWN DISPOSABLE) ×1 IMPLANT
IV NS IRRIG 3000ML ARTHROMATIC (IV SOLUTION) ×2 IMPLANT
KIT PROCEDURE FLUENT (KITS) ×1 IMPLANT
KIT TURNOVER CYSTO (KITS) ×1 IMPLANT
LOOP CUTTING BIPOLAR 21FR (ELECTRODE) IMPLANT
MYOSURE XL FIBROID (MISCELLANEOUS)
PACK VAGINAL MINOR WOMEN LF (CUSTOM PROCEDURE TRAY) ×1 IMPLANT
PAD OB MATERNITY 4.3X12.25 (PERSONAL CARE ITEMS) ×1 IMPLANT
PAD PREP 24X48 CUFFED NSTRL (MISCELLANEOUS) ×1 IMPLANT
SEAL CERVICAL OMNI LOK (ABLATOR) IMPLANT
SEAL ROD LENS SCOPE MYOSURE (ABLATOR) ×1 IMPLANT
SLEEVE SCD COMPRESS KNEE MED (STOCKING) ×1 IMPLANT
SYSTEM TISS REMOVAL MYOSURE XL (MISCELLANEOUS) IMPLANT
TOWEL OR 17X24 6PK STRL BLUE (TOWEL DISPOSABLE) ×1 IMPLANT
WATER STERILE IRR 500ML POUR (IV SOLUTION) ×1 IMPLANT

## 2022-07-06 NOTE — Discharge Instructions (Addendum)
Routine instructions for hysteroscopy   DISCHARGE INSTRUCTIONS: HYSTEROSCOPY / ENDOMETRIAL ABLATION The following instructions have been prepared to help you care for yourself upon your return home.  May Remove Scop patch on or before  May take Ibuprofen after  May take stool softner while taking narcotic pain medication to prevent constipation.  Drink plenty of water.  Personal hygiene:  Use sanitary pads for vaginal drainage, not tampons.  Shower the day after your procedure.  NO tub baths, pools or Jacuzzis for 2-3 weeks.  Wipe front to back after using the bathroom.  Activity and limitations:  Do NOT drive or operate any equipment for 24 hours. The effects of anesthesia are still present and drowsiness may result.  Do NOT rest in bed all day.  Walking is encouraged.  Walk up and down stairs slowly.  You may resume your normal activity in one to two days or as indicated by your physician. Sexual activity: NO intercourse for at least 2 weeks after the procedure, or as indicated by your Doctor.  Diet: Eat a light meal as desired this evening. You may resume your usual diet tomorrow.  Return to Work: You may resume your work activities in one to two days or as indicated by Therapist, sports.  What to expect after your surgery: Expect to have vaginal bleeding/discharge for 2-3 days and spotting for up to 10 days. It is not unusual to have soreness for up to 1-2 weeks. You may have a slight burning sensation when you urinate for the first day. Mild cramps may continue for a couple of days. You may have a regular period in 2-6 weeks.  Call your doctor for any of the following:  Excessive vaginal bleeding or clotting, saturating and changing one pad every hour.  Inability to urinate 6 hours after discharge from hospital.  Pain not relieved by pain medication.  Fever of 100.4 F or greater.  Unusual vaginal discharge or odor.  You may take tylenol up to1,000 mg every 6 hrs and  alternate with Ibuprofen 600 mg every 6 hrs as needed for pain control.    Post Anesthesia Home Care Instructions  Activity: Get plenty of rest for the remainder of the day. A responsible individual must stay with you for 24 hours following the procedure.  For the next 24 hours, DO NOT: -Drive a car -Advertising copywriter -Drink alcoholic beverages -Take any medication unless instructed by your physician -Make any legal decisions or sign important papers.  Meals: Start with liquid foods such as gelatin or soup. Progress to regular foods as tolerated. Avoid greasy, spicy, heavy foods. If nausea and/or vomiting occur, drink only clear liquids until the nausea and/or vomiting subsides. Call your physician if vomiting continues.  Special Instructions/Symptoms: Your throat may feel dry or sore from the anesthesia or the breathing tube placed in your throat during surgery. If this causes discomfort, gargle with warm salt water. The discomfort should disappear within 24 hours.

## 2022-07-06 NOTE — Op Note (Signed)
Preoperative diagnosis: Endometrial polyps Postoperative diagnosis: Same Procedure: Hysteroscopy with Myosure resection of polyps Surgeon: Lavina Hamman M.D. Anesthesia: Gen. With an LMA, paracervical block Findings: She had a normal endometrial cavity with several small to medium sized polyps, minimal endometrial tissue Estimated blood loss: Minimal Fluid deficit: Through the hysteroscope fluid deficit was about 5 cc Specimens: Endometrial resection sent for routine pathology Complications: None  Procedure in detail: The patient was taken to the operating room and placed in the dorsosupine position. General anesthesia was induced. She was placed in mobile stirrups and legs were elevated. Perineum and vagina were prepped and draped in the usual sterile fashion. A Graves speculum was inserted in the vagina and the anterior lip of the cervix was grasped with a single-tooth tenaculum. Paracervical block was performed with a total of 16 cc of 2% plain lidocaine. The uterus then sounded to 8 cm. The cervix was gradually dilated to a size 21 dilator without difficulty. The Myosure hysteroscope was inserted and good visualization was achieved.  Several polyps identified.  The Myosure Lite was inserted and all polyps were completely resected, as well as some small pieces of endometrial tissue.  The endometrial cavity was now normal, no other visible lesions.  The hysteroscope was removed. The single-tooth tenaculum was removed from the cervix and bleeding was controlled with pressure. All instruments were then removed from the vagina. The patient tolerated the procedure well and was taken to the recovery in stable condition. Counts were correct and she had PAS hose on throughout the procedure.

## 2022-07-06 NOTE — Interval H&P Note (Signed)
History and Physical Interval Note:  07/06/2022 8:41 AM  Jennifer Wu  has presented today for surgery, with the diagnosis of endometrial polyp.  The various methods of treatment have been discussed with the patient and family. After consideration of risks, benefits and other options for treatment, the patient has consented to  Procedure(s): DILATATION & CURETTAGE/HYSTEROSCOPY WITH MYOSURE (N/A) as a surgical intervention.  The patient's history has been reviewed, patient examined, no change in status, stable for surgery.  I have reviewed the patient's chart and labs.  Questions were answered to the patient's satisfaction.     Leighton Roach Dimitriy Carreras

## 2022-07-06 NOTE — Anesthesia Postprocedure Evaluation (Signed)
Anesthesia Post Note  Patient: Jennifer Wu  Procedure(s) Performed: DILATATION & CURETTAGE/HYSTEROSCOPY WITH MYOSURE     Patient location during evaluation: PACU Anesthesia Type: General Level of consciousness: awake and alert Pain management: pain level controlled Vital Signs Assessment: post-procedure vital signs reviewed and stable Respiratory status: spontaneous breathing, nonlabored ventilation and respiratory function stable Cardiovascular status: blood pressure returned to baseline and stable Postop Assessment: no apparent nausea or vomiting Anesthetic complications: no   No notable events documented.  Last Vitals:  Vitals:   07/06/22 1042 07/06/22 1045  BP: (!) 151/94 (!) 151/70  Pulse: 74 79  Resp: 17 13  Temp: 36.8 C   SpO2: 97% (!) 89%    Last Pain:  Vitals:   07/06/22 1010  TempSrc:   PainSc: 3                  Lowella Curb

## 2022-07-06 NOTE — Anesthesia Procedure Notes (Signed)
Procedure Name: LMA Insertion Date/Time: 07/06/2022 9:16 AM  Performed by: Francie Massing, CRNAPre-anesthesia Checklist: Patient identified, Emergency Drugs available, Suction available and Patient being monitored Patient Re-evaluated:Patient Re-evaluated prior to induction Oxygen Delivery Method: Circle system utilized Preoxygenation: Pre-oxygenation with 100% oxygen Induction Type: IV induction Ventilation: Mask ventilation without difficulty LMA: LMA inserted LMA Size: 4.0 Number of attempts: 1 Airway Equipment and Method: Bite block Placement Confirmation: positive ETCO2 Tube secured with: Tape Dental Injury: Teeth and Oropharynx as per pre-operative assessment

## 2022-07-06 NOTE — Anesthesia Preprocedure Evaluation (Signed)
Anesthesia Evaluation  Patient identified by MRN, date of birth, ID band Patient awake    Reviewed: Allergy & Precautions, H&P , NPO status , Patient's Chart, lab work & pertinent test results, reviewed documented beta blocker date and time   Airway Mallampati: III  TM Distance: >3 FB Neck ROM: full    Dental no notable dental hx. (+) Missing,    Pulmonary asthma    Pulmonary exam normal breath sounds clear to auscultation       Cardiovascular hypertension, Pt. on medications negative cardio ROS  Rhythm:regular Rate:Normal     Neuro/Psych negative neurological ROS  negative psych ROS   GI/Hepatic negative GI ROS, Neg liver ROS,GERD  Medicated,,  Endo/Other  negative endocrine ROSdiabetes, Type 2    Renal/GU negative Renal ROS  negative genitourinary   Musculoskeletal  (+) Arthritis , Osteoarthritis,    Abdominal   Peds negative pediatric ROS (+)  Hematology negative hematology ROS (+)   Anesthesia Other Findings   Reproductive/Obstetrics negative OB ROS                             Anesthesia Physical Anesthesia Plan  ASA: 3  Anesthesia Plan: General   Post-op Pain Management: Minimal or no pain anticipated   Induction: Intravenous  PONV Risk Score and Plan: 3 and Ondansetron, Dexamethasone, Midazolam and Treatment may vary due to age or medical condition  Airway Management Planned: LMA  Additional Equipment:   Intra-op Plan:   Post-operative Plan:   Informed Consent: I have reviewed the patients History and Physical, chart, labs and discussed the procedure including the risks, benefits and alternatives for the proposed anesthesia with the patient or authorized representative who has indicated his/her understanding and acceptance.     Dental Advisory Given  Plan Discussed with: CRNA and Surgeon  Anesthesia Plan Comments: (  Discussed  general anesthesia, including  possible nausea, instrumentation of airway, sore throat,pulmonary aspiration, etc. I asked if the were any outstanding questions, or  concerns before we proceeded. )        Anesthesia Quick Evaluation

## 2022-07-06 NOTE — Transfer of Care (Signed)
Immediate Anesthesia Transfer of Care Note  Patient: Jennifer Wu  Procedure(s) Performed: Procedure(s) (LRB): DILATATION & CURETTAGE/HYSTEROSCOPY WITH MYOSURE (N/A)  Patient Location: PACU  Anesthesia Type: General  Level of Consciousness: awake, oriented, sedated and patient cooperative  Airway & Oxygen Therapy: Patient Spontanous Breathing and Patient connected to face mask oxygen  Post-op Assessment: Report given to PACU RN and Post -op Vital signs reviewed and stable  Post vital signs: Reviewed and stable  Complications: No apparent anesthesia complications  Last Vitals:  Vitals Value Taken Time  BP 153/75 07/06/22 1000  Temp 36.9 C 07/06/22 0954  Pulse 81 07/06/22 1004  Resp 16 07/06/22 1004  SpO2 98 % 07/06/22 1004  Vitals shown include unvalidated device data.  Last Pain:  Vitals:   07/06/22 1000  TempSrc:   PainSc: 0-No pain      Patients Stated Pain Goal: 5 (06/27/22 1255)  Complications: No notable events documented.

## 2022-07-06 NOTE — Progress Notes (Signed)
Dr. Jackelyn Knife called to request pain med for patient to take at home. Instructions for alternating Tylenol and Ibuprofen given. Patient instructed on this and instructions written on discharge instructions taken home by patient.

## 2022-07-07 ENCOUNTER — Encounter (HOSPITAL_BASED_OUTPATIENT_CLINIC_OR_DEPARTMENT_OTHER): Payer: Self-pay | Admitting: Obstetrics and Gynecology

## 2022-07-07 LAB — SURGICAL PATHOLOGY

## 2022-07-14 ENCOUNTER — Other Ambulatory Visit: Payer: Self-pay

## 2022-07-14 ENCOUNTER — Other Ambulatory Visit (HOSPITAL_COMMUNITY): Payer: Self-pay

## 2022-07-17 ENCOUNTER — Other Ambulatory Visit (HOSPITAL_COMMUNITY): Payer: Self-pay

## 2022-07-24 ENCOUNTER — Other Ambulatory Visit (HOSPITAL_COMMUNITY): Payer: Self-pay

## 2022-07-24 ENCOUNTER — Encounter: Payer: Self-pay | Admitting: Obstetrics and Gynecology

## 2022-07-24 ENCOUNTER — Ambulatory Visit: Payer: Medicare PPO | Admitting: Obstetrics and Gynecology

## 2022-07-24 VITALS — BP 143/92 | HR 80 | Ht 64.17 in | Wt 172.0 lb

## 2022-07-24 DIAGNOSIS — R350 Frequency of micturition: Secondary | ICD-10-CM

## 2022-07-24 DIAGNOSIS — N393 Stress incontinence (female) (male): Secondary | ICD-10-CM

## 2022-07-24 DIAGNOSIS — N3281 Overactive bladder: Secondary | ICD-10-CM | POA: Diagnosis not present

## 2022-07-24 DIAGNOSIS — N3941 Urge incontinence: Secondary | ICD-10-CM

## 2022-07-24 LAB — POCT URINALYSIS DIPSTICK
Bilirubin, UA: NEGATIVE
Blood, UA: NEGATIVE
Glucose, UA: NEGATIVE
Ketones, UA: NEGATIVE
Leukocytes, UA: NEGATIVE
Nitrite, UA: NEGATIVE
Protein, UA: NEGATIVE
Spec Grav, UA: 1.02 (ref 1.010–1.025)
Urobilinogen, UA: 0.2 E.U./dL
pH, UA: 7 (ref 5.0–8.0)

## 2022-07-24 MED ORDER — TROSPIUM CHLORIDE ER 60 MG PO CP24
1.0000 | ORAL_CAPSULE | Freq: Every day | ORAL | 5 refills | Status: DC
Start: 2022-07-24 — End: 2023-01-12
  Filled 2022-07-24: qty 30, 30d supply, fill #0
  Filled 2022-08-26: qty 30, 30d supply, fill #1
  Filled 2022-08-28: qty 5, 5d supply, fill #1
  Filled 2022-08-29 – 2022-09-01 (×2): qty 30, 30d supply, fill #2
  Filled 2022-09-04: qty 85, 85d supply, fill #2
  Filled 2022-09-04: qty 5, 5d supply, fill #2
  Filled 2022-12-06 – 2022-12-08 (×2): qty 55, 55d supply, fill #3

## 2022-07-24 NOTE — Patient Instructions (Signed)
We discussed the symptoms of overactive bladder (OAB), which include urinary urgency, urinary frequency, night-time urination, with or without urge incontinence.  We discussed management including behavioral therapy (decreasing bladder irritants by following a bladder diet, urge suppression strategies, timed voids, bladder retraining), physical therapy, medication; and for refractory cases posterior tibial nerve stimulation, sacral neuromodulation, and intravesical botulinum toxin injection.   For anticholinergic medications, we discussed the potential side effects of anticholinergics including dry eyes, dry mouth, constipation, rare risks of cognitive impairment and urinary retention. You were given an Rx for Trospium 60mg  ER.   For treatment of stress urinary incontinence, which is leakage with physical activity/movement/strainging/coughing, we discussed expectant management versus nonsurgical options versus surgery. Nonsurgical options include weight loss, physical therapy, as well as a pessary.  Surgical options include a midurethral sling, which is a synthetic mesh sling that acts like a hammock under the urethra to prevent leakage of urine, and transurethral injection of a bulking agent.

## 2022-07-24 NOTE — Progress Notes (Signed)
[facility] New Patient Evaluation and Consultation  Referring Provider: Lavina Hamman, MD PCP: Donita Brooks, MD Date of Service: 07/24/2022  SUBJECTIVE Chief Complaint: New Patient (Initial Visit)  History of Present Illness: Jennifer Wu is a 82 y.o. White or Caucasian female seen in consultation at the request of Dr. Jackelyn Knife for evaluation of urinary urgency.    Review of records significant for Hysteroscopy with Myosure resection of polyps (07/06/22) with Dr. Jackelyn Knife  Last A1c 7.3 (02/07/22)  Has tried Gemtesa, Myrbetriq, and Bulking. She reports she had a vagal response during the bulking.   Has never done pelvic floor PT  Urinary Symptoms: Leaks urine with cough/ sneeze, laughing, exercise, lifting, going from sitting to standing, with a full bladder, with movement to the bathroom, with urgency, while asleep, and continuously Leaks >10 time(s) per days.  Pad use: 7-8 pads per day.   She is bothered by her UI symptoms.  Day time voids 6-8.  Nocturia: 3-4 times per night to void. Voiding dysfunction: she empties her bladder well.  does not use a catheter to empty bladder.  When urinating, she feels the need to urinate multiple times in a row Drinks: Occasional coffee or tea, 120z Water, 24 oz Diet Dr. Reino Kent per day  UTIs: 1 UTI's in the last year.   Denies history of blood in urine, kidney or bladder stones, pyelonephritis, bladder cancer, and kidney cancer  Pelvic Organ Prolapse Symptoms:                  She Denies a feeling of a bulge the vaginal area.   Bowel Symptom: Bowel movements: 3-4 time(s) per week Stool consistency: soft  Straining: no.  Splinting: no.  Incomplete evacuation: no.  She Denies accidental bowel leakage / fecal incontinence Bowel regimen: none Last colonoscopy: Date 2019, Results WNL  Sexual Function Sexually active: no but she would be if her husband was not having prostate issues. 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   Seasonal allergies    Vitamin D deficiency      Past Surgical History:   Past Surgical History:  Procedure Laterality Date   BACK SURGERY     BREAST SURGERY     COLONOSCOPY     DILATATION & CURETTAGE/HYSTEROSCOPY WITH MYOSURE N/A 07/06/2022   Procedure: DILATATION & CURETTAGE/HYSTEROSCOPY WITH MYOSURE;  Surgeon: Lavina Hamman, MD;  Location: [facility];  Service: Gynecology;  Laterality: N/A;   DILATION AND CURETTAGE OF UTERUS  1976   MAB   ELBOW SURGERY Left 01/26/2011   No BP lt arm due to titanum rod   ELBOW SURGERY     FRACTURE SURGERY  rt foot-1990   HYSTEROSCOPY WITH D & C  06/09/2011   Procedure: DILATATION AND CURETTAGE /HYSTEROSCOPY;  Surgeon: Lavina Hamman, MD;  Location: [facility];  Service: Gynecology;  Laterality: N/A;   KNEE SURGERY  10/12  right knee   LUMBAR LAMINECTOMY  1991   LYMPH NODE BIOPSY     right breast   RADIAL HEAD IMPLANT  02/01/2011   Procedure: RADIAL HEAD IMPLANT;  Surgeon: Marlowe Shores, MD;  Location:  SURGERY CENTER;  Service: Orthopedics;  Laterality: Left;  left radial head replacement   SVD     x 3     Past OB/GYN History: G4 P3 Vaginal deliveries: 3,  Forceps/ Vacuum deliveries: 0, Cesarean section: 0 Menopausal: Yes, at age 56 Last pap smear was 2021.  Any history of abnormal pap smears: no.   Medications: She has a current medication list which includes the  following prescription(s): albuterol, amlodipine, atorvastatin, cyclobenzaprine, famotidine, furosemide, metformin, montelukast, pantoprazole, pyridoxine, ozempic (1 mg/dose), trospium chloride, vitamin d, [DISCONTINUED] hydrochlorothiazide, and [DISCONTINUED] valsartan.   Allergies: Patient is allergic to lisinopril.   Social History:  Social History   Tobacco Use   Smoking status: Never   Smokeless tobacco: Never  Vaping Use   Vaping Use: Never used  Substance Use Topics   Alcohol use: No   Drug use: No    Relationship status: married She lives with husband, daughter, and granddaughter.   She is not employed . Regular exercise: Yes: walking History of abuse: No  Family History:   Family History  Problem Relation Age of Onset   Heart disease Mother        MI's   CAD Mother    Diabetes Mother    Alzheimer's disease Father    Alcohol abuse Father        cirrhosis   CAD Brother    Diabetes Brother    Heart disease Brother        MI's     Review of Systems: Review of Systems  Constitutional:  Positive for fever. Negative for malaise/fatigue and weight loss.  Respiratory:  Positive for cough. Negative for shortness of breath and wheezing.   Cardiovascular:  Negative for chest pain, palpitations and leg swelling.  Gastrointestinal:  Negative for abdominal pain, blood in stool and constipation.  Genitourinary:  Positive for frequency and urgency.  Neurological:  Negative for dizziness, weakness and headaches.  Endo/Heme/Allergies:  Bruises/bleeds easily.  Psychiatric/Behavioral:  The patient is not nervous/anxious.      OBJECTIVE Physical Exam: Vitals:   07/24/22 1029  BP: (!) 143/92  Pulse: 80  Weight: 172 lb (78 kg)  Height: 5' 4.17" (1.63 m)    Physical Exam Constitutional:      Appearance: Normal appearance.  Genitourinary:    General: Normal vulva.     Rectum: Normal.  Skin:    General: Skin is warm and dry.  Neurological:     Mental Status: She  is alert and oriented to person, place, and time.  Psychiatric:        Mood and Affect: Mood normal.        Behavior: Behavior normal.      GU / Detailed Urogynecologic Evaluation:  Pelvic Exam: Normal external female genitalia; Bartholin's and Skene's glands normal in appearance; urethral meatus normal in appearance, no urethral masses or discharge.   CST: positive  Speculum exam reveals normal vaginal mucosa with atrophy. Cervix normal appearance. Uterus normal single, nontender. Adnexa normal adnexa.     With apex supported, anterior compartment defect was present  Pelvic floor strength II/V  Pelvic floor musculature: Right levator non-tender, Right obturator non-tender, Left levator non-tender, Left obturator non-tender  POP-Q:   POP-Q  -2  Aa   -2                                           Ba  -8                                              C   4                                            Gh  3                                            Pb  10                                            tvl   -2.5                                            Ap  -2.5                                            Bp  -9.5                                              D      Rectal Exam:  Normal external rectal exam  Post-Void Residual (PVR) by Bladder Scan: In order to evaluate bladder emptying, we discussed obtaining a postvoid residual and she agreed to this procedure.  Procedure: The ultrasound unit was placed on the patient's abdomen in the suprapubic region after the patient had voided. A PVR of 118 ml was obtained by bladder scan.  Laboratory Results: POC urine: negative   ASSESSMENT AND PLAN Ms. Avery is a 82 y.o. with:  1. Overactive bladder   2. Urge incontinence   3. Urinary frequency   4. SUI (stress urinary incontinence, female)    OAB - We discussed the symptoms of overactive bladder (OAB), which include urinary  urgency, urinary frequency, nocturia, with or without urge incontinence.  While we do not know the exact etiology of OAB, several treatment options exist. We discussed management including behavioral therapy (decreasing bladder irritants, urge suppression strategies, timed voids, bladder retraining), physical therapy, medication; for refractory cases posterior tibial nerve stimulation, sacral neuromodulation, and intravesical botulinum toxin injection.  - She is potentially interested in PTNS but will be away for the summer at the beach. Therefore will try another medication (has already failed [place] and Myrbetriq).  - Prescribed Trospium 60mg  ER daily. For anticholinergic medications, we discussed the potential side  effects of anticholinergics including dry eyes, dry mouth, constipation, cognitive impairment and urinary retention.  2. SUI - For treatment of stress urinary incontinence,  non-surgical options include expectant management, weight loss, physical therapy, as well as a pessary.  Surgical options include a midurethral sling, Burch urethropexy, and transurethral injection of a bulking agent. -Has not had good success with urethral bulking, she demonstrate leakage with cough today. She will try a pessary and will return for a fitting. She may be interested in a sling if this does not work well for her.   Pt will also request records be sent from [facility]- had urodynamic testing.   Return for pessary fitting  Marguerita Beards, MD

## 2022-07-31 NOTE — Progress Notes (Unsigned)
[facility]   Subjective:     Chief Complaint: Pessary Check Jennifer Wu is a 82 y.o. female is here for pessary check.)  History of Present Illness: Jennifer Wu is a 82 y.o. female with stress incontinence who presents today for a pessary fitting.    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   Past Surgical History: She  has a past surgical history that includes Knee surgery (10/12); Breast surgery; Lymph node biopsy; Colonoscopy; Lumbar laminectomy (1991); Back surgery; Fracture surgery (rt foot-1990); Radial head implant (02/01/2011); Dilation and curettage of uterus (1976); SVD; Hysteroscopy with D & C (06/09/2011); Elbow surgery (Left, 01/26/2011); Elbow surgery; and Dilatation & curettage/hysteroscopy with myosure (N/A, 07/06/2022).   Medications: She has a current medication list which includes the following prescription(s): albuterol, amlodipine, atorvastatin, cyclobenzaprine, famotidine, furosemide, metformin, montelukast, pantoprazole, pyridoxine, ozempic (1 mg/dose), trospium chloride, vitamin d, [DISCONTINUED] hydrochlorothiazide, and [DISCONTINUED] valsartan.   Allergies: Patient is allergic to lisinopril.   Social History: Patient  reports that she has never smoked. She has never used smokeless tobacco. She reports that she does not drink alcohol and does not use drugs.      Objective:    BP 125/61   Pulse 98  Gen: No apparent distress, A&O x 3. Pelvic Exam: Normal external female genitalia; Bartholin's and Skene's  glands normal in appearance; urethral meatus normal in appearance, no urethral masses or discharge.   Attempted a #3 dish pessary which patient reports was very uncomfortable.   A size #3 incontinence ring with support pessary was fitted. It was comfortable, stayed in place with valsalva and was an appropriate size on examination, with one finger fitting between the pessary and the vaginal walls. Patient was able to void and simulate a bowel movement without pessary expulsion.   Assessment/Plan:    Assessment: Jennifer Wu is a 82 y.o. with stress incontinence who presents for a pessary fitting. Plan: She was fitted with a #3 incontinence ring with support pessary. She will keep the pessary in place until next visit. She will use lubricant.   Follow-up in 3 weeks for a pessary check or sooner as needed.   Reports she does not notice any leakage assistance with the Trospium. It has only been a week so will give it a bit longer and see if she has noticed improvement at 3 week pessary check.   All questions were answered.    Selmer Dominion, NP

## 2022-08-01 ENCOUNTER — Encounter: Payer: Self-pay | Admitting: Obstetrics and Gynecology

## 2022-08-01 ENCOUNTER — Ambulatory Visit: Payer: Medicare PPO | Admitting: Obstetrics and Gynecology

## 2022-08-01 VITALS — BP 125/61 | HR 98

## 2022-08-01 DIAGNOSIS — N3941 Urge incontinence: Secondary | ICD-10-CM

## 2022-08-01 DIAGNOSIS — N3281 Overactive bladder: Secondary | ICD-10-CM

## 2022-08-01 DIAGNOSIS — N393 Stress incontinence (female) (male): Secondary | ICD-10-CM | POA: Diagnosis not present

## 2022-08-01 NOTE — Patient Instructions (Signed)
Use lubrications x2 weekly for the pessary. If the pessary comes out, do not panic. If you see it, just wash it off with soap and water and put it in a bag.

## 2022-08-03 ENCOUNTER — Other Ambulatory Visit: Payer: Self-pay | Admitting: Family Medicine

## 2022-08-03 ENCOUNTER — Other Ambulatory Visit: Payer: Self-pay

## 2022-08-03 ENCOUNTER — Other Ambulatory Visit (HOSPITAL_COMMUNITY): Payer: Self-pay

## 2022-08-03 MED ORDER — AMLODIPINE BESYLATE 5 MG PO TABS
5.0000 mg | ORAL_TABLET | Freq: Every day | ORAL | 0 refills | Status: DC
  Filled 2022-08-03: qty 60, 60d supply, fill #0

## 2022-08-03 NOTE — Telephone Encounter (Signed)
Requested medication (s) are due for refill today:   Yes  Requested medication (s) are on the active medication list:   Yes  Future visit scheduled:   No   LOV 03/13/2022 with Dr. Tanya Nones   Last ordered: 02/07/2022 #90, 1  Returned because no more refills remain on this rx.    Also no protocol assigned to it.      Requested Prescriptions  Pending Prescriptions Disp Refills   amLODipine (NORVASC) 5 MG tablet 90 tablet 1    Sig: Take 1 tablet (5 mg total) by mouth daily.     There is no refill protocol information for this order

## 2022-08-07 ENCOUNTER — Other Ambulatory Visit: Payer: Self-pay | Admitting: Family Medicine

## 2022-08-07 ENCOUNTER — Other Ambulatory Visit (HOSPITAL_COMMUNITY): Payer: Self-pay

## 2022-08-07 DIAGNOSIS — E118 Type 2 diabetes mellitus with unspecified complications: Secondary | ICD-10-CM

## 2022-08-08 NOTE — Telephone Encounter (Signed)
Requested medication (s) are due for refill today: Yes  Requested medication (s) are on the active medication list: Yes  Last refill:  06/02/22  Future visit scheduled: No  Notes to clinic:  Protocol indicates pt. Needs appointment.    Requested Prescriptions  Pending Prescriptions Disp Refills   Semaglutide, 1 MG/DOSE, (OZEMPIC, 1 MG/DOSE,) 4 MG/3ML SOPN 3 mL 1    Sig: Inject 1 mg as directed once a week.     Endocrinology:  Diabetes - GLP-1 Receptor Agonists - semaglutide Failed - 08/07/2022 12:02 PM      Failed - HBA1C in normal range and within 180 days    Hgb A1c MFr Bld  Date Value Ref Range Status  02/07/2022 7.3 (H) <5.7 % of total Hgb Final    Comment:    For someone without known diabetes, a hemoglobin A1c value of 6.5% or greater indicates that they may have  diabetes and this should be confirmed with a follow-up  test. . For someone with known diabetes, a value <7% indicates  that their diabetes is well controlled and a value  greater than or equal to 7% indicates suboptimal  control. A1c targets should be individualized based on  duration of diabetes, age, comorbid conditions, and  other considerations. . Currently, no consensus exists regarding use of hemoglobin A1c for diagnosis of diabetes for children. .          Failed - Valid encounter within last 6 months    Recent Outpatient Visits           1 year ago Diabetes mellitus type 2 with complications (HCC)   [facility] Pickard, Priscille Heidelberg, MD   1 year ago Benign essential HTN   [facility] Pickard, Priscille Heidelberg, MD   2 years ago COVID-19   [facility] Tanya Nones, Priscille Heidelberg, MD   3 years ago Benign essential HTN   [facility] Tanya Nones, Priscille Heidelberg, MD   4 years ago Benign essential HTN   [facility] Pickard, Priscille Heidelberg, MD       Future Appointments             In 2 weeks Selmer Dominion, NP [facility] at  [facility]            Passed - Cr in normal range and within 360 days    Creat  Date Value Ref Range Status  11/22/2021 0.77 0.60 - 1.00 mg/dL Final   Creatinine, Ser  Date Value Ref Range Status  07/06/2022 0.76 0.44 - 1.00 mg/dL Final

## 2022-08-09 ENCOUNTER — Other Ambulatory Visit (HOSPITAL_COMMUNITY): Payer: Self-pay

## 2022-08-09 MED ORDER — OZEMPIC (1 MG/DOSE) 4 MG/3ML ~~LOC~~ SOPN
1.0000 mg | PEN_INJECTOR | SUBCUTANEOUS | 1 refills | Status: DC
  Filled 2022-08-09: qty 3, 28d supply, fill #0
  Filled 2022-09-05: qty 3, 28d supply, fill #1

## 2022-08-22 ENCOUNTER — Encounter: Payer: Self-pay | Admitting: Obstetrics and Gynecology

## 2022-08-22 ENCOUNTER — Ambulatory Visit (INDEPENDENT_AMBULATORY_CARE_PROVIDER_SITE_OTHER): Payer: Medicare PPO | Admitting: Obstetrics and Gynecology

## 2022-08-22 VITALS — BP 138/82 | HR 86

## 2022-08-22 DIAGNOSIS — N3281 Overactive bladder: Secondary | ICD-10-CM

## 2022-08-22 DIAGNOSIS — R350 Frequency of micturition: Secondary | ICD-10-CM

## 2022-08-22 DIAGNOSIS — N393 Stress incontinence (female) (male): Secondary | ICD-10-CM | POA: Diagnosis not present

## 2022-08-22 DIAGNOSIS — N3941 Urge incontinence: Secondary | ICD-10-CM

## 2022-08-22 NOTE — Progress Notes (Signed)
[facility] Return Visit  SUBJECTIVE  History of Present Illness: Jennifer Wu is a 82 y.o. female seen in follow-up for SUI and OAB. Plan at last visit was trial an incontinence pessary for SUI.   Patient reports she has not been happy with the pessary or her Trospium 60mg  ER daily and has noticed no improvement.   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   Past Surgical History: She  has a past surgical history that includes Knee surgery (10/12); Breast surgery; Lymph node biopsy; Colonoscopy; Lumbar laminectomy (1991); Back surgery; Fracture surgery (rt foot-1990); Radial head implant (02/01/2011); Dilation and curettage of uterus (1976); SVD; Hysteroscopy with D & C (06/09/2011); Elbow surgery (Left, 01/26/2011); Elbow surgery; and Dilatation & curettage/hysteroscopy with myosure (N/A, 07/06/2022).   Medications: She has a current medication list which includes the following prescription(s): albuterol, amlodipine, atorvastatin, cyclobenzaprine, famotidine, furosemide, metformin, montelukast, pantoprazole, pyridoxine, ozempic (1 mg/dose), trospium chloride, vitamin d, [DISCONTINUED] hydrochlorothiazide, and [DISCONTINUED] valsartan.   Allergies: Patient is allergic to lisinopril.   Social History: Patient  reports that she has never smoked. She has never used smokeless tobacco. She reports that she does not drink alcohol and does not use drugs.      OBJECTIVE     Physical Exam: Vitals:   08/22/22 1146  BP: 138/82  Pulse: 86   Gen:  No apparent distress, A&O x 3.  Detailed Urogynecologic Evaluation:  Pessary removed. No sign of bleeding, infection, or irritation.      ASSESSMENT AND PLAN    Jennifer Wu is a 82 y.o. with:  1. SUI (stress urinary incontinence, female)   2. Overactive bladder   3. Urge incontinence   4. Urinary frequency    Patient is not happy with the pessary. Pessary removed today and not replaced at patient's request.  Patient reports her Trospium 60mg  ER is not helping. Encouraged her to discontinue the medication if it is not helping. As she has tried and failed Gemtesa 75mg  daily and Myrbetriq 50mg  daily do not feel like trying another medication would be a good plan at this time with the risk of cognitive impairment related to anticholinergic medications.  Considering options between Mid-urethral sling and trying another urethral bulking.  Is considering trying Pumpkin seed extract up to 5gm a day. She is also considering tertiary therapies including bladder botox, SNM, or PTNS. Reiterated that this is all a quality of life choice and do not want her to feel pressured into making a decision for surgical choice. She reports her goals are to be able to walk down to the beach in a bathing suit and not pee on herself.   Patient to follow up with office once she has an idea of what she would like to do.

## 2022-08-22 NOTE — Patient Instructions (Addendum)
Options for Stress Incontinence Include: Urethral bulking or Sling  Urge incontinence : Bladder Botox or Sacral Neuromodulation   There are multiple combination options for these 4 treatment modalities. It is really up to you which direction you want to use.     You can try pumpkin seed extract up to 5gm (5000mg  daily) to help with overactive bladder.

## 2022-08-24 DIAGNOSIS — C50911 Malignant neoplasm of unspecified site of right female breast: Secondary | ICD-10-CM | POA: Diagnosis not present

## 2022-08-28 ENCOUNTER — Other Ambulatory Visit: Payer: Self-pay

## 2022-08-28 ENCOUNTER — Other Ambulatory Visit (HOSPITAL_COMMUNITY): Payer: Self-pay

## 2022-08-28 DIAGNOSIS — C50911 Malignant neoplasm of unspecified site of right female breast: Secondary | ICD-10-CM | POA: Diagnosis not present

## 2022-08-29 ENCOUNTER — Other Ambulatory Visit (HOSPITAL_COMMUNITY): Payer: Self-pay

## 2022-08-31 DIAGNOSIS — E119 Type 2 diabetes mellitus without complications: Secondary | ICD-10-CM | POA: Diagnosis not present

## 2022-08-31 DIAGNOSIS — D3131 Benign neoplasm of right choroid: Secondary | ICD-10-CM | POA: Diagnosis not present

## 2022-08-31 DIAGNOSIS — H524 Presbyopia: Secondary | ICD-10-CM | POA: Diagnosis not present

## 2022-09-01 ENCOUNTER — Other Ambulatory Visit (HOSPITAL_COMMUNITY): Payer: Self-pay

## 2022-09-04 ENCOUNTER — Other Ambulatory Visit (HOSPITAL_COMMUNITY): Payer: Self-pay

## 2022-09-06 ENCOUNTER — Other Ambulatory Visit (HOSPITAL_COMMUNITY): Payer: Self-pay

## 2022-09-21 ENCOUNTER — Other Ambulatory Visit (HOSPITAL_COMMUNITY): Payer: Self-pay

## 2022-09-21 ENCOUNTER — Telehealth: Payer: Self-pay

## 2022-09-21 ENCOUNTER — Other Ambulatory Visit: Payer: Self-pay | Admitting: Family Medicine

## 2022-09-21 ENCOUNTER — Other Ambulatory Visit: Payer: Self-pay

## 2022-09-21 DIAGNOSIS — E118 Type 2 diabetes mellitus with unspecified complications: Secondary | ICD-10-CM

## 2022-09-21 NOTE — Telephone Encounter (Signed)
Pt's daughter called in stating that pt will be out of town until Aug and will need a refill of these meds sent to pharmacy please.  Semaglutide, 1 MG/DOSE, (OZEMPIC, 1 MG/DOSE,) 4 MG/3ML SOPN amLODipine (NORVASC) 5 MG tablet [[id number]] metFORMIN (GLUCOPHAGE) 500 MG tablet [[id number]] furosemide (LASIX) 20 MG tablet [[id number]]  LOV: 03/14/22   PHARMACY: [facility]  CB#: [phone]

## 2022-09-22 ENCOUNTER — Other Ambulatory Visit: Payer: Self-pay

## 2022-09-22 ENCOUNTER — Other Ambulatory Visit (HOSPITAL_COMMUNITY): Payer: Self-pay

## 2022-09-22 DIAGNOSIS — E782 Mixed hyperlipidemia: Secondary | ICD-10-CM

## 2022-09-22 DIAGNOSIS — I10 Essential (primary) hypertension: Secondary | ICD-10-CM

## 2022-09-22 DIAGNOSIS — E118 Type 2 diabetes mellitus with unspecified complications: Secondary | ICD-10-CM

## 2022-09-22 MED ORDER — OZEMPIC (1 MG/DOSE) 4 MG/3ML ~~LOC~~ SOPN
1.0000 mg | PEN_INJECTOR | SUBCUTANEOUS | 1 refills | Status: DC
Start: 2022-09-22 — End: 2022-12-06
  Filled 2022-09-22 – 2022-10-03 (×2): qty 3, 28d supply, fill #0
  Filled 2022-11-03 (×2): qty 3, 28d supply, fill #1

## 2022-09-22 MED ORDER — AMLODIPINE BESYLATE 5 MG PO TABS
5.0000 mg | ORAL_TABLET | Freq: Every day | ORAL | 1 refills | Status: DC
Start: 2022-09-22 — End: 2022-12-06
  Filled 2022-09-22 – 2022-10-03 (×2): qty 30, 30d supply, fill #0
  Filled 2022-11-03 (×2): qty 30, 30d supply, fill #1

## 2022-09-22 MED ORDER — METFORMIN HCL 500 MG PO TABS
500.0000 mg | ORAL_TABLET | Freq: Two times a day (BID) | ORAL | 1 refills | Status: DC
Start: 2022-09-22 — End: 2022-12-06
  Filled 2022-09-22 – 2022-10-03 (×3): qty 60, 30d supply, fill #0
  Filled 2022-11-03 (×2): qty 60, 30d supply, fill #1

## 2022-09-22 MED ORDER — FUROSEMIDE 20 MG PO TABS
20.0000 mg | ORAL_TABLET | Freq: Every day | ORAL | 1 refills | Status: DC
Start: 2022-09-22 — End: 2022-12-06
  Filled 2022-09-22 – 2022-10-03 (×2): qty 30, 30d supply, fill #0
  Filled 2022-11-03 (×2): qty 30, 30d supply, fill #1

## 2022-09-26 ENCOUNTER — Other Ambulatory Visit (HOSPITAL_COMMUNITY): Payer: Self-pay

## 2022-09-27 ENCOUNTER — Other Ambulatory Visit (HOSPITAL_COMMUNITY): Payer: Self-pay

## 2022-10-03 ENCOUNTER — Other Ambulatory Visit (HOSPITAL_COMMUNITY): Payer: Self-pay

## 2022-10-03 ENCOUNTER — Other Ambulatory Visit: Payer: Self-pay

## 2022-10-04 ENCOUNTER — Other Ambulatory Visit: Payer: Self-pay

## 2022-11-03 ENCOUNTER — Other Ambulatory Visit (HOSPITAL_COMMUNITY): Payer: Self-pay

## 2022-12-05 ENCOUNTER — Other Ambulatory Visit: Payer: Self-pay | Admitting: Family Medicine

## 2022-12-05 DIAGNOSIS — J45991 Cough variant asthma: Secondary | ICD-10-CM

## 2022-12-05 DIAGNOSIS — R053 Chronic cough: Secondary | ICD-10-CM

## 2022-12-05 DIAGNOSIS — E782 Mixed hyperlipidemia: Secondary | ICD-10-CM

## 2022-12-05 DIAGNOSIS — E118 Type 2 diabetes mellitus with unspecified complications: Secondary | ICD-10-CM

## 2022-12-05 DIAGNOSIS — I10 Essential (primary) hypertension: Secondary | ICD-10-CM

## 2022-12-05 DIAGNOSIS — J45901 Unspecified asthma with (acute) exacerbation: Secondary | ICD-10-CM

## 2022-12-05 NOTE — Telephone Encounter (Signed)
Prescription Request  12/05/2022  LOV: 03/13/2022 **Patient aware she needs a med refill appt**  What is the name of the medication or equipment?   Semaglutide, 1 MG/DOSE, (OZEMPIC, 1 MG/DOSE,) 4 MG/3ML SOPN **Patient took last dose Friday, 12/01/22**  metFORMIN (GLUCOPHAGE) 500 MG tablet [[id number]]    atorvastatin (LIPITOR) 20 MG tablet [[id number]]   amLODipine (NORVASC) 5 MG tablet [[id number]]   montelukast (SINGULAIR) 10 MG tablet [[id number]]   furosemide (LASIX) 20 MG tablet [[id number]]   cyclobenzaprine (FLEXERIL) 5 MG tablet [[id number]]   pantoprazole (PROTONIX) 40 MG tablet [[id number]]   Have you contacted your pharmacy to request a refill? Yes   Which pharmacy would you like this sent to?    [facility] [address] [place] [address] Phone: [phone] Fax: [phone]   Patient notified that their request is being sent to the clinical staff for review and that they should receive a response within 2 business days.   Please advise at [phone]

## 2022-12-06 ENCOUNTER — Other Ambulatory Visit (HOSPITAL_COMMUNITY): Payer: Self-pay

## 2022-12-06 MED ORDER — AMLODIPINE BESYLATE 5 MG PO TABS
5.0000 mg | ORAL_TABLET | Freq: Every day | ORAL | 0 refills | Status: DC
Start: 2022-12-06 — End: 2023-01-12
  Filled 2022-12-06 – 2022-12-08 (×2): qty 30, 30d supply, fill #0

## 2022-12-06 MED ORDER — OZEMPIC (1 MG/DOSE) 4 MG/3ML ~~LOC~~ SOPN
1.0000 mg | PEN_INJECTOR | SUBCUTANEOUS | 0 refills | Status: DC
Start: 2022-12-06 — End: 2023-01-12
  Filled 2022-12-06 – 2022-12-08 (×2): qty 3, 28d supply, fill #0

## 2022-12-06 MED ORDER — MONTELUKAST SODIUM 10 MG PO TABS
10.0000 mg | ORAL_TABLET | Freq: Every day | ORAL | 0 refills | Status: DC
Start: 2022-12-06 — End: 2023-01-12
  Filled 2022-12-06 – 2022-12-08 (×2): qty 30, 30d supply, fill #0

## 2022-12-06 MED ORDER — ATORVASTATIN CALCIUM 20 MG PO TABS
20.0000 mg | ORAL_TABLET | Freq: Every day | ORAL | 0 refills | Status: DC
Start: 2022-12-06 — End: 2023-01-12
  Filled 2022-12-06 – 2022-12-07 (×2): qty 30, 30d supply, fill #0

## 2022-12-06 MED ORDER — FUROSEMIDE 20 MG PO TABS
20.0000 mg | ORAL_TABLET | Freq: Every day | ORAL | 0 refills | Status: DC
Start: 2022-12-06 — End: 2023-01-12
  Filled 2022-12-06 – 2022-12-08 (×2): qty 30, 30d supply, fill #0

## 2022-12-06 MED ORDER — METFORMIN HCL 500 MG PO TABS
500.0000 mg | ORAL_TABLET | Freq: Two times a day (BID) | ORAL | 0 refills | Status: DC
Start: 2022-12-06 — End: 2023-01-12
  Filled 2022-12-06 – 2022-12-08 (×2): qty 60, 30d supply, fill #0

## 2022-12-06 MED ORDER — PANTOPRAZOLE SODIUM 40 MG PO TBEC
40.0000 mg | DELAYED_RELEASE_TABLET | Freq: Two times a day (BID) | ORAL | 0 refills | Status: DC
Start: 2022-12-06 — End: 2023-01-12
  Filled 2022-12-06 – 2022-12-08 (×2): qty 60, 30d supply, fill #0

## 2022-12-06 NOTE — Telephone Encounter (Signed)
Requested medication (s) are due for refill today: due 12/29/22  Requested medication (s) are on the active medication list: yes    Last refill: 06/29/22  #90 1 refill  Future visit scheduled yes 12/21/22  Notes to clinic:Not delegated. Cannot refuse non-delegated meds per protocol.  Please review. Thank you.  Requested Prescriptions  Pending Prescriptions Disp Refills   cyclobenzaprine (FLEXERIL) 5 MG tablet 90 tablet 1    Sig: Take 1 tablet (5 mg total) by mouth 3 (three) times daily as needed for muscle spasms.     Not Delegated - Analgesics:  Muscle Relaxants Failed - 12/06/2022 11:12 AM      Failed - This refill cannot be delegated      Failed - Valid encounter within last 6 months    Recent Outpatient Visits           1 year ago Diabetes mellitus type 2 with complications (HCC)   [facility] Pickard, Priscille Heidelberg, MD   2 years ago Benign essential HTN   [facility] Pickard, Priscille Heidelberg, MD   2 years ago COVID-19   [facility] Medicine Tanya Nones, Priscille Heidelberg, MD   3 years ago Benign essential HTN   [facility] Donita Brooks, MD   4 years ago Benign essential HTN   [facility] Pickard, Priscille Heidelberg, MD       Future Appointments             In 2 weeks Tanya Nones, Priscille Heidelberg, MD [facility]            Signed Prescriptions Disp Refills   Semaglutide, 1 MG/DOSE, (OZEMPIC, 1 MG/DOSE,) 4 MG/3ML SOPN 3 mL 0    Sig: Inject 1 mg as directed once a week.     Endocrinology:  Diabetes - GLP-1 Receptor Agonists - semaglutide Failed - 12/06/2022 11:12 AM      Failed - HBA1C in normal range and within 180 days    Hgb A1c MFr Bld  Date Value Ref Range Status  02/07/2022 7.3 (H) <5.7 % of total Hgb Final    Comment:    For someone without known diabetes, a hemoglobin A1c value of 6.5% or greater indicates that they may have  diabetes and this should be confirmed with a follow-up   test. . For someone with known diabetes, a value <7% indicates  that their diabetes is well controlled and a value  greater than or equal to 7% indicates suboptimal  control. A1c targets should be individualized based on  duration of diabetes, age, comorbid conditions, and  other considerations. . Currently, no consensus exists regarding use of hemoglobin A1c for diagnosis of diabetes for children. .          Failed - Valid encounter within last 6 months    Recent Outpatient Visits           1 year ago Diabetes mellitus type 2 with complications (HCC)   [facility] Donita Brooks, MD   2 years ago Benign essential HTN   [facility] Pickard, Priscille Heidelberg, MD   2 years ago COVID-19   [facility] Medicine Tanya Nones, Priscille Heidelberg, MD   3 years ago Benign essential HTN   [facility] Family Medicine Tanya Nones Priscille Heidelberg, MD   4 years ago Benign essential HTN   [facility] Family Medicine Pickard, Priscille Heidelberg, MD  Future Appointments             In 2 weeks Pickard, Priscille Heidelberg, MD [facility]            Passed - Cr in normal range and within 360 days    Creat  Date Value Ref Range Status  11/22/2021 0.77 0.60 - 1.00 mg/dL Final   Creatinine, Ser  Date Value Ref Range Status  07/06/2022 0.76 0.44 - 1.00 mg/dL Final          atorvastatin (LIPITOR) 20 MG tablet 30 tablet 0    Sig: Take 1 tablet (20 mg total) by mouth daily.     Cardiovascular:  Antilipid - Statins Failed - 12/06/2022 11:12 AM      Failed - Valid encounter within last 12 months    Recent Outpatient Visits           1 year ago Diabetes mellitus type 2 with complications (HCC)   [facility] Pickard, Priscille Heidelberg, MD   2 years ago Benign essential HTN   [facility] Medicine Pickard, Priscille Heidelberg, MD   2 years ago COVID-19   [facility] Medicine Tanya Nones, Priscille Heidelberg, MD   3 years ago Benign essential HTN    [facility] Family Medicine Donita Brooks, MD   4 years ago Benign essential HTN   [facility] Medicine Pickard, Priscille Heidelberg, MD       Future Appointments             In 2 weeks Tanya Nones, Priscille Heidelberg, MD [facility]            Failed - Lipid Panel in normal range within the last 12 months    Cholesterol  Date Value Ref Range Status  11/22/2021 147 <200 mg/dL Final   LDL Cholesterol (Calc)  Date Value Ref Range Status  11/22/2021 77 mg/dL (calc) Final    Comment:    Reference range: <100 . Desirable range <100 mg/dL for primary prevention;   <70 mg/dL for patients with CHD or diabetic patients  with > or = 2 CHD risk factors. Marland Kitchen LDL-C is now calculated using the Martin-Hopkins  calculation, which is a validated novel method providing  better accuracy than the Friedewald equation in the  estimation of LDL-C.  Horald Pollen et al. Lenox Ahr. 0981;191(47): 2061-2068  (http://education.[employer].com/faq/FAQ164)    HDL  Date Value Ref Range Status  11/22/2021 40 (L) > OR = 50 mg/dL Final   Triglycerides  Date Value Ref Range Status  11/22/2021 207 (H) <150 mg/dL Final    Comment:    . If a non-fasting specimen was collected, consider repeat triglyceride testing on a fasting specimen if clinically indicated.  Perry Mount et al. J. of Clin. Lipidol. 2015;9:129-169. Marland Kitchen          Passed - Patient is not pregnant       metFORMIN (GLUCOPHAGE) 500 MG tablet 60 tablet 0    Sig: Take 1 tablet (500 mg total) by mouth 2 (two) times daily with a meal.     Endocrinology:  Diabetes - Biguanides Failed - 12/06/2022 11:12 AM      Failed - HBA1C is between 0 and 7.9 and within 180 days    Hgb A1c MFr Bld  Date Value Ref Range Status  02/07/2022 7.3 (H) <5.7 % of total Hgb Final    Comment:    For someone without  known diabetes, a hemoglobin A1c value of 6.5% or greater indicates that they may have  diabetes and this should be confirmed  with a follow-up  test. . For someone with known diabetes, a value <7% indicates  that their diabetes is well controlled and a value  greater than or equal to 7% indicates suboptimal  control. A1c targets should be individualized based on  duration of diabetes, age, comorbid conditions, and  other considerations. . Currently, no consensus exists regarding use of hemoglobin A1c for diagnosis of diabetes for children. .          Failed - B12 Level in normal range and within 720 days    No results found for: "VITAMINB12"       Failed - Valid encounter within last 6 months    Recent Outpatient Visits           1 year ago Diabetes mellitus type 2 with complications (HCC)   [facility] Medicine Donita Brooks, MD   2 years ago Benign essential HTN   [facility] Pickard, Priscille Heidelberg, MD   2 years ago COVID-19   [facility] Medicine Tanya Nones, Priscille Heidelberg, MD   3 years ago Benign essential HTN   [facility] Medicine Donita Brooks, MD   4 years ago Benign essential HTN   [facility] Family Medicine Pickard, Priscille Heidelberg, MD       Future Appointments             In 2 weeks Pickard, Priscille Heidelberg, MD [place] [facility]            Failed - CBC within normal limits and completed in the last 12 months    WBC  Date Value Ref Range Status  07/06/2022 8.0 4.0 - 10.5 K/uL Final   RBC  Date Value Ref Range Status  07/06/2022 4.36 3.87 - 5.11 MIL/uL Final   Hemoglobin  Date Value Ref Range Status  07/06/2022 12.1 12.0 - 15.0 g/dL Final   HGB  Date Value Ref Range Status  09/28/2009 12.5 11.6 - 15.9 g/dL Final   HCT  Date Value Ref Range Status  07/06/2022 37.3 36.0 - 46.0 % Final  09/28/2009 37.8 34.8 - 46.6 % Final   MCHC  Date Value Ref Range Status  07/06/2022 32.4 30.0 - 36.0 g/dL Final   [facility]  Date Value Ref Range Status  07/06/2022 27.8 26.0 - 34.0 pg Final   MCV  Date Value Ref Range Status   07/06/2022 85.6 80.0 - 100.0 fL Final  09/28/2009 86.7 79.5 - 101.0 fL Final   No results found for: "PLTCOUNTKUC", "LABPLAT", "POCPLA" RDW  Date Value Ref Range Status  07/06/2022 13.5 11.5 - 15.5 % Final  09/28/2009 13.4 11.2 - 14.5 % Final         Passed - Cr in normal range and within 360 days    Creat  Date Value Ref Range Status  11/22/2021 0.77 0.60 - 1.00 mg/dL Final   Creatinine, Ser  Date Value Ref Range Status  07/06/2022 0.76 0.44 - 1.00 mg/dL Final         Passed - eGFR in normal range and within 360 days    GFR, Est African American  Date Value Ref Range Status  08/10/2020 88 > OR = 60 mL/min/1.79m2 Final   GFR, Est Non African American  Date Value Ref Range Status  08/10/2020 76 > OR = 60  mL/min/1.59m2 Final   GFR, Estimated  Date Value Ref Range Status  07/06/2022 >60 >60 mL/min Final    Comment:    (NOTE) Calculated using the CKD-EPI Creatinine Equation (2021)    eGFR  Date Value Ref Range Status  11/22/2021 78 > OR = 60 mL/min/1.1m2 Final          amLODipine (NORVASC) 5 MG tablet 30 tablet 0    Sig: Take 1 tablet (5 mg total) by mouth daily.     Cardiovascular: Calcium Channel Blockers 2 Failed - 12/06/2022 11:12 AM      Failed - Valid encounter within last 6 months    Recent Outpatient Visits           1 year ago Diabetes mellitus type 2 with complications (HCC)   [facility] Pickard, Priscille Heidelberg, MD   2 years ago Benign essential HTN   [facility] Medicine Pickard, Priscille Heidelberg, MD   2 years ago COVID-19   [facility] Medicine Tanya Nones, Priscille Heidelberg, MD   3 years ago Benign essential HTN   [facility] Family Medicine Tanya Nones, Priscille Heidelberg, MD   4 years ago Benign essential HTN   [facility] Family Medicine Pickard, Priscille Heidelberg, MD       Future Appointments             In 2 weeks Tanya Nones, Priscille Heidelberg, MD [facility], [facility]            Passed - Last BP in normal range    BP Readings  from Last 1 Encounters:  08/22/22 138/82         Passed - Last Heart Rate in normal range    Pulse Readings from Last 1 Encounters:  08/22/22 86          montelukast (SINGULAIR) 10 MG tablet 30 tablet 0    Sig: Take 1 tablet (10 mg total) by mouth at bedtime.     Pulmonology:  Leukotriene Inhibitors Failed - 12/06/2022 11:12 AM      Failed - Valid encounter within last 12 months    Recent Outpatient Visits           1 year ago Diabetes mellitus type 2 with complications (HCC)   [facility] Family Medicine Pickard, Priscille Heidelberg, MD   2 years ago Benign essential HTN   [facility] Family Medicine Pickard, Priscille Heidelberg, MD   2 years ago COVID-19   [facility] Medicine Tanya Nones, Priscille Heidelberg, MD   3 years ago Benign essential HTN   [facility] Family Medicine Donita Brooks, MD   4 years ago Benign essential HTN   [facility] Family Medicine Pickard, Priscille Heidelberg, MD       Future Appointments             In 2 weeks Tanya Nones, Priscille Heidelberg, MD [facility]             furosemide (LASIX) 20 MG tablet 30 tablet 0    Sig: Take 1 tablet (20 mg total) by mouth daily.     Cardiovascular:  Diuretics - Loop Failed - 12/06/2022 11:12 AM      Failed - K in normal range and within 180 days    Potassium  Date Value Ref Range Status  07/06/2022 3.2 (L) 3.5 - 5.1 mmol/L Final         Failed - Ca in normal range  and within 180 days    Calcium  Date Value Ref Range Status  07/06/2022 8.6 (L) 8.9 - 10.3 mg/dL Final   Calcium, Ion  Date Value Ref Range Status  03/30/2017 1.19 1.15 - 1.40 mmol/L Final         Failed - Mg Level in normal range and within 180 days    Magnesium  Date Value Ref Range Status  05/09/2020 2.3 1.7 - 2.4 mg/dL Final    Comment:    Performed at [facility], [address], [place], [place] [address]         Failed - Valid encounter within last 6 months    Recent Outpatient Visits           1 year ago Diabetes  mellitus type 2 with complications (HCC)   [facility] Family Medicine Pickard, Priscille Heidelberg, MD   2 years ago Benign essential HTN   [facility] Medicine Pickard, Priscille Heidelberg, MD   2 years ago COVID-19   [facility] Medicine Tanya Nones, Priscille Heidelberg, MD   3 years ago Benign essential HTN   [facility] Family Medicine Tanya Nones, Priscille Heidelberg, MD   4 years ago Benign essential HTN   [facility] Family Medicine Pickard, Priscille Heidelberg, MD       Future Appointments             In 2 weeks Tanya Nones, Priscille Heidelberg, MD [facility]            Passed - Na in normal range and within 180 days    Sodium  Date Value Ref Range Status  07/06/2022 139 135 - 145 mmol/L Final         Passed - Cr in normal range and within 180 days    Creat  Date Value Ref Range Status  11/22/2021 0.77 0.60 - 1.00 mg/dL Final   Creatinine, Ser  Date Value Ref Range Status  07/06/2022 0.76 0.44 - 1.00 mg/dL Final         Passed - Cl in normal range and within 180 days    Chloride  Date Value Ref Range Status  07/06/2022 104 98 - 111 mmol/L Final         Passed - Last BP in normal range    BP Readings from Last 1 Encounters:  08/22/22 138/82          pantoprazole (PROTONIX) 40 MG tablet 60 tablet 0    Sig: Take 1 tablet (40 mg total) by mouth 2 (two) times daily.     Gastroenterology: Proton Pump Inhibitors Failed - 12/06/2022 11:12 AM      Failed - Valid encounter within last 12 months    Recent Outpatient Visits           1 year ago Diabetes mellitus type 2 with complications (HCC)   [facility] Family Medicine Pickard, Priscille Heidelberg, MD   2 years ago Benign essential HTN   [facility] Family Medicine Pickard, Priscille Heidelberg, MD   2 years ago COVID-19   [facility] Medicine Tanya Nones, Priscille Heidelberg, MD   3 years ago Benign essential HTN   [facility] Family Medicine Donita Brooks, MD   4 years ago Benign essential HTN   [facility] Family Medicine Pickard, Priscille Heidelberg,  MD       Future Appointments             In 2 weeks  Donita Brooks, MD [facility], [facility]

## 2022-12-06 NOTE — Telephone Encounter (Signed)
Requested Prescriptions  Pending Prescriptions Disp Refills   Semaglutide, 1 MG/DOSE, (OZEMPIC, 1 MG/DOSE,) 4 MG/3ML SOPN 3 mL 1    Sig: Inject 1 mg as directed once a week.     Endocrinology:  Diabetes - GLP-1 Receptor Agonists - semaglutide Failed - 12/06/2022 11:12 AM      Failed - HBA1C in normal range and within 180 days    Hgb A1c MFr Bld  Date Value Ref Range Status  02/07/2022 7.3 (H) <5.7 % of total Hgb Final    Comment:    For someone without known diabetes, a hemoglobin A1c value of 6.5% or greater indicates that they may have  diabetes and this should be confirmed with a follow-up  test. . For someone with known diabetes, a value <7% indicates  that their diabetes is well controlled and a value  greater than or equal to 7% indicates suboptimal  control. A1c targets should be individualized based on  duration of diabetes, age, comorbid conditions, and  other considerations. . Currently, no consensus exists regarding use of hemoglobin A1c for diagnosis of diabetes for children. .          Failed - Valid encounter within last 6 months    Recent Outpatient Visits           1 year ago Diabetes mellitus type 2 with complications (HCC)   [facility] Pickard, Priscille Heidelberg, MD   2 years ago Benign essential HTN   [facility] Pickard, Priscille Heidelberg, MD   2 years ago COVID-19   [facility] Tanya Nones, Priscille Heidelberg, MD   3 years ago Benign essential HTN   [facility] Donita Brooks, MD   4 years ago Benign essential HTN   [facility] Donita Brooks, MD       Future Appointments             In 2 weeks Tanya Nones, Priscille Heidelberg, MD [facility]            Passed - Cr in normal range and within 360 days    Creat  Date Value Ref Range Status  11/22/2021 0.77 0.60 - 1.00 mg/dL Final   Creatinine, Ser  Date Value Ref Range Status  07/06/2022 0.76 0.44 - 1.00  mg/dL Final          atorvastatin (LIPITOR) 20 MG tablet 90 tablet 3    Sig: Take 1 tablet (20 mg total) by mouth daily.     Cardiovascular:  Antilipid - Statins Failed - 12/06/2022 11:12 AM      Failed - Valid encounter within last 12 months    Recent Outpatient Visits           1 year ago Diabetes mellitus type 2 with complications (HCC)   [facility] Pickard, Priscille Heidelberg, MD   2 years ago Benign essential HTN   [facility] Pickard, Priscille Heidelberg, MD   2 years ago COVID-19   [facility] Tanya Nones, Priscille Heidelberg, MD   3 years ago Benign essential HTN   [facility] Donita Brooks, MD   4 years ago Benign essential HTN   [facility] Pickard, Priscille Heidelberg, MD       Future Appointments             In 2 weeks Pickard, Priscille Heidelberg, MD [facility]            Failed - Lipid Panel in normal range within the last 12 months    Cholesterol  Date Value Ref Range Status  11/22/2021 147 <200 mg/dL Final   LDL Cholesterol (Calc)  Date Value Ref Range Status  11/22/2021 77 mg/dL (calc) Final    Comment:    Reference range: <100 . Desirable range <100 mg/dL for primary prevention;   <70 mg/dL for patients with CHD or diabetic patients  with > or = 2 CHD risk factors. Marland Kitchen LDL-C is now calculated using the Martin-Hopkins  calculation, which is a validated novel method providing  better accuracy than the Friedewald equation in the  estimation of LDL-C.  Horald Pollen et al. Lenox Ahr. 5784;696(29): 2061-2068  (http://education.[employer].com/faq/FAQ164)    HDL  Date Value Ref Range Status  11/22/2021 40 (L) > OR = 50 mg/dL Final   Triglycerides  Date Value Ref Range Status  11/22/2021 207 (H) <150 mg/dL Final    Comment:    . If a non-fasting specimen was collected, consider repeat triglyceride testing on a fasting specimen if clinically indicated.  Perry Mount et al. J. of  Clin. Lipidol. 2015;9:129-169. Marland Kitchen          Passed - Patient is not pregnant       metFORMIN (GLUCOPHAGE) 500 MG tablet 60 tablet 1    Sig: Take 1 tablet (500 mg total) by mouth 2 (two) times daily with a meal.     Endocrinology:  Diabetes - Biguanides Failed - 12/06/2022 11:12 AM      Failed - HBA1C is between 0 and 7.9 and within 180 days    Hgb A1c MFr Bld  Date Value Ref Range Status  02/07/2022 7.3 (H) <5.7 % of total Hgb Final    Comment:    For someone without known diabetes, a hemoglobin A1c value of 6.5% or greater indicates that they may have  diabetes and this should be confirmed with a follow-up  test. . For someone with known diabetes, a value <7% indicates  that their diabetes is well controlled and a value  greater than or equal to 7% indicates suboptimal  control. A1c targets should be individualized based on  duration of diabetes, age, comorbid conditions, and  other considerations. . Currently, no consensus exists regarding use of hemoglobin A1c for diagnosis of diabetes for children. .          Failed - B12 Level in normal range and within 720 days    No results found for: "VITAMINB12"       Failed - Valid encounter within last 6 months    Recent Outpatient Visits           1 year ago Diabetes mellitus type 2 with complications (HCC)   [facility] Donita Brooks, MD   2 years ago Benign essential HTN   [facility] Pickard, Priscille Heidelberg, MD   2 years ago COVID-19   [facility] Medicine Tanya Nones, Priscille Heidelberg, MD   3 years ago Benign essential HTN   [facility] Donita Brooks, MD   4 years ago Benign essential HTN   [facility] Pickard, Priscille Heidelberg, MD       Future Appointments             In 2 weeks Pickard, Priscille Heidelberg, MD [facility]  Failed - CBC within normal limits and completed in the last 12 months    WBC  Date Value  Ref Range Status  07/06/2022 8.0 4.0 - 10.5 K/uL Final   RBC  Date Value Ref Range Status  07/06/2022 4.36 3.87 - 5.11 MIL/uL Final   Hemoglobin  Date Value Ref Range Status  07/06/2022 12.1 12.0 - 15.0 g/dL Final   HGB  Date Value Ref Range Status  09/28/2009 12.5 11.6 - 15.9 g/dL Final   HCT  Date Value Ref Range Status  07/06/2022 37.3 36.0 - 46.0 % Final  09/28/2009 37.8 34.8 - 46.6 % Final   MCHC  Date Value Ref Range Status  07/06/2022 32.4 30.0 - 36.0 g/dL Final   [facility]  Date Value Ref Range Status  07/06/2022 27.8 26.0 - 34.0 pg Final   MCV  Date Value Ref Range Status  07/06/2022 85.6 80.0 - 100.0 fL Final  09/28/2009 86.7 79.5 - 101.0 fL Final   No results found for: "PLTCOUNTKUC", "LABPLAT", "POCPLA" RDW  Date Value Ref Range Status  07/06/2022 13.5 11.5 - 15.5 % Final  09/28/2009 13.4 11.2 - 14.5 % Final         Passed - Cr in normal range and within 360 days    Creat  Date Value Ref Range Status  11/22/2021 0.77 0.60 - 1.00 mg/dL Final   Creatinine, Ser  Date Value Ref Range Status  07/06/2022 0.76 0.44 - 1.00 mg/dL Final         Passed - eGFR in normal range and within 360 days    GFR, Est African American  Date Value Ref Range Status  08/10/2020 88 > OR = 60 mL/min/1.6m2 Final   GFR, Est Non African American  Date Value Ref Range Status  08/10/2020 76 > OR = 60 mL/min/1.67m2 Final   GFR, Estimated  Date Value Ref Range Status  07/06/2022 >60 >60 mL/min Final    Comment:    (NOTE) Calculated using the CKD-EPI Creatinine Equation (2021)    eGFR  Date Value Ref Range Status  11/22/2021 78 > OR = 60 mL/min/1.82m2 Final          amLODipine (NORVASC) 5 MG tablet 30 tablet 1    Sig: Take 1 tablet (5 mg total) by mouth daily.     Cardiovascular: Calcium Channel Blockers 2 Failed - 12/06/2022 11:12 AM      Failed - Valid encounter within last 6 months    Recent Outpatient Visits           1 year ago Diabetes mellitus type 2 with  complications (HCC)   [facility] Pickard, Priscille Heidelberg, MD   2 years ago Benign essential HTN   [facility] Pickard, Priscille Heidelberg, MD   2 years ago COVID-19   [facility] Medicine Tanya Nones, Priscille Heidelberg, MD   3 years ago Benign essential HTN   [facility] Donita Brooks, MD   4 years ago Benign essential HTN   [facility] Pickard, Priscille Heidelberg, MD       Future Appointments             In 2 weeks Tanya Nones, Priscille Heidelberg, MD [facility]            Passed - Last BP in normal range    BP Readings from Last 1 Encounters:  08/22/22 138/82  Passed - Last Heart Rate in normal range    Pulse Readings from Last 1 Encounters:  08/22/22 86          montelukast (SINGULAIR) 10 MG tablet 30 tablet 11    Sig: Take 1 tablet (10 mg total) by mouth at bedtime.     Pulmonology:  Leukotriene Inhibitors Failed - 12/06/2022 11:12 AM      Failed - Valid encounter within last 12 months    Recent Outpatient Visits           1 year ago Diabetes mellitus type 2 with complications (HCC)   [facility] Pickard, Priscille Heidelberg, MD   2 years ago Benign essential HTN   [facility] Pickard, Priscille Heidelberg, MD   2 years ago COVID-19   [facility] Tanya Nones, Priscille Heidelberg, MD   3 years ago Benign essential HTN   [facility] Donita Brooks, MD   4 years ago Benign essential HTN   [facility] Donita Brooks, MD       Future Appointments             In 2 weeks Tanya Nones, Priscille Heidelberg, MD [facility]             cyclobenzaprine (FLEXERIL) 5 MG tablet 90 tablet 1    Sig: Take 1 tablet (5 mg total) by mouth 3 (three) times daily as needed for muscle spasms.     Not Delegated - Analgesics:  Muscle Relaxants Failed - 12/06/2022 11:12 AM      Failed - This refill cannot be delegated      Failed -  Valid encounter within last 6 months    Recent Outpatient Visits           1 year ago Diabetes mellitus type 2 with complications (HCC)   [facility] Pickard, Priscille Heidelberg, MD   2 years ago Benign essential HTN   [facility] Pickard, Priscille Heidelberg, MD   2 years ago COVID-19   [facility] Tanya Nones, Priscille Heidelberg, MD   3 years ago Benign essential HTN   [facility] Donita Brooks, MD   4 years ago Benign essential HTN   [facility] Pickard, Priscille Heidelberg, MD       Future Appointments             In 2 weeks Tanya Nones, Priscille Heidelberg, MD [facility]             furosemide (LASIX) 20 MG tablet 30 tablet 1    Sig: Take 1 tablet (20 mg total) by mouth daily.     Cardiovascular:  Diuretics - Loop Failed - 12/06/2022 11:12 AM      Failed - K in normal range and within 180 days    Potassium  Date Value Ref Range Status  07/06/2022 3.2 (L) 3.5 - 5.1 mmol/L Final         Failed - Ca in normal range and within 180 days    Calcium  Date Value Ref Range Status  07/06/2022 8.6 (L) 8.9 - 10.3 mg/dL Final   Calcium, Ion  Date Value Ref Range Status  03/30/2017 1.19 1.15 - 1.40 mmol/L Final         Failed - Mg Level in normal range and within 180 days    Magnesium  Date Value Ref Range Status  05/09/2020 2.3 1.7 - 2.4 mg/dL Final    Comment:    Performed at [facility], [address] [place], [place] [address]         Failed - Valid encounter within last 6 months    Recent Outpatient Visits           1 year ago Diabetes mellitus type 2 with complications (HCC)   [facility] Pickard, Priscille Heidelberg, MD   2 years ago Benign essential HTN   [facility] Pickard, Priscille Heidelberg, MD   2 years ago COVID-19   [facility] Tanya Nones, Priscille Heidelberg, MD   3 years ago Benign essential HTN   [facility] Tanya Nones, Priscille Heidelberg, MD   4  years ago Benign essential HTN   [facility] Pickard, Priscille Heidelberg, MD       Future Appointments             In 2 weeks Tanya Nones, Priscille Heidelberg, MD [facility]            Passed - Na in normal range and within 180 days    Sodium  Date Value Ref Range Status  07/06/2022 139 135 - 145 mmol/L Final         Passed - Cr in normal range and within 180 days    Creat  Date Value Ref Range Status  11/22/2021 0.77 0.60 - 1.00 mg/dL Final   Creatinine, Ser  Date Value Ref Range Status  07/06/2022 0.76 0.44 - 1.00 mg/dL Final         Passed - Cl in normal range and within 180 days    Chloride  Date Value Ref Range Status  07/06/2022 104 98 - 111 mmol/L Final         Passed - Last BP in normal range    BP Readings from Last 1 Encounters:  08/22/22 138/82          pantoprazole (PROTONIX) 40 MG tablet 180 tablet 3    Sig: Take 1 tablet (40 mg total) by mouth 2 (two) times daily.     Gastroenterology: Proton Pump Inhibitors Failed - 12/06/2022 11:12 AM      Failed - Valid encounter within last 12 months    Recent Outpatient Visits           1 year ago Diabetes mellitus type 2 with complications (HCC)   [facility] Pickard, Priscille Heidelberg, MD   2 years ago Benign essential HTN   [facility] Pickard, Priscille Heidelberg, MD   2 years ago COVID-19   [facility] Tanya Nones, Priscille Heidelberg, MD   3 years ago Benign essential HTN   [facility] Donita Brooks, MD   4 years ago Benign essential HTN   [facility] Pickard, Priscille Heidelberg, MD       Future Appointments             In 2 weeks Pickard, Priscille Heidelberg, MD [facility]

## 2022-12-07 ENCOUNTER — Other Ambulatory Visit (HOSPITAL_COMMUNITY): Payer: Self-pay

## 2022-12-07 ENCOUNTER — Other Ambulatory Visit: Payer: Self-pay

## 2022-12-08 ENCOUNTER — Other Ambulatory Visit (HOSPITAL_COMMUNITY): Payer: Self-pay

## 2022-12-08 ENCOUNTER — Other Ambulatory Visit: Payer: Self-pay

## 2022-12-11 ENCOUNTER — Other Ambulatory Visit: Payer: Self-pay | Admitting: Family Medicine

## 2022-12-11 ENCOUNTER — Other Ambulatory Visit (HOSPITAL_COMMUNITY): Payer: Self-pay

## 2022-12-11 ENCOUNTER — Other Ambulatory Visit: Payer: Self-pay

## 2022-12-11 MED ORDER — CYCLOBENZAPRINE HCL 5 MG PO TABS
5.0000 mg | ORAL_TABLET | Freq: Three times a day (TID) | ORAL | 1 refills | Status: DC | PRN
  Filled 2022-12-11: qty 90, 30d supply, fill #0
  Filled 2023-09-03: qty 90, 30d supply, fill #1

## 2022-12-12 ENCOUNTER — Other Ambulatory Visit (HOSPITAL_COMMUNITY): Payer: Self-pay

## 2022-12-21 ENCOUNTER — Ambulatory Visit: Payer: Medicare PPO | Admitting: Family Medicine

## 2023-01-04 ENCOUNTER — Ambulatory Visit: Payer: Medicare PPO | Admitting: Family Medicine

## 2023-01-11 ENCOUNTER — Ambulatory Visit: Payer: Medicare PPO | Admitting: Family Medicine

## 2023-01-12 ENCOUNTER — Other Ambulatory Visit: Payer: Self-pay | Admitting: Obstetrics and Gynecology

## 2023-01-12 ENCOUNTER — Other Ambulatory Visit: Payer: Self-pay

## 2023-01-12 ENCOUNTER — Other Ambulatory Visit (HOSPITAL_COMMUNITY): Payer: Self-pay

## 2023-01-12 ENCOUNTER — Telehealth: Payer: Medicare PPO | Admitting: Family Medicine

## 2023-01-12 DIAGNOSIS — E118 Type 2 diabetes mellitus with unspecified complications: Secondary | ICD-10-CM

## 2023-01-12 DIAGNOSIS — J45991 Cough variant asthma: Secondary | ICD-10-CM

## 2023-01-12 DIAGNOSIS — E782 Mixed hyperlipidemia: Secondary | ICD-10-CM

## 2023-01-12 DIAGNOSIS — J45901 Unspecified asthma with (acute) exacerbation: Secondary | ICD-10-CM

## 2023-01-12 DIAGNOSIS — R053 Chronic cough: Secondary | ICD-10-CM | POA: Diagnosis not present

## 2023-01-12 DIAGNOSIS — N3281 Overactive bladder: Secondary | ICD-10-CM

## 2023-01-12 DIAGNOSIS — I10 Essential (primary) hypertension: Secondary | ICD-10-CM

## 2023-01-12 DIAGNOSIS — Z7984 Long term (current) use of oral hypoglycemic drugs: Secondary | ICD-10-CM

## 2023-01-12 DIAGNOSIS — Z7985 Long-term (current) use of injectable non-insulin antidiabetic drugs: Secondary | ICD-10-CM | POA: Diagnosis not present

## 2023-01-12 MED ORDER — PANTOPRAZOLE SODIUM 40 MG PO TBEC
40.0000 mg | DELAYED_RELEASE_TABLET | Freq: Two times a day (BID) | ORAL | 3 refills | Status: DC
  Filled 2023-01-12 (×2): qty 180, 90d supply, fill #0
  Filled 2023-04-19: qty 180, 90d supply, fill #1
  Filled 2023-04-24 (×2): qty 180, 90d supply, fill #0
  Filled 2023-09-03: qty 180, 90d supply, fill #1
  Filled 2023-12-12: qty 180, 90d supply, fill #2

## 2023-01-12 MED ORDER — MONTELUKAST SODIUM 10 MG PO TABS
10.0000 mg | ORAL_TABLET | Freq: Every day | ORAL | 3 refills | Status: DC
  Filled 2023-01-12 (×2): qty 90, 90d supply, fill #0
  Filled 2023-04-19: qty 90, 90d supply, fill #1
  Filled 2023-04-24: qty 90, 90d supply, fill #0
  Filled 2023-08-08: qty 90, 90d supply, fill #1
  Filled 2023-11-12: qty 90, 90d supply, fill #0

## 2023-01-12 MED ORDER — ATORVASTATIN CALCIUM 20 MG PO TABS
20.0000 mg | ORAL_TABLET | Freq: Every day | ORAL | 3 refills | Status: DC
  Filled 2023-01-12 – 2023-02-08 (×3): qty 90, 90d supply, fill #0
  Filled 2023-04-19 – 2023-05-08 (×2): qty 90, 90d supply, fill #1
  Filled 2023-08-13 – 2023-08-14 (×2): qty 90, 90d supply, fill #2
  Filled 2023-11-19: qty 90, 90d supply, fill #3

## 2023-01-12 MED ORDER — FAMOTIDINE 40 MG PO TABS
40.0000 mg | ORAL_TABLET | Freq: Every day | ORAL | 3 refills | Status: DC
  Filled 2023-01-12 (×2): qty 90, 90d supply, fill #0
  Filled 2023-04-19: qty 90, 90d supply, fill #1
  Filled 2023-04-24: qty 90, 90d supply, fill #0
  Filled 2023-09-03: qty 90, 90d supply, fill #1
  Filled 2023-12-12: qty 90, 90d supply, fill #2

## 2023-01-12 MED ORDER — FUROSEMIDE 20 MG PO TABS
20.0000 mg | ORAL_TABLET | Freq: Every day | ORAL | 3 refills | Status: DC
  Filled 2023-01-12 (×2): qty 90, 90d supply, fill #0

## 2023-01-12 MED ORDER — OZEMPIC (1 MG/DOSE) 4 MG/3ML ~~LOC~~ SOPN
1.0000 mg | PEN_INJECTOR | SUBCUTANEOUS | 3 refills | Status: DC
  Filled 2023-01-12 (×2): qty 3, 28d supply, fill #0
  Filled 2023-02-08: qty 3, 28d supply, fill #1
  Filled 2023-03-05: qty 3, 28d supply, fill #2
  Filled 2023-03-23 – 2023-03-26 (×2): qty 3, 28d supply, fill #3

## 2023-01-12 MED ORDER — AMLODIPINE BESYLATE 5 MG PO TABS
5.0000 mg | ORAL_TABLET | Freq: Every day | ORAL | 3 refills | Status: DC
  Filled 2023-01-12 (×2): qty 90, 90d supply, fill #0

## 2023-01-12 MED ORDER — METFORMIN HCL 500 MG PO TABS
500.0000 mg | ORAL_TABLET | Freq: Two times a day (BID) | ORAL | 0 refills | Status: DC
  Filled 2023-01-12 (×2): qty 60, 30d supply, fill #0

## 2023-01-12 MED ORDER — TROSPIUM CHLORIDE ER 60 MG PO CP24
1.0000 | ORAL_CAPSULE | Freq: Every day | ORAL | 5 refills | Status: DC
  Filled 2023-01-12: qty 30, 30d supply, fill #0
  Filled 2023-02-01 – 2023-02-02 (×2): qty 90, 90d supply, fill #0
  Filled 2023-05-08: qty 90, 90d supply, fill #1

## 2023-01-12 NOTE — Progress Notes (Signed)
Subjective:    Patient ID: Jennifer Wu, female    DOB: 11/15/1940, 82 y.o.   MRN: [id number]  HPI  Patient is being seen today as a video visit.  [place].  I am currently in my office.  She consents to be seen via video.  Video conference began at 1101.  Video conference concluded at 1117.  Patient scheduled the appointment because we would not refill her medication without an office visit.  She has not had any blood work checked since December of last year.  She has a history of hypertension as well as diabetes mellitus.  She is currently on Ozempic 1 mg subcu weekly along with metformin.  She states that she is lost 51 pounds since starting the medication.  She believes that she was 217 pounds when she started it on her scales.  Today she is 168 pounds.  She states that her fasting blood sugars 100-115 in the morning.  She never sees blood sugars over 200.  She denies any hypoglycemic episodes.  She is also been dealing with urinary urgency and frequency and urge incontinence.  Her urogynecologist started her on trospium after they tried and failed pessaries.  This has decreased her nocturia from 4-5 episodes every evening to 2 episodes.  She is very happy with this.  She checked her blood pressure this morning and was 117/80.  This is consistent with her typical blood pressure. 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   Seasonal allergies    Vitamin D deficiency    Past Surgical History:  Procedure Laterality Date   BACK SURGERY     BREAST SURGERY      COLONOSCOPY     DILATATION & CURETTAGE/HYSTEROSCOPY WITH MYOSURE N/A 07/06/2022   Procedure: DILATATION & CURETTAGE/HYSTEROSCOPY WITH MYOSURE;  Surgeon: Lavina Hamman, MD;  Location: [facility];  Service: Gynecology;  Laterality: N/A;   DILATION AND CURETTAGE OF UTERUS  1976   MAB   ELBOW SURGERY Left 01/26/2011   No BP lt arm due to titanum rod   ELBOW SURGERY     FRACTURE SURGERY  rt foot-1990   HYSTEROSCOPY WITH D & C  06/09/2011   Procedure: DILATATION AND CURETTAGE /HYSTEROSCOPY;  Surgeon: Lavina Hamman, MD;  Location: [facility];  Service: Gynecology;  Laterality: N/A;   KNEE SURGERY  10/12   right knee   LUMBAR LAMINECTOMY  1991   LYMPH NODE BIOPSY     right breast   RADIAL HEAD IMPLANT  02/01/2011   Procedure: RADIAL HEAD IMPLANT;  Surgeon: Marlowe Shores, MD;  Location: [facility];  Service: Orthopedics;  Laterality: Left;  left radial head replacement   SVD     x 3   Current Outpatient Medications on File Prior to Visit  Medication Sig Dispense Refill   albuterol (VENTOLIN HFA) 108 (90 Base) MCG/ACT inhaler Inhale 1-2 puffs into the lungs every 6 (six) hours as needed for wheezing or shortness of breath. 18 g 0  cyclobenzaprine (FLEXERIL) 5 MG tablet Take 1 tablet (5 mg total) by mouth 3 (three) times daily as needed for muscle spasms. 90 tablet 1   pyridOXINE (VITAMIN B6) 100 MG tablet Take 1 tablet (100 mg total) by mouth daily. 30 tablet 3   Trospium Chloride 60 MG CP24 Take 1 capsule (60 mg total) by mouth daily. 30 capsule 5   VITAMIN D PO Take 1 capsule by mouth daily.     [DISCONTINUED] hydrochlorothiazide (HYDRODIURIL) 25 MG tablet TAKE 1 TABLET BY MOUTH DAILY. 90 tablet 1   [DISCONTINUED] valsartan (DIOVAN) 160 MG tablet Take 1 tablet (160 mg total) by mouth daily. Quit amlodipine 90 tablet 3   No current facility-administered medications on file prior to visit.   Allergies  Allergen Reactions   Lisinopril Nausea And Vomiting    Social History   Socioeconomic History   Marital status: Married    Spouse name: Baldo Ash   Number of children: 3   Years of education: Not on file   Highest education level: Not on file  Occupational History   Not on file  Tobacco Use   Smoking status: Never   Smokeless tobacco: Never  Vaping Use   Vaping status: Never Used  Substance and Sexual Activity   Alcohol use: No   Drug use: No   Sexual activity: Not Currently    Birth control/protection: Post-menopausal  Other Topics Concern   Not on file  Social History Narrative   3 daughters   1 currently under treatment for uterine cancer-2022.   13 grandchildren   11 great grandchildren   Social Determinants of Health   Financial Resource Strain: Low Risk  (02/24/2022)   Overall Financial Resource Strain (CARDIA)    Difficulty of Paying Living Expenses: Not hard at all  Food Insecurity: No Food Insecurity (02/24/2022)   Hunger Vital Sign    Worried About Running Out of Food in the Last Year: Never true    Ran Out of Food in the Last Year: Never true  Transportation Needs: No Transportation Needs (02/24/2022)   PRAPARE - Administrator, Civil Service (Medical): No    Lack of Transportation (Non-Medical): No  Physical Activity: Insufficiently Active (02/24/2022)   Exercise Vital Sign    Days of Exercise per Week: 3 days    Minutes of Exercise per Session: 20 min  Stress: No Stress Concern Present (02/24/2022)   [employer] of Occupational Health - Occupational Stress Questionnaire    Feeling of Stress : Not at all  Social Connections: Socially Integrated (02/24/2022)   Social Connection and Isolation Panel [NHANES]    Frequency of Communication with Friends and Family: More than three times a week    Frequency of Social Gatherings with Friends and Family: More than three times a week    Attends Religious Services: More than 4 times per year    Active Member of [employer] or Organizations: Yes    Attends Probation officer: More than 4 times per year    Marital Status: Married  Catering manager Violence: Not At Risk (02/24/2022)   Humiliation, Afraid, Rape, and Kick questionnaire    Fear of Current or Ex-Partner: No    Emotionally Abused: No    Physically Abused: No    Sexually Abused: No     Review of Systems     Objective:   Physical Exam    Physical exam could not be performed due to this being a video visit  Assessment & Plan:  Diabetes mellitus type 2 with complications (HCC) - Plan: Semaglutide, 1 MG/DOSE, (OZEMPIC, 1 MG/DOSE,) 4 MG/3ML SOPN, metFORMIN (GLUCOPHAGE) 500 MG tablet, Hemoglobin A1c, CBC with Differential/Platelet, Lipid panel, Hemoglobin A1c, Microalbumin/Creatinine Ratio, Urine  Cough variant asthma vs UACS - Plan: montelukast (SINGULAIR) 10 MG tablet  Asthma with acute exacerbation, unspecified asthma severity, unspecified whether persistent - Plan: montelukast (SINGULAIR) 10 MG tablet  Mixed dyslipidemia - Plan: atorvastatin (LIPITOR) 20 MG tablet, metFORMIN (GLUCOPHAGE) 500 MG tablet  Essential hypertension - Plan: furosemide (LASIX) 20 MG tablet, amLODipine (NORVASC) 5 MG tablet  Chronic cough - Plan: pantoprazole (PROTONIX) 40 MG tablet I refilled all of the patient's medications including Singulair, Protonix which she takes for chronic cough, as well as her blood pressure medication and her diabetes medication.  However I explained to the patient that I really need lab work to monitor her renal function and to determine if her diabetes is well-managed.  Therefore I would like her to come at her earliest convenience to get a CBC, CMP, lipid panel, and A1c, and a urine protein to creatinine ratio.  If her A1c is excellent we could maybe stop metformin given that she has lost so much weight.  Her blood pressure is outstanding.  I am very proud of the patient for the weight loss.

## 2023-02-02 ENCOUNTER — Other Ambulatory Visit (HOSPITAL_COMMUNITY): Payer: Self-pay

## 2023-02-08 ENCOUNTER — Other Ambulatory Visit (HOSPITAL_COMMUNITY): Payer: Self-pay

## 2023-02-08 ENCOUNTER — Other Ambulatory Visit: Payer: Self-pay

## 2023-02-08 ENCOUNTER — Other Ambulatory Visit: Payer: Self-pay | Admitting: Family Medicine

## 2023-02-08 DIAGNOSIS — E782 Mixed hyperlipidemia: Secondary | ICD-10-CM

## 2023-02-08 DIAGNOSIS — E118 Type 2 diabetes mellitus with unspecified complications: Secondary | ICD-10-CM

## 2023-02-08 NOTE — Telephone Encounter (Signed)
Requested medication (s) are due for refill today: Due 02/12/23  Requested medication (s) are on the active medication list: yes  Last refill: 01/12/23  #60  0 refills  Future visit scheduled no  Notes to clinic:Failed due to labs. Please review. Thank you.  Requested Prescriptions  Pending Prescriptions Disp Refills   metFORMIN (GLUCOPHAGE) 500 MG tablet 60 tablet 0    Sig: Take 1 tablet (500 mg total) by mouth 2 (two) times daily with a meal.     Endocrinology:  Diabetes - Biguanides Failed - 02/08/2023 11:40 AM      Failed - HBA1C is between 0 and 7.9 and within 180 days    Hgb A1c MFr Bld  Date Value Ref Range Status  02/07/2022 7.3 (H) <5.7 % of total Hgb Final    Comment:    For someone without known diabetes, a hemoglobin A1c value of 6.5% or greater indicates that they may have  diabetes and this should be confirmed with a follow-up  test. . For someone with known diabetes, a value <7% indicates  that their diabetes is well controlled and a value  greater than or equal to 7% indicates suboptimal  control. A1c targets should be individualized based on  duration of diabetes, age, comorbid conditions, and  other considerations. . Currently, no consensus exists regarding use of hemoglobin A1c for diagnosis of diabetes for children. .          Failed - B12 Level in normal range and within 720 days    No results found for: "VITAMINB12"       Failed - Valid encounter within last 6 months    Recent Outpatient Visits           1 year ago Diabetes mellitus type 2 with complications (HCC)   [facility] Donita Brooks, MD   2 years ago Benign essential HTN   [facility] Pickard, Priscille Heidelberg, MD   2 years ago COVID-19   [facility] Tanya Nones, Priscille Heidelberg, MD   3 years ago Benign essential HTN   [facility] Donita Brooks, MD   4 years ago Benign essential HTN   [facility] Donita Brooks, MD              Failed - CBC within normal limits and completed in the last 12 months    WBC  Date Value Ref Range Status  07/06/2022 8.0 4.0 - 10.5 K/uL Final   RBC  Date Value Ref Range Status  07/06/2022 4.36 3.87 - 5.11 MIL/uL Final   Hemoglobin  Date Value Ref Range Status  07/06/2022 12.1 12.0 - 15.0 g/dL Final   HGB  Date Value Ref Range Status  09/28/2009 12.5 11.6 - 15.9 g/dL Final   HCT  Date Value Ref Range Status  07/06/2022 37.3 36.0 - 46.0 % Final  09/28/2009 37.8 34.8 - 46.6 % Final   MCHC  Date Value Ref Range Status  07/06/2022 32.4 30.0 - 36.0 g/dL Final   [facility]  Date Value Ref Range Status  07/06/2022 27.8 26.0 - 34.0 pg Final   MCV  Date Value Ref Range Status  07/06/2022 85.6 80.0 - 100.0 fL Final  09/28/2009 86.7 79.5 - 101.0 fL Final   No results found for: "PLTCOUNTKUC", "LABPLAT", "POCPLA" RDW  Date Value Ref Range Status  07/06/2022 13.5 11.5 - 15.5 % Final  09/28/2009 13.4 11.2 - 14.5 % Final  Passed - Cr in normal range and within 360 days    Creat  Date Value Ref Range Status  11/22/2021 0.77 0.60 - 1.00 mg/dL Final   Creatinine, Ser  Date Value Ref Range Status  07/06/2022 0.76 0.44 - 1.00 mg/dL Final         Passed - eGFR in normal range and within 360 days    GFR, Est African American  Date Value Ref Range Status  08/10/2020 88 > OR = 60 mL/min/1.2m2 Final   GFR, Est Non African American  Date Value Ref Range Status  08/10/2020 76 > OR = 60 mL/min/1.70m2 Final   GFR, Estimated  Date Value Ref Range Status  07/06/2022 >60 >60 mL/min Final    Comment:    (NOTE) Calculated using the CKD-EPI Creatinine Equation (2021)    eGFR  Date Value Ref Range Status  11/22/2021 78 > OR = 60 mL/min/1.61m2 Final

## 2023-02-12 ENCOUNTER — Other Ambulatory Visit (HOSPITAL_COMMUNITY): Payer: Self-pay

## 2023-02-20 ENCOUNTER — Other Ambulatory Visit: Payer: Medicare PPO

## 2023-02-20 DIAGNOSIS — E118 Type 2 diabetes mellitus with unspecified complications: Secondary | ICD-10-CM

## 2023-02-21 LAB — COMPLETE METABOLIC PANEL WITH GFR
AG Ratio: 1.4 (calc) (ref 1.0–2.5)
ALT: 12 U/L (ref 6–29)
AST: 18 U/L (ref 10–35)
Albumin: 4 g/dL (ref 3.6–5.1)
Alkaline phosphatase (APISO): 77 U/L (ref 37–153)
BUN: 15 mg/dL (ref 7–25)
CO2: 20 mmol/L (ref 20–32)
Calcium: 9.8 mg/dL (ref 8.6–10.4)
Chloride: 103 mmol/L (ref 98–110)
Creat: 0.82 mg/dL (ref 0.60–0.95)
Globulin: 2.9 g/dL (ref 1.9–3.7)
Glucose, Bld: 87 mg/dL (ref 65–99)
Potassium: 3.9 mmol/L (ref 3.5–5.3)
Sodium: 141 mmol/L (ref 135–146)
Total Bilirubin: 0.4 mg/dL (ref 0.2–1.2)
Total Protein: 6.9 g/dL (ref 6.1–8.1)
eGFR: 72 mL/min/{1.73_m2} (ref >=60)

## 2023-02-21 LAB — CBC WITH DIFFERENTIAL/PLATELET
Absolute Lymphocytes: 2479 {cells}/uL (ref 850–3900)
Absolute Monocytes: 562 {cells}/uL (ref 200–950)
Basophils Absolute: 46 {cells}/uL (ref 0–200)
Basophils Relative: 0.6 %
Eosinophils Absolute: 347 {cells}/uL (ref 15–500)
Eosinophils Relative: 4.5 %
HCT: 40.2 % (ref 35.0–45.0)
Hemoglobin: 12.8 g/dL (ref 11.7–15.5)
MCH: 27.6 pg (ref 27.0–33.0)
MCHC: 31.8 g/dL — ABNORMAL LOW (ref 32.0–36.0)
MCV: 86.6 fL (ref 80.0–100.0)
MPV: 11.7 fL (ref 7.5–12.5)
Monocytes Relative: 7.3 %
Neutro Abs: 4266 {cells}/uL (ref 1500–7800)
Neutrophils Relative %: 55.4 %
Platelets: 268 10*3/uL (ref 140–400)
RBC: 4.64 10*6/uL (ref 3.80–5.10)
RDW: 12.4 % (ref 11.0–15.0)
Total Lymphocyte: 32.2 %
WBC: 7.7 10*3/uL (ref 3.8–10.8)

## 2023-02-21 LAB — LIPID PANEL
Cholesterol: 144 mg/dL (ref <200)
HDL: 46 mg/dL — ABNORMAL LOW (ref >=50)
LDL Cholesterol (Calc): 74 mg/dL
Non-HDL Cholesterol (Calc): 98 mg/dL (ref <130)
Total CHOL/HDL Ratio: 3.1 (calc) (ref <5.0)
Triglycerides: 164 mg/dL — ABNORMAL HIGH (ref <150)

## 2023-02-21 LAB — MICROALBUMIN / CREATININE URINE RATIO
Creatinine, Urine: 72 mg/dL (ref 20–275)
Microalb Creat Ratio: 50 mg/g{creat} — ABNORMAL HIGH (ref <30)
Microalb, Ur: 3.6 mg/dL

## 2023-02-21 LAB — HEMOGLOBIN A1C
Hgb A1c MFr Bld: 5.8 %{Hb} — ABNORMAL HIGH (ref <5.7)
Mean Plasma Glucose: 120 mg/dL
eAG (mmol/L): 6.6 mmol/L

## 2023-02-26 ENCOUNTER — Other Ambulatory Visit (HOSPITAL_COMMUNITY): Payer: Self-pay

## 2023-02-27 ENCOUNTER — Other Ambulatory Visit (HOSPITAL_COMMUNITY): Payer: Self-pay

## 2023-02-27 ENCOUNTER — Encounter: Payer: Self-pay | Admitting: Family Medicine

## 2023-02-27 ENCOUNTER — Ambulatory Visit: Payer: Medicare PPO | Admitting: Family Medicine

## 2023-02-27 VITALS — BP 124/78 | HR 87 | Temp 98.8°F | Ht 64.17 in | Wt 171.5 lb

## 2023-02-27 DIAGNOSIS — J069 Acute upper respiratory infection, unspecified: Secondary | ICD-10-CM | POA: Diagnosis not present

## 2023-02-27 DIAGNOSIS — C50911 Malignant neoplasm of unspecified site of right female breast: Secondary | ICD-10-CM | POA: Diagnosis not present

## 2023-02-27 MED ORDER — HYDROCODONE BIT-HOMATROP MBR 5-1.5 MG/5ML PO SOLN
5.0000 mL | Freq: Three times a day (TID) | ORAL | 0 refills | Status: DC | PRN
Start: 2023-02-27 — End: 2023-04-05
  Filled 2023-02-27: qty 120, 8d supply, fill #0

## 2023-02-27 NOTE — Assessment & Plan Note (Signed)

## 2023-02-27 NOTE — Progress Notes (Signed)
Subjective:  HPI: Jennifer Wu is a 82 y.o. female presenting on 02/27/2023 for Cough (Productive cough x 1 week. )   HPI Patient is in today for productive cough for 1 week, is coughing up clear white mucus and clear rhinorrhea with postnasal drip. Was exposed to a sick granddaughter. Cough is worse at night. Denies fever, chills, body aches, SOB, wheezing, sinus pressure. Has tried ibuprofen and takes singulair daily. Cough unrelieved with Tessalon and Nyquil. No asthma or COPD.  Review of Systems  All other systems reviewed and are negative.   Relevant past medical history reviewed and updated as indicated.   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   Seasonal allergies    Vitamin D deficiency      Past Surgical History:  Procedure Laterality Date   BACK SURGERY     BREAST SURGERY     COLONOSCOPY     DILATATION & CURETTAGE/HYSTEROSCOPY WITH MYOSURE N/A 07/06/2022   Procedure: DILATATION & CURETTAGE/HYSTEROSCOPY WITH MYOSURE;  Surgeon: Lavina Hamman, MD;  Location: [facility];  Service: Gynecology;  Laterality: N/A;   DILATION AND CURETTAGE OF UTERUS  1976   MAB   ELBOW SURGERY Left 01/26/2011   No BP lt arm due to titanum rod   ELBOW SURGERY     FRACTURE SURGERY  rt foot-1990   HYSTEROSCOPY WITH D & C  06/09/2011   Procedure: DILATATION AND CURETTAGE /HYSTEROSCOPY;  Surgeon: Lavina Hamman, MD;  Location: [facility];  Service: Gynecology;  Laterality: N/A;   KNEE SURGERY  10/12   right knee   LUMBAR LAMINECTOMY  1991   LYMPH NODE BIOPSY     right  breast   RADIAL HEAD IMPLANT  02/01/2011   Procedure: RADIAL HEAD IMPLANT;  Surgeon: Marlowe Shores, MD;  Location: [facility];  Service: Orthopedics;  Laterality: Left;  left radial head replacement   SVD     x 3    Allergies and medications reviewed and updated.   Current Outpatient Medications:    albuterol (VENTOLIN HFA) 108 (90 Base) MCG/ACT inhaler, Inhale 1-2 puffs into the lungs every 6 (six) hours as needed for wheezing or shortness of breath., Disp: 18 g, Rfl: 0   amLODipine (NORVASC) 5 MG tablet, Take 1 tablet (5 mg total) by mouth daily., Disp: 90 tablet, Rfl: 3   atorvastatin (LIPITOR) 20 MG tablet, Take 1 tablet (20 mg total) by mouth daily., Disp: 90 tablet, Rfl: 3   cyclobenzaprine (FLEXERIL) 5 MG tablet, Take 1 tablet (5 mg total) by mouth 3 (three) times daily as needed for muscle spasms., Disp: 90 tablet, Rfl: 1   famotidine (PEPCID) 40 MG tablet, Take 1 tablet (40 mg total) by mouth daily., Disp: 90 tablet, Rfl: 3   furosemide (LASIX) 20 MG tablet, Take 1 tablet (20 mg total) by mouth daily., Disp: 90 tablet, Rfl: 3   HYDROcodone bit-homatropine (HYCODAN) 5-1.5 MG/5ML syrup, Take 5 mLs by mouth every 8 (eight) hours as needed for cough., Disp: 120 mL, Rfl:  0   metFORMIN (GLUCOPHAGE) 500 MG tablet, Take 1 tablet (500 mg total) by mouth 2 (two) times daily with a meal., Disp: 60 tablet, Rfl: 0   montelukast (SINGULAIR) 10 MG tablet, Take 1 tablet (10 mg total) by mouth at bedtime., Disp: 90 tablet, Rfl: 3   pantoprazole (PROTONIX) 40 MG tablet, Take 1 tablet (40 mg total) by mouth 2 (two) times daily., Disp: 180 tablet, Rfl: 3   pyridOXINE (VITAMIN B6) 100 MG tablet, Take 1 tablet (100 mg total) by mouth daily., Disp: 30 tablet, Rfl: 3   Semaglutide, 1 MG/DOSE, (OZEMPIC, 1 MG/DOSE,) 4 MG/3ML SOPN, Inject 1 mg as directed once a week., Disp: 3 mL, Rfl: 3   Trospium Chloride 60 MG CP24, Take 1 capsule (60 mg total) by mouth daily., Disp: 30 capsule, Rfl: 5    VITAMIN D PO, Take 1 capsule by mouth daily., Disp: , Rfl:   Allergies  Allergen Reactions   Lisinopril Nausea And Vomiting    Objective:   BP 124/78 (BP Location: Left Arm)   Pulse 87   Temp 98.8 F (37.1 C)   Ht 5' 4.17" (1.63 m)   Wt 171 lb 8 oz (77.8 kg)   SpO2 99%   BMI 29.28 kg/m      02/27/2023    2:53 PM 08/22/2022   11:46 AM 08/01/2022   11:01 AM  Vitals with BMI  Height 5' 4.17"    Weight 171 lbs 8 oz    BMI 29.28    Systolic 124 138 161  Diastolic 78 82 61  Pulse 87 86 98     Physical Exam Vitals and nursing note reviewed.  Constitutional:      Appearance: Normal appearance. She is normal weight.  HENT:     Head: Normocephalic and atraumatic.     Right Ear: Tympanic membrane, ear canal and external ear normal.     Left Ear: Tympanic membrane, ear canal and external ear normal.     Nose: Rhinorrhea present. No congestion.     Right Sinus: No maxillary sinus tenderness or frontal sinus tenderness.     Left Sinus: No maxillary sinus tenderness or frontal sinus tenderness.     Mouth/Throat:     Mouth: Mucous membranes are moist.     Pharynx: Oropharynx is clear.  Eyes:     Extraocular Movements: Extraocular movements intact.     Conjunctiva/sclera: Conjunctivae normal.     Pupils: Pupils are equal, round, and reactive to light.  Cardiovascular:     Rate and Rhythm: Normal rate and regular rhythm.     Pulses: Normal pulses.     Heart sounds: Normal heart sounds.  Pulmonary:     Effort: Pulmonary effort is normal.     Breath sounds: Normal breath sounds.  Musculoskeletal:     Cervical back: No tenderness.  Lymphadenopathy:     Cervical: No cervical adenopathy.  Skin:    General: Skin is warm and dry.  Neurological:     General: No focal deficit present.     Mental Status: She is alert and oriented to person, place, and time. Mental status is at baseline.  Psychiatric:        Mood and Affect: Mood normal.        Behavior: Behavior normal.         Thought Content: Thought content normal.        Judgment: Judgment normal.     Assessment & Plan:  Viral URI with cough  Assessment & Plan: Reassured patient that symptoms and exam findings are most consistent with a viral upper respiratory infection and explained lack of efficacy of antibiotics against viruses.  Discussed expected course and features suggestive of secondary bacterial infection.  Continue supportive care. Increase fluid intake with water or electrolyte solution like pedialyte. Encouraged acetaminophen as needed for fever/pain. Encouraged salt water gargling, chloraseptic spray and throat lozenges. Encouraged OTC guaifenesin. Encouraged saline sinus flushes and/or neti with humidified air.     Other orders -     HYDROcodone Bit-Homatrop MBr; Take 5 mLs by mouth every 8 (eight) hours as needed for cough.  Dispense: 120 mL; Refill: 0     Follow up plan: Return if symptoms worsen or fail to improve.  Park Meo, FNP

## 2023-03-05 ENCOUNTER — Other Ambulatory Visit: Payer: Self-pay

## 2023-03-05 ENCOUNTER — Other Ambulatory Visit: Payer: Self-pay | Admitting: Family Medicine

## 2023-03-05 ENCOUNTER — Other Ambulatory Visit (HOSPITAL_COMMUNITY): Payer: Self-pay

## 2023-03-05 DIAGNOSIS — E782 Mixed hyperlipidemia: Secondary | ICD-10-CM

## 2023-03-05 DIAGNOSIS — E118 Type 2 diabetes mellitus with unspecified complications: Secondary | ICD-10-CM

## 2023-03-05 MED ORDER — METFORMIN HCL 500 MG PO TABS
500.0000 mg | ORAL_TABLET | Freq: Two times a day (BID) | ORAL | 0 refills | Status: DC
  Filled 2023-03-05: qty 60, 30d supply, fill #0

## 2023-03-07 ENCOUNTER — Other Ambulatory Visit (HOSPITAL_COMMUNITY): Payer: Self-pay

## 2023-03-13 ENCOUNTER — Encounter (HOSPITAL_COMMUNITY): Payer: Self-pay

## 2023-03-13 ENCOUNTER — Observation Stay (HOSPITAL_COMMUNITY): Payer: Medicare PPO

## 2023-03-13 ENCOUNTER — Other Ambulatory Visit: Payer: Self-pay

## 2023-03-13 ENCOUNTER — Observation Stay (HOSPITAL_COMMUNITY)
Admission: EM | Admit: 2023-03-13 | Discharge: 2023-03-14 | Disposition: A | Discharge status: Home / Self Care | Payer: Medicare PPO | Attending: Internal Medicine | Admitting: Internal Medicine

## 2023-03-13 DIAGNOSIS — Z23 Encounter for immunization: Secondary | ICD-10-CM | POA: Insufficient documentation

## 2023-03-13 DIAGNOSIS — R531 Weakness: Secondary | ICD-10-CM | POA: Diagnosis not present

## 2023-03-13 DIAGNOSIS — Z79899 Other long term (current) drug therapy: Secondary | ICD-10-CM | POA: Insufficient documentation

## 2023-03-13 DIAGNOSIS — E785 Hyperlipidemia, unspecified: Secondary | ICD-10-CM | POA: Diagnosis present

## 2023-03-13 DIAGNOSIS — Z7984 Long term (current) use of oral hypoglycemic drugs: Secondary | ICD-10-CM | POA: Insufficient documentation

## 2023-03-13 DIAGNOSIS — R55 Syncope and collapse: Secondary | ICD-10-CM | POA: Diagnosis not present

## 2023-03-13 DIAGNOSIS — Z6827 Body mass index (BMI) 27.0-27.9, adult: Secondary | ICD-10-CM | POA: Diagnosis not present

## 2023-03-13 DIAGNOSIS — E663 Overweight: Secondary | ICD-10-CM | POA: Diagnosis not present

## 2023-03-13 DIAGNOSIS — N179 Acute kidney failure, unspecified: Secondary | ICD-10-CM | POA: Diagnosis not present

## 2023-03-13 DIAGNOSIS — R0989 Other specified symptoms and signs involving the circulatory and respiratory systems: Secondary | ICD-10-CM | POA: Diagnosis not present

## 2023-03-13 DIAGNOSIS — R918 Other nonspecific abnormal finding of lung field: Secondary | ICD-10-CM | POA: Diagnosis not present

## 2023-03-13 DIAGNOSIS — I700 Atherosclerosis of aorta: Secondary | ICD-10-CM | POA: Diagnosis not present

## 2023-03-13 DIAGNOSIS — I959 Hypotension, unspecified: Secondary | ICD-10-CM | POA: Diagnosis not present

## 2023-03-13 DIAGNOSIS — E119 Type 2 diabetes mellitus without complications: Secondary | ICD-10-CM | POA: Diagnosis not present

## 2023-03-13 DIAGNOSIS — E876 Hypokalemia: Secondary | ICD-10-CM | POA: Diagnosis not present

## 2023-03-13 DIAGNOSIS — Z7985 Long-term (current) use of injectable non-insulin antidiabetic drugs: Secondary | ICD-10-CM | POA: Diagnosis not present

## 2023-03-13 DIAGNOSIS — E118 Type 2 diabetes mellitus with unspecified complications: Secondary | ICD-10-CM | POA: Diagnosis present

## 2023-03-13 DIAGNOSIS — I10 Essential (primary) hypertension: Secondary | ICD-10-CM | POA: Insufficient documentation

## 2023-03-13 DIAGNOSIS — R42 Dizziness and giddiness: Principal | ICD-10-CM

## 2023-03-13 DIAGNOSIS — R4182 Altered mental status, unspecified: Secondary | ICD-10-CM | POA: Diagnosis not present

## 2023-03-13 DIAGNOSIS — Z853 Personal history of malignant neoplasm of breast: Secondary | ICD-10-CM | POA: Insufficient documentation

## 2023-03-13 LAB — BASIC METABOLIC PANEL
Anion gap: 11 (ref 5–15)
BUN: 15 mg/dL (ref 8–23)
CO2: 27 mmol/L (ref 22–32)
Calcium: 10.2 mg/dL (ref 8.9–10.3)
Chloride: 98 mmol/L (ref 98–111)
Creatinine, Ser: 1.1 mg/dL — ABNORMAL HIGH (ref 0.44–1.00)
GFR, Estimated: 50 mL/min — ABNORMAL LOW (ref >60)
Glucose, Bld: 100 mg/dL — ABNORMAL HIGH (ref 70–99)
Potassium: 2.9 mmol/L — ABNORMAL LOW (ref 3.5–5.1)
Sodium: 136 mmol/L (ref 135–145)

## 2023-03-13 LAB — CBC
HCT: 41.8 % (ref 36.0–46.0)
Hemoglobin: 13.6 g/dL (ref 12.0–15.0)
MCH: 27.6 pg (ref 26.0–34.0)
MCHC: 32.5 g/dL (ref 30.0–36.0)
MCV: 85 fL (ref 80.0–100.0)
Platelets: 284 10*3/uL (ref 150–400)
RBC: 4.92 MIL/uL (ref 3.87–5.11)
RDW: 13.2 % (ref 11.5–15.5)
WBC: 10.6 10*3/uL — ABNORMAL HIGH (ref 4.0–10.5)
nRBC: 0 % (ref 0.0–0.2)

## 2023-03-13 LAB — GLUCOSE, CAPILLARY: Glucose-Capillary: 120 mg/dL — ABNORMAL HIGH (ref 70–99)

## 2023-03-13 LAB — MAGNESIUM: Magnesium: 1.4 mg/dL — ABNORMAL LOW (ref 1.7–2.4)

## 2023-03-13 LAB — CBG MONITORING, ED: Glucose-Capillary: 106 mg/dL — ABNORMAL HIGH (ref 70–99)

## 2023-03-13 LAB — PHOSPHORUS: Phosphorus: 3.7 mg/dL (ref 2.5–4.6)

## 2023-03-13 MED ORDER — ACETAMINOPHEN 325 MG PO TABS: 650.0000 mg | ORAL_TABLET | Freq: Four times a day (QID) | ORAL | Status: DC | PRN

## 2023-03-13 MED ORDER — ALBUTEROL SULFATE (2.5 MG/3ML) 0.083% IN NEBU: 2.5000 mg | INHALATION_SOLUTION | RESPIRATORY_TRACT | Status: DC | PRN

## 2023-03-13 MED ORDER — POTASSIUM CHLORIDE IN NACL 20-0.9 MEQ/L-% IV SOLN
Freq: Once | INTRAVENOUS | Status: DC
  Filled 2023-03-13: qty 1000

## 2023-03-13 MED ORDER — PANTOPRAZOLE SODIUM 40 MG PO TBEC
40.0000 mg | DELAYED_RELEASE_TABLET | Freq: Two times a day (BID) | ORAL | Status: DC
  Administered 2023-03-13 – 2023-03-14 (×2): 40 mg via ORAL
  Filled 2023-03-13 (×2): qty 1

## 2023-03-13 MED ORDER — SODIUM CHLORIDE 0.9 % IV SOLN: INTRAVENOUS | Status: DC

## 2023-03-13 MED ORDER — POTASSIUM CHLORIDE CRYS ER 20 MEQ PO TBCR
40.0000 meq | EXTENDED_RELEASE_TABLET | Freq: Four times a day (QID) | ORAL | Status: AC
  Administered 2023-03-13 – 2023-03-14 (×2): 40 meq via ORAL
  Filled 2023-03-13 (×2): qty 2

## 2023-03-13 MED ORDER — MONTELUKAST SODIUM 10 MG PO TABS
10.0000 mg | ORAL_TABLET | Freq: Every day | ORAL | Status: DC
  Administered 2023-03-13: 10 mg via ORAL
  Filled 2023-03-13: qty 1

## 2023-03-13 MED ORDER — FAMOTIDINE 20 MG PO TABS
40.0000 mg | ORAL_TABLET | Freq: Every day | ORAL | Status: DC
  Administered 2023-03-14: 40 mg via ORAL
  Filled 2023-03-13: qty 2

## 2023-03-13 MED ORDER — ACETAMINOPHEN 650 MG RE SUPP: 650.0000 mg | Freq: Four times a day (QID) | RECTAL | Status: DC | PRN

## 2023-03-13 MED ORDER — INSULIN ASPART 100 UNIT/ML IJ SOLN: 0.0000 [IU] | Freq: Three times a day (TID) | INTRAMUSCULAR | Status: DC

## 2023-03-13 MED ORDER — SODIUM CHLORIDE 0.9% FLUSH
3.0000 mL | Freq: Two times a day (BID) | INTRAVENOUS | Status: DC
Start: 2023-03-13 — End: 2023-03-14
  Administered 2023-03-13 – 2023-03-14 (×2): 3 mL via INTRAVENOUS

## 2023-03-13 MED ORDER — INSULIN ASPART 100 UNIT/ML IJ SOLN: 0.0000 [IU] | Freq: Every day | INTRAMUSCULAR | Status: DC

## 2023-03-13 MED ORDER — HYDROCODONE BIT-HOMATROP MBR 5-1.5 MG/5ML PO SOLN: 5.0000 mL | Freq: Three times a day (TID) | ORAL | Status: DC | PRN

## 2023-03-13 MED ORDER — INFLUENZA VAC A&B SURF ANT ADJ 0.5 ML IM SUSY
0.5000 mL | PREFILLED_SYRINGE | INTRAMUSCULAR | Status: AC
  Administered 2023-03-14: 0.5 mL via INTRAMUSCULAR
  Filled 2023-03-13: qty 0.5

## 2023-03-13 MED ORDER — UMECLIDINIUM BROMIDE 62.5 MCG/ACT IN AEPB
1.0000 | INHALATION_SPRAY | Freq: Every day | RESPIRATORY_TRACT | Status: DC
  Filled 2023-03-13: qty 7

## 2023-03-13 MED ORDER — VITAMIN B-6 100 MG PO TABS
100.0000 mg | ORAL_TABLET | Freq: Every day | ORAL | Status: DC
  Administered 2023-03-14: 100 mg via ORAL
  Filled 2023-03-13: qty 1

## 2023-03-13 MED ORDER — ENOXAPARIN SODIUM 40 MG/0.4ML IJ SOSY: 40.0000 mg | PREFILLED_SYRINGE | INTRAMUSCULAR | Status: DC

## 2023-03-13 MED ORDER — TROSPIUM CHLORIDE ER 60 MG PO CP24: 1.0000 | ORAL_CAPSULE | Freq: Every day | ORAL | Status: DC

## 2023-03-13 MED ORDER — ATORVASTATIN CALCIUM 10 MG PO TABS
20.0000 mg | ORAL_TABLET | Freq: Every day | ORAL | Status: DC
  Administered 2023-03-14: 20 mg via ORAL
  Filled 2023-03-13: qty 2

## 2023-03-13 NOTE — H&P (Addendum)
TRH H&P   Patient Demographics:    Jennifer Wu, is a 82 y.o. female  MRN: [id number]   DOB - 1940/12/01  Admit Date - 03/13/2023  Outpatient Primary MD for the patient is Donita Brooks, MD  Referring MD/NP/PA: Dr Jeraldine Loots   Chief Complaint  Patient presents with   Near Syncope      HPI:    Jennifer Wu  is a 82 y.o. female, past medical history of hypertension, hyperlipidemia, type 2 diabetes mellitus, presents to ED secondary to syncope, patient presents, family at bedside at this with a history, patient was seen recently for URI symptoms, 02/27/2023, she is with known underlying chronic cough, but she has been having some congestion, it was in the [place], doing some laundry, where she had an episode of syncope, patient reports she does not think she lost consciousness totally, but second-order at the scene thinks she did lose consciousness for some minutes, with some fluttering eyes, not have facial asymmetry, patient does report some lightheadedness 20 minutes preceding the presyncope, denies any focal deficits, tingling, numbness, she reports she took all her meds this morning but she did not eat or drink much. -In ED her workup was significant for elevated creatinine at 1.1, low sodium at 2.9, white blood cell count of 10.5, her EKG with no acute findings, chest x-ray, CT head still pending, she was not orthostatic, [facility] hospitalist consulted to admit.    Review of systems:      A full 10 point Review of Systems was done, except as stated above, all other Review of Systems were negative.   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 lt radial nerve-had perminent nurve damage-chronic numbness-able to use now   Seasonal allergies    Vitamin D deficiency       Past Surgical History:  Procedure Laterality Date   BACK SURGERY     BREAST SURGERY     COLONOSCOPY     DILATATION & CURETTAGE/HYSTEROSCOPY WITH MYOSURE N/A 07/06/2022   Procedure: DILATATION & CURETTAGE/HYSTEROSCOPY WITH MYOSURE;  Surgeon: Lavina Hamman, MD;  Location: [facility];  Service: Gynecology;  Laterality: N/A;   DILATION AND CURETTAGE OF UTERUS  1976   MAB   ELBOW SURGERY Left 01/26/2011   No BP lt arm due to titanum rod   ELBOW SURGERY     FRACTURE SURGERY  rt foot-1990   HYSTEROSCOPY WITH D & C  06/09/2011   Procedure: DILATATION AND CURETTAGE /HYSTEROSCOPY;  Surgeon: Lavina Hamman, MD;  Location: [facility];  Service: Gynecology;  Laterality: N/A;   KNEE SURGERY  10/12   right knee   LUMBAR LAMINECTOMY  1991   LYMPH NODE BIOPSY     right breast   RADIAL HEAD IMPLANT  02/01/2011   Procedure: RADIAL HEAD IMPLANT;  Surgeon: Marlowe Shores, MD;  Location: [facility];  Service: Orthopedics;  Laterality: Left;  left radial head replacement   SVD     x 3      Social History:     Social History   Tobacco Use   Smoking status: Never   Smokeless tobacco: Never  Substance Use Topics   Alcohol use: No       Family History :     Family History  Problem Relation Age of Onset   Heart disease Mother        MI's   CAD Mother    Diabetes Mother    Alzheimer's disease Father    Alcohol abuse Father        cirrhosis   CAD Brother    Diabetes Brother    Heart disease Brother        MI's      Home Medications:   Prior to Admission medications   Medication Sig Start Date End Date Taking? Authorizing Provider  amLODipine (NORVASC) 5 MG tablet Take 1  tablet (5 mg total) by mouth daily. 01/12/23  Yes Donita Brooks, MD  atorvastatin (LIPITOR) 20 MG tablet Take 1 tablet (20 mg total) by mouth daily. 01/12/23  Yes Donita Brooks, MD  cyclobenzaprine (FLEXERIL) 5 MG tablet Take 1 tablet (5 mg total) by mouth 3 (three) times daily as needed for muscle spasms. 12/11/22  Yes Donita Brooks, MD  famotidine (PEPCID) 40 MG tablet Take 1 tablet (40 mg total) by mouth daily. 01/12/23  Yes Donita Brooks, MD  furosemide (LASIX) 20 MG tablet Take 1 tablet (20 mg total) by mouth daily. 01/12/23  Yes Donita Brooks, MD  HYDROcodone bit-homatropine (HYCODAN) 5-1.5 MG/5ML syrup Take 5 mLs by mouth every 8 (eight) hours as needed for cough. 02/27/23  Yes Park Meo, FNP  metFORMIN (GLUCOPHAGE) 500 MG tablet Take 1 tablet (500 mg total) by mouth 2 (two) times daily with a meal. 03/05/23  Yes Howard, [facility] S, FNP  montelukast (SINGULAIR) 10 MG tablet Take 1 tablet (10 mg total) by mouth at bedtime. 01/12/23  Yes Donita Brooks, MD  pantoprazole (PROTONIX) 40 MG tablet Take 1 tablet (40 mg total) by mouth 2 (two) times daily. 01/12/23  Yes Donita Brooks, MD  pyridOXINE (VITAMIN B6) 100 MG tablet Take 1 tablet (100 mg total) by mouth daily. 05/30/22  Yes Donita Brooks, MD  Semaglutide, 1 MG/DOSE, (OZEMPIC, 1 MG/DOSE,) 4 MG/3ML SOPN Inject 1 mg as directed once a week. 01/12/23  Yes Donita Brooks, MD  Trospium Chloride 60 MG CP24 Take 1 capsule (60 mg total) by mouth daily. 01/12/23  Yes Marguerita Beards, MD  VITAMIN D PO Take 1 capsule by mouth daily.   Yes [provider]  hydrochlorothiazide (HYDRODIURIL) 25 MG tablet TAKE 1 TABLET BY MOUTH DAILY. 11/17/19 12/25/19  Donita Brooks, MD  valsartan (DIOVAN) 160 MG tablet Take 1 tablet (160 mg total) by mouth daily. Quit amlodipine 08/10/20 09/01/20  Donita Brooks, MD     Allergies:  Allergies  Allergen Reactions   Lisinopril Nausea And Vomiting     Physical Exam:    Vitals  Blood pressure 118/73, pulse 91, temperature 98 F (36.7 C), temperature source Oral, resp. rate (!) 27, height 5\' 5"  (1.651 m), weight 75.8 kg, SpO2 96%.   1. General Developed female, laying in bed, no apparent distress  2. Normal affect and insight, Not Suicidal or Homicidal, Awake Alert, Oriented X 3.  3. No F.N deficits, ALL C.Nerves Intact, Strength 5/5 all 4 extremities, Sensation intact all 4 extremities, Plantars down going.  4. Ears and Eyes appear Normal, Conjunctivae clear, PERRLA. Moist Oral Mucosa.  5. Supple Neck, No JVD, No cervical lymphadenopathy appriciated, No Carotid Bruits.  6. Symmetrical Chest wall movement, Good air movement bilaterally, CTAB.  7. RRR, No Gallops, +3/6 systolic murmurs, No Parasternal Heave.  8. Positive Bowel Sounds, Abdomen Soft, No tenderness, No organomegaly appriciated,No rebound -guarding or rigidity.  9.  No Cyanosis, Normal Skin Turgor, No Skin Rash or Bruise.  10. Good muscle tone,  joints appear normal , no effusions, Normal ROM.   Data Review:    CBC Recent Labs  Lab 03/13/23 1448  WBC 10.6*  HGB 13.6  HCT 41.8  PLT 284  MCV 85.0  MCH 27.6  MCHC 32.5  RDW 13.2   ------------------------------------------------------------------------------------------------------------------  Chemistries  Recent Labs  Lab 03/13/23 1448  NA 136  K 2.9*  CL 98  CO2 27  GLUCOSE 100*  BUN 15  CREATININE 1.10*  CALCIUM 10.2   ------------------------------------------------------------------------------------------------------------------ estimated creatinine clearance is 40.8 mL/min (A) (by C-G formula based on SCr of 1.1 mg/dL (H)). ------------------------------------------------------------------------------------------------------------------ No results for input(s): "TSH", "T4TOTAL", "T3FREE", "THYROIDAB" in the last 72 hours.  Invalid input(s): "FREET3"  Coagulation profile No results for input(s):  "INR", "PROTIME" in the last 168 hours. ------------------------------------------------------------------------------------------------------------------- No results for input(s): "DDIMER" in the last 72 hours. -------------------------------------------------------------------------------------------------------------------  Cardiac Enzymes No results for input(s): "CKMB", "TROPONINI", "MYOGLOBIN" in the last 168 hours.  Invalid input(s): "CK" ------------------------------------------------------------------------------------------------------------------ No results found for: "BNP"   ---------------------------------------------------------------------------------------------------------------  Urinalysis    Component Value Date/Time   COLORURINE YELLOW 05/07/2020 0043   APPEARANCEUR HAZY (A) 05/07/2020 0043   LABSPEC 1.020 05/07/2020 0043   PHURINE 5.0 05/07/2020 0043   GLUCOSEU NEGATIVE 05/07/2020 0043   HGBUR SMALL (A) 05/07/2020 0043   BILIRUBINUR negative 07/24/2022 1142   KETONESUR 5 (A) 05/07/2020 0043   PROTEINUR Negative 07/24/2022 1142   PROTEINUR 100 (A) 05/07/2020 0043   UROBILINOGEN 0.2 07/24/2022 1142   NITRITE negative 07/24/2022 1142   NITRITE NEGATIVE 05/07/2020 0043   LEUKOCYTESUR Negative 07/24/2022 1142   LEUKOCYTESUR NEGATIVE 05/07/2020 0043    ----------------------------------------------------------------------------------------------------------------   Imaging Results:    No results found.  EKG:  Vent. rate 79 BPM PR interval 180 ms QRS duration 97 ms QT/QTcB 407/467 ms P-R-T axes 67 21 32 Sinus rhythm Ventricular premature complex No acute changes No significant change since last tracing    Assessment & Plan:    Principal Problem:   Syncope Active Problems:   Diabetes mellitus type 2 with complications (HCC)   Essential hypertension   HLD (hyperlipidemia)   Syncope -Most likely in setting of volume depletion, dehydration,  orthostatic-hypotension in the setting of taking all her meds without much fluid or oral intake this morning, as well with poor oral intake in general with a viral illness last week, as well she took her Lasix, and took her cough meds this morning which includes hydrocodone, and  has hypokalemia. -No description of seizure-like activity, or seizures. -Will obtain CT head. -Will obtain 2D echo -Continue with IV fluids -Correct hypokalemia, I will add magnesium and phosphorus to previous labs -Monitor on telemetry -Continue to hold Lasix -Will check chest x-ray -Will check urinalysis -She denies any chest pain, no acute findings on EKG  AKI -Continue with IV fluids  Hypertension -She already took her blood pressure meds this morning, will resume tomorrow  Diabetes mellitus well-controlled with A1c of 5.8 last month - Will hold Ozempic and metformin, will keep an insulin sliding scale  Hyperlipidemia - Continue home dose statin, recent lab last month with LDL of 74.     DVT Prophylaxis  Lovenox   AM Labs Ordered, also please review Full Orders  Family Communication: Admission, patients condition and plan of care including tests being ordered have been discussed with the patient and husband at bedside who indicate understanding and agree with the plan and Code Status.  Code Status full code  Likely DC to home home  Consults called: None  Admission status: Observation  Time spent in minutes : 60 minutes   Huey Bienenstock M.D on 03/13/2023 at 4:56 PM   [facility] - Office  [phone]

## 2023-03-13 NOTE — ED Notes (Signed)
ED TO INPATIENT HANDOFF REPORT  ED Nurse Name and Phone #: Rodney Booze [phone]  S Name/Age/Gender Jennifer Wu 82 y.o. female Room/Bed: 023C/023C  Code Status   Code Status: Prior  Home/SNF/Other Home Patient oriented to: self, place, time, and situation Is this baseline? Yes   Triage Complete: Triage complete  Chief Complaint Syncope [R55]  Triage Note BIB [facility], patient lives at home, patient was at dry cleaners when she felt light headed and became pale, When Ems arrived, patient was lethargic, pale and altered, no nausea, vomiting, headache, chest pain or dizziness. No concerns for pain at this time   Initial manual BP by EMS was 70/47 and automatic BP was 200/140     Allergies Allergies  Allergen Reactions   Lisinopril Nausea And Vomiting    Level of Care/Admitting Diagnosis ED Disposition     ED Disposition  Admit   Condition  --   Comment  Hospital Area: [facility] [[id number]]  Level of Care: Telemetry Medical [104]  May place patient in observation at [facility] or [facility] if equivalent level of care is available:: No  Covid Evaluation: Asymptomatic - no recent exposure (last 10 days) testing not required  Diagnosis: Syncope [206001]  Admitting Physician: Chiquita Loth  Attending Physician: Randol Kern, DAWOOD S [[id number]]          B Medical/Surgery History 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   Seasonal allergies    Vitamin D deficiency    Past Surgical History:  Procedure Laterality Date   BACK  SURGERY     BREAST SURGERY     COLONOSCOPY     DILATATION & CURETTAGE/HYSTEROSCOPY WITH MYOSURE N/A 07/06/2022   Procedure: DILATATION & CURETTAGE/HYSTEROSCOPY WITH MYOSURE;  Surgeon: Lavina Hamman, MD;  Location: [facility];  Service: Gynecology;  Laterality: N/A;   DILATION AND CURETTAGE OF UTERUS  1976   MAB   ELBOW SURGERY Left 01/26/2011   No BP lt arm due to titanum rod   ELBOW SURGERY     FRACTURE SURGERY  rt foot-1990   HYSTEROSCOPY WITH D & C  06/09/2011   Procedure: DILATATION AND CURETTAGE /HYSTEROSCOPY;  Surgeon: Lavina Hamman, MD;  Location: [facility];  Service: Gynecology;  Laterality: N/A;   KNEE SURGERY  10/12   right knee   LUMBAR LAMINECTOMY  1991   LYMPH NODE BIOPSY     right breast   RADIAL HEAD IMPLANT  02/01/2011   Procedure: RADIAL HEAD IMPLANT;  Surgeon: Marlowe Shores, MD;  Location: [facility];  Service: Orthopedics;  Laterality: Left;  left radial head replacement   SVD     x 3     A IV Location/Drains/Wounds Patient Lines/Drains/Airways Status     Active Line/Drains/Airways     Name Placement date Placement time Site Days   Peripheral IV 03/13/23 20 G Left;Posterior Hand 03/13/23  --  Hand  less than 1   Airway 07/06/22  0916  -- 250  Intake/Output Last 24 hours No intake or output data in the 24 hours ending 03/13/23 1649  Labs/Imaging Results for orders placed or performed during the hospital encounter of 03/13/23 (from the past 48 hours)  CBG monitoring, ED     Status: Abnormal   Collection Time: 03/13/23  2:24 PM  Result Value Ref Range   Glucose-Capillary 106 (H) 70 - 99 mg/dL    Comment: Glucose reference range applies only to samples taken after fasting for at least 8 hours.  Basic metabolic panel     Status: Abnormal   Collection Time: 03/13/23  2:48 PM  Result Value Ref Range   Sodium 136 135 - 145 mmol/L   Potassium 2.9 (L) 3.5 - 5.1 mmol/L   Chloride 98 98 - 111 mmol/L   CO2 27 22  - 32 mmol/L   Glucose, Bld 100 (H) 70 - 99 mg/dL    Comment: Glucose reference range applies only to samples taken after fasting for at least 8 hours.   BUN 15 8 - 23 mg/dL   Creatinine, Ser 8.10 (H) 0.44 - 1.00 mg/dL   Calcium 17.5 8.9 - 10.2 mg/dL   GFR, Estimated 50 (L) >60 mL/min    Comment: (NOTE) Calculated using the CKD-EPI Creatinine Equation (2021)    Anion gap 11 5 - 15    Comment: Performed at [facility], [address], [place], [place] [address]  CBC     Status: Abnormal   Collection Time: 03/13/23  2:48 PM  Result Value Ref Range   WBC 10.6 (H) 4.0 - 10.5 K/uL   RBC 4.92 3.87 - 5.11 MIL/uL   Hemoglobin 13.6 12.0 - 15.0 g/dL   HCT 78.2 42.3 - 53.6 %   MCV 85.0 80.0 - 100.0 fL   MCH 27.6 26.0 - 34.0 pg   MCHC 32.5 30.0 - 36.0 g/dL   RDW 14.4 31.5 - 40.0 %   Platelets 284 150 - 400 K/uL   nRBC 0.0 0.0 - 0.2 %    Comment: Performed at [facility] [place] [facility], [address] [place], [place] [address]   No results found.  Pending Labs Unresulted Labs (From admission, onward)     Start     Ordered   03/13/23 1349  Urinalysis, Routine w reflex microscopic -Urine, Clean Catch  Once,   URGENT       Question:  Specimen Source  Answer:  Urine, Clean Catch   03/13/23 1414            Vitals/Pain Today's Vitals   03/13/23 1400 03/13/23 1415 03/13/23 1430 03/13/23 1500  BP: 135/75 128/77 128/63 118/73  Pulse: 85 85 85 91  Resp: 13 15 (!) 25 (!) 27  Temp:      TempSrc:      SpO2: 100% 100% 96% 96%  Weight:      Height:      PainSc:        Isolation Precautions No active isolations  Medications Medications - No data to display  Mobility walks     Focused Assessments Cardiac Assessment Handoff:    No results found for: "CKTOTAL", "CKMB", "CKMBINDEX", "TROPONINI" Lab Results  Component Value Date   DDIMER 1.41 (H) 05/09/2020   Does the Patient currently have chest pain? No    R Recommendations: See Admitting Provider Note  Report given  to:   Additional Notes:

## 2023-03-13 NOTE — ED Triage Notes (Signed)
BIB [facility], patient lives at home, patient was at dry cleaners when she felt light headed and became pale, When Ems arrived, patient was lethargic, pale and altered, no nausea, vomiting, headache, chest pain or dizziness. No concerns for pain at this time   Initial manual BP by EMS was 70/47 and automatic BP was 200/140

## 2023-03-13 NOTE — ED Provider Notes (Signed)
[place] EM[facility] DEPARTMENT AT [facility] Provider Note   CSN: [id number] Arrival date & time: 03/13/23  1258     History  Chief Complaint  Patient presents with   Near Syncope    Jennifer Wu is a 82 y.o. female.   Near Syncope  Patient presents to the ED with daughter and husband after an episode of lightheadedness, confusion lasting about 20 minutes.  Previous medical history of asthma, type 2 diabetes, HTN, HLD.  She also states that she had been previously seen on 12/3 for URI but does not believe it was a URI and was just her chronic cough.  EMS reported that she was lightheaded, pale, altered on arrival with manual BP being 70/47 and automatic BP being 200/140.  Patient reports feeling better in the ambulance.  She states that she had a doughnut prior and feels better now and is wishing to go home.  She believes that this was associated with her taking all her medication in the morning without any food.  States she is asymptomatic.  Denies CVA history, seizures.  Denies trauma, blood thinners, loss of consciousness, nausea, vomiting, chest pain, shortness of breath.  Patient's second daughter who was on scene with patient reports that she had fluttering eyes, brought arm up to chest but was answering questions appropriately throughout the event.  Did not have facial asymmetry.  She believes that the patient lost consciousness for a few minutes as patient believes that EMS arrived more quickly than they did.     Home Medications Prior to Admission medications   Medication Sig Start Date End Date Taking? Authorizing Provider  albuterol (VENTOLIN HFA) 108 (90 Base) MCG/ACT inhaler Inhale 1-2 puffs into the lungs every 6 (six) hours as needed for wheezing or shortness of breath. 08/08/21   Mickie Bail, NP  amLODipine (NORVASC) 5 MG tablet Take 1 tablet (5 mg total) by mouth daily. 01/12/23   Donita Brooks, MD  atorvastatin (LIPITOR) 20 MG tablet Take 1  tablet (20 mg total) by mouth daily. 01/12/23   Donita Brooks, MD  cyclobenzaprine (FLEXERIL) 5 MG tablet Take 1 tablet (5 mg total) by mouth 3 (three) times daily as needed for muscle spasms. 12/11/22   Donita Brooks, MD  famotidine (PEPCID) 40 MG tablet Take 1 tablet (40 mg total) by mouth daily. 01/12/23   Donita Brooks, MD  furosemide (LASIX) 20 MG tablet Take 1 tablet (20 mg total) by mouth daily. 01/12/23   Donita Brooks, MD  HYDROcodone bit-homatropine (HYCODAN) 5-1.5 MG/5ML syrup Take 5 mLs by mouth every 8 (eight) hours as needed for cough. 02/27/23   Park Meo, FNP  metFORMIN (GLUCOPHAGE) 500 MG tablet Take 1 tablet (500 mg total) by mouth 2 (two) times daily with a meal. 03/05/23   Dimas Aguas, Binnie Rail, FNP  montelukast (SINGULAIR) 10 MG tablet Take 1 tablet (10 mg total) by mouth at bedtime. 01/12/23   Donita Brooks, MD  pantoprazole (PROTONIX) 40 MG tablet Take 1 tablet (40 mg total) by mouth 2 (two) times daily. 01/12/23   Donita Brooks, MD  pyridOXINE (VITAMIN B6) 100 MG tablet Take 1 tablet (100 mg total) by mouth daily. 05/30/22   Donita Brooks, MD  Semaglutide, 1 MG/DOSE, (OZEMPIC, 1 MG/DOSE,) 4 MG/3ML SOPN Inject 1 mg as directed once a week. 01/12/23   Donita Brooks, MD  Trospium Chloride 60 MG CP24 Take 1 capsule (60 mg total) by mouth  daily. 01/12/23   Marguerita Beards, MD  VITAMIN D PO Take 1 capsule by mouth daily.    [provider]  hydrochlorothiazide (HYDRODIURIL) 25 MG tablet TAKE 1 TABLET BY MOUTH DAILY. 11/17/19 12/25/19  Donita Brooks, MD  valsartan (DIOVAN) 160 MG tablet Take 1 tablet (160 mg total) by mouth daily. Quit amlodipine 08/10/20 09/01/20  Donita Brooks, MD      Allergies    Lisinopril    Review of Systems   Review of Systems  Cardiovascular:  Positive for near-syncope.  Neurological:  Positive for dizziness.  Psychiatric/Behavioral:  Positive for confusion.   All other systems reviewed and are  negative.   Physical Exam Updated Vital Signs BP 118/73   Pulse 91   Temp 98 F (36.7 C) (Oral)   Resp (!) 27   Ht 5\' 5"  (1.651 m)   Wt 75.8 kg   SpO2 96%   BMI 27.79 kg/m  Physical Exam Vitals and nursing note reviewed.  Constitutional:      Appearance: Normal appearance.  HENT:     Head: Normocephalic and atraumatic.     Right Ear: Tympanic membrane, ear canal and external ear normal. There is no impacted cerumen.     Left Ear: Tympanic membrane, ear canal and external ear normal. There is no impacted cerumen.     Nose: No congestion or rhinorrhea.     Mouth/Throat:     Mouth: Mucous membranes are moist.     Pharynx: Oropharynx is clear. No oropharyngeal exudate or posterior oropharyngeal erythema.  Eyes:     General: No scleral icterus.       Right eye: No discharge.        Left eye: No discharge.     Extraocular Movements: Extraocular movements intact.     Conjunctiva/sclera: Conjunctivae normal.     Pupils: Pupils are equal, round, and reactive to light.  Cardiovascular:     Rate and Rhythm: Normal rate and regular rhythm.     Pulses: Normal pulses.     Heart sounds: Normal heart sounds. No murmur heard. Pulmonary:     Effort: Pulmonary effort is normal. No respiratory distress.     Breath sounds: Normal breath sounds. No stridor. No wheezing or rales.  Abdominal:     General: Abdomen is flat.     Palpations: Abdomen is soft.     Tenderness: There is no abdominal tenderness.  Musculoskeletal:        General: No swelling, tenderness, deformity or signs of injury.     Cervical back: Normal range of motion. No rigidity or tenderness.  Skin:    General: Skin is warm and dry.     Coloration: Skin is not jaundiced or pale.     Findings: No erythema.  Neurological:     General: No focal deficit present.     Mental Status: She is alert and oriented to person, place, and time. Mental status is at baseline.     Cranial Nerves: No cranial nerve deficit.     Sensory:  No sensory deficit.     Motor: No weakness.     Coordination: Coordination normal.     Gait: Gait normal.     Comments: Negative arm drift  Psychiatric:        Mood and Affect: Mood normal.        Behavior: Behavior normal.        Thought Content: Thought content normal.  Judgment: Judgment normal.     ED Results / Procedures / Treatments   Labs (all labs ordered are listed, but only abnormal results are displayed) Labs Reviewed  BASIC METABOLIC PANEL - Abnormal; Notable for the following components:      Result Value   Potassium 2.9 (*)    Glucose, Bld 100 (*)    Creatinine, Ser 1.10 (*)    GFR, Estimated 50 (*)    All other components within normal limits  CBC - Abnormal; Notable for the following components:   WBC 10.6 (*)    All other components within normal limits  CBG MONITORING, ED - Abnormal; Notable for the following components:   Glucose-Capillary 106 (*)    All other components within normal limits  URINALYSIS, ROUTINE W REFLEX MICROSCOPIC    EKG EKG Interpretation Date/Time:  Tuesday March 13 2023 13:31:32 EST Ventricular Rate:  79 PR Interval:  180 QRS Duration:  97 QT Interval:  407 QTC Calculation: 467 R Axis:   21  Text Interpretation: Sinus rhythm Ventricular premature complex No acute changes No significant change since last tracing Confirmed by Derwood Kaplan [phone]) on 03/13/2023 1:48:19 PM  Radiology No results found.  Procedures Procedures    Medications Ordered in ED Medications  0.9 % NaCl with KCl 20 mEq/ L  infusion (has no administration in time range)    ED Course/ Medical Decision Making/ A&P                                 Medical Decision Making Amount and/or Complexity of Data Reviewed Labs: ordered.   This patient is a 82 year old female who presents to the ED for concern of confusion and lightheadedness.   Differential diagnoses prior to evaluation: The emergent differential diagnosis includes, but is  not limited to, hypoglycemia, hypovolemia, hemorrhage, trauma, CVA, electrolyte abnormality, TIA, seizure. This is not an exhaustive differential.   Past Medical History / Co-morbidities / Social History: As chronic cough, diabetes type 2, hypertension, HLD.  Additional history: Chart reviewed. Pertinent results include: Previously seen on 12/3 for URI treated with symptomatic treatment.  Lab Tests/Imaging studies: I personally interpreted labs/imaging and the pertinent results include:   UA pending BMP pending CBC shows mildly elevated WBC at 10.6 with no signs of anemia. CBG 106   Cardiac monitoring: EKG obtained and interpreted by myself and attending physician which shows:  normal sinus rhythm with PVCs   Medications: No medications necessary for this visit at this time.  I have reviewed the patients home medicines and have made adjustments as needed.  ED Course:  Patient is a 82 year old female presents to the ED today with 1 episode of confusion and lightheadedness which lasted about 20 minutes.  She was brought to the ED via EMS who reported patient was pale, lethargic, altered.  BP uncertain at time as manual and automatic pressures vary drastically.  On arrival patient immediately asked to leave as she fell asymptomatic.  However due to history, despite no symptoms currently, ordered baseline labs to evaluate status and  unknown etiology for this event.  No CVA history, clotting history.  Patient's physical exam was also entirely benign with patient able to ambulate without difficulty including touching her toes without prompting.  Neuroexam was also benign with patient endorsing appropriate sensation and ambulation. Negative Romberg.  Could be possible seizure, TIA.  However no postictal state was noted for patient.  Could be  possible TIA. Considered CT, Doppler ultrasound of carotids to evaluate possible cause for this event.  Patient second daughter then came and witnessed the  event.  She provided more history stating that she believes that she had a period of LOC, seizure, strokelike symptoms.  Due to this newer history, will continue to try and admit.  Due to patient's unknown etiology behind this event with possible LOC, seizure, TIA will try to admit for continued monitoring.   Disposition: 3:49 PM Care of [name] transferred to Dr. Jeraldine Loots at the end of my shift as the patient will require reassessment once labs/imaging have resulted. Patient presentation, ED course, and plan of care discussed with review of all pertinent labs and imaging. Please see his/her note for further details regarding further ED course and disposition. Plan at time of handoff is evaluate labs and CT to admit patient for further monitoring to ensure no repeat episodes. This may be altered or completely changed at the discretion of the oncoming team pending results of further workup.   Final Clinical Impression(s) / ED Diagnoses Final diagnoses:  Dizziness    Rx / DC Orders ED Discharge Orders     None      Portions of this report may have been transcribed using voice recognition software. Every effort was made to ensure accuracy; however, inadvertent computerized transcription errors may be present.    Lunette Stands, [place] 03/13/23 1549    Derwood Kaplan, MD 03/15/23 [phone]

## 2023-03-14 ENCOUNTER — Observation Stay (HOSPITAL_COMMUNITY): Payer: Medicare PPO

## 2023-03-14 DIAGNOSIS — Z853 Personal history of malignant neoplasm of breast: Secondary | ICD-10-CM | POA: Diagnosis not present

## 2023-03-14 DIAGNOSIS — R55 Syncope and collapse: Secondary | ICD-10-CM | POA: Diagnosis not present

## 2023-03-14 DIAGNOSIS — E663 Overweight: Secondary | ICD-10-CM | POA: Diagnosis not present

## 2023-03-14 DIAGNOSIS — Z6827 Body mass index (BMI) 27.0-27.9, adult: Secondary | ICD-10-CM | POA: Diagnosis not present

## 2023-03-14 DIAGNOSIS — E118 Type 2 diabetes mellitus with unspecified complications: Secondary | ICD-10-CM

## 2023-03-14 DIAGNOSIS — I10 Essential (primary) hypertension: Secondary | ICD-10-CM | POA: Diagnosis not present

## 2023-03-14 DIAGNOSIS — E119 Type 2 diabetes mellitus without complications: Secondary | ICD-10-CM | POA: Diagnosis not present

## 2023-03-14 DIAGNOSIS — Z79899 Other long term (current) drug therapy: Secondary | ICD-10-CM | POA: Diagnosis not present

## 2023-03-14 DIAGNOSIS — E876 Hypokalemia: Secondary | ICD-10-CM | POA: Insufficient documentation

## 2023-03-14 DIAGNOSIS — Z23 Encounter for immunization: Secondary | ICD-10-CM | POA: Diagnosis not present

## 2023-03-14 LAB — CBC
HCT: 37.4 % (ref 36.0–46.0)
Hemoglobin: 12.2 g/dL (ref 12.0–15.0)
MCH: 27.4 pg (ref 26.0–34.0)
MCHC: 32.6 g/dL (ref 30.0–36.0)
MCV: 84 fL (ref 80.0–100.0)
Platelets: 258 10*3/uL (ref 150–400)
RBC: 4.45 MIL/uL (ref 3.87–5.11)
RDW: 13.2 % (ref 11.5–15.5)
WBC: 7.4 10*3/uL (ref 4.0–10.5)
nRBC: 0 % (ref 0.0–0.2)

## 2023-03-14 LAB — ECHOCARDIOGRAM COMPLETE
AR max vel: 1.43 cm2
AV Area VTI: 1.61 cm2
AV Area mean vel: 1.24 cm2
AV Mean grad: 5 mm[Hg]
AV Peak grad: 10.4 mm[Hg]
Ao pk vel: 1.61 m/s
Area-P 1/2: 3.17 cm2
Calc EF: 57.1 %
Height: 65 in
MV VTI: 2 cm2
S' Lateral: 2.3 cm
Single Plane A2C EF: 58.1 %
Single Plane A4C EF: 55.4 %
Weight: 2649.05 [oz_av]

## 2023-03-14 LAB — GLUCOSE, CAPILLARY
Glucose-Capillary: 104 mg/dL — ABNORMAL HIGH (ref 70–99)
Glucose-Capillary: 102 mg/dL — ABNORMAL HIGH (ref 70–99)
Glucose-Capillary: 114 mg/dL — ABNORMAL HIGH (ref 70–99)

## 2023-03-14 LAB — BASIC METABOLIC PANEL
Anion gap: 9 (ref 5–15)
BUN: 15 mg/dL (ref 8–23)
CO2: 27 mmol/L (ref 22–32)
Calcium: 9.4 mg/dL (ref 8.9–10.3)
Chloride: 103 mmol/L (ref 98–111)
Creatinine, Ser: 0.82 mg/dL (ref 0.44–1.00)
GFR, Estimated: >60 mL/min (ref >60)
Glucose, Bld: 100 mg/dL — ABNORMAL HIGH (ref 70–99)
Potassium: 3.7 mmol/L (ref 3.5–5.1)
Sodium: 139 mmol/L (ref 135–145)

## 2023-03-14 NOTE — Discharge Summary (Signed)
[facility] Physician Discharge Summary   Patient name: Jennifer Wu  Admit date:     03/13/2023  Discharge date: 03/14/2023  Attending Physician: Starleen Arms [[id number]  Discharge Physician: Carollee Herter   PCP: Donita Brooks, MD  Admitted From: Home Disposition:  Home  Recommendations for Outpatient Follow-up:  Follow up with PCP in 1-2 weeks Start keeping BP log to show your PCP at next office visit  Home Health:No Equipment/Devices: None  Discharge Condition:Stable CODE STATUS:FULL Diet recommendation: Heart Healthy Fluid Restriction: None  Hospital Summary: HPI: Jennifer Wu  is a 82 y.o. female, past medical history of hypertension, hyperlipidemia, type 2 diabetes mellitus, presents to ED secondary to syncope, patient presents, family at bedside at this with a history, patient was seen recently for URI symptoms, 02/27/2023, she is with known underlying chronic cough, but she has been having some congestion, it was in the [place], doing some laundry, where she had an episode of syncope, patient reports she does not think she lost consciousness totally, but second-order at the scene thinks she did lose consciousness for some minutes, with some fluttering eyes, not have facial asymmetry, patient does report some lightheadedness 20 minutes preceding the presyncope, denies any focal deficits, tingling, numbness, she reports she took all her meds this morning but she did not eat or drink much. -In ED her workup was significant for elevated creatinine at 1.1, low sodium at 2.9, white blood cell count of 10.5, her EKG with no acute findings, chest x-ray, CT head still pending, she was not orthostatic, [facility] consulted to admit.  Significant Events: Admitted 03/13/2023 for syncope   Significant Labs: elevated creatinine at 1.1, low sodium at 2.9, white blood cell count of 10.5,   Significant Imaging Studies: CT head shows No CT evidence for acute  intracranial abnormality. CXR shows Low lung volumes with bronchovascular crowding. Subsegmental atelectasis or scar at the left lung base  Antibiotic Therapy: Anti-infectives (From admission, onward)    None       Procedures:   Consultants:    Hospital Course by Problem: * Syncope 03-14-2023 likely due to hypotension from taking her norvasc, lasix and hydocodone together without any food or liquids. Pt noted to be hypotensive with SBP in the 70s when EMS arrived on seen. Workup negative. Echo normal LVEF 65%. No arrhythmias on telemetry. Orthostatic VS normal. Stable for DC.  HLD (hyperlipidemia) 03-14-2023 stable. Continue statin.  Essential hypertension 03-14-2023 family and pt states she has lost about 60 lbs on ozempic. I asked pt to start keeping BP log with entries before and after taking her HTN meds. I asked her to f/u with PCP to ensure she still needs HTN meds after 60 lbs weight loss.  Diabetes mellitus type 2 with complications (HCC) 03-14-2023 stable.  Hypokalemia 03-14-2023 resolved with replacement.  Overweight (BMI 25.0-29.9) BMI 27.55    Discharge Diagnoses:  Principal Problem:   Syncope Active Problems:   Diabetes mellitus type 2 with complications (HCC)   Essential hypertension   HLD (hyperlipidemia)   Overweight (BMI 25.0-29.9)   Hypokalemia   Discharge Instructions  Discharge Instructions     Call MD for:  difficulty breathing, headache or visual disturbances   Complete by: As directed    Call MD for:  extreme fatigue   Complete by: As directed    Call MD for:  hives   Complete by: As directed    Call MD for:  persistant dizziness or light-headedness   Complete by:  As directed    Call MD for:  persistant nausea and vomiting   Complete by: As directed    Call MD for:  redness, tenderness, or signs of infection (pain, swelling, redness, odor or green/yellow discharge around incision site)   Complete by: As directed    Call MD for:   severe uncontrolled pain   Complete by: As directed    Call MD for:  temperature >100.4   Complete by: As directed    Diet - low sodium heart healthy   Complete by: As directed    Discharge instructions   Complete by: As directed    1. Follow up with your primary care provider in 1-2 weeks. 2. Keep blood pressure logs of your BP before and after taking your blood pressure medications.   Increase activity slowly   Complete by: As directed       Allergies as of 03/14/2023       Reactions   Lisinopril Nausea And Vomiting        Medication List     TAKE these medications    amLODipine 5 MG tablet Commonly known as: NORVASC Take 1 tablet (5 mg total) by mouth daily.   atorvastatin 20 MG tablet Commonly known as: LIPITOR Take 1 tablet (20 mg total) by mouth daily.   cyclobenzaprine 5 MG tablet Commonly known as: FLEXERIL Take 1 tablet (5 mg total) by mouth 3 (three) times daily as needed for muscle spasms.   famotidine 40 MG tablet Commonly known as: PEPCID Take 1 tablet (40 mg total) by mouth daily.   furosemide 20 MG tablet Commonly known as: LASIX Take 1 tablet (20 mg total) by mouth daily.   HYDROcodone bit-homatropine 5-1.5 MG/5ML syrup Commonly known as: HYCODAN Take 5 mLs by mouth every 8 (eight) hours as needed for cough.   metFORMIN 500 MG tablet Commonly known as: GLUCOPHAGE Take 1 tablet (500 mg total) by mouth 2 (two) times daily with a meal.   montelukast 10 MG tablet Commonly known as: SINGULAIR Take 1 tablet (10 mg total) by mouth at bedtime.   Ozempic (1 MG/DOSE) 4 MG/3ML Sopn Generic drug: Semaglutide (1 MG/DOSE) Inject 1 mg as directed once a week.   pantoprazole 40 MG tablet Commonly known as: PROTONIX Take 1 tablet (40 mg total) by mouth 2 (two) times daily.   pyridOXINE 100 MG tablet Commonly known as: VITAMIN B6 Take 1 tablet (100 mg total) by mouth daily.   Trospium Chloride 60 MG Cp24 Take 1 capsule (60 mg total) by mouth  daily.   VITAMIN D PO Take 1 capsule by mouth daily.        Allergies  Allergen Reactions   Lisinopril Nausea And Vomiting    Discharge Exam: Vitals:   03/14/23 0552 03/14/23 0757  BP: (!) 141/68 120/72  Pulse: 87 81  Resp: 18   Temp: 98.4 F (36.9 C)   SpO2: 92% 96%    Physical Exam Vitals and nursing note reviewed.  Constitutional:      General: She is not in acute distress.    Appearance: She is not toxic-appearing.  HENT:     Head: Normocephalic and atraumatic.  Eyes:     General: No scleral icterus. Cardiovascular:     Rate and Rhythm: Normal rate and regular rhythm.  Pulmonary:     Effort: Pulmonary effort is normal.     Breath sounds: Normal breath sounds.  Abdominal:     General: Bowel sounds are normal.  Palpations: Abdomen is soft.  Skin:    General: Skin is warm and dry.     Capillary Refill: Capillary refill takes less than 2 seconds.  Neurological:     Mental Status: She is alert and oriented to person, place, and time.     The results of significant diagnostics from this hospitalization (including imaging, microbiology, ancillary and laboratory) are listed below for reference.     Labs:  Basic Metabolic Panel: Recent Labs  Lab 03/13/23 1448 03/14/23 0631  NA 136 139  K 2.9* 3.7  CL 98 103  CO2 27 27  GLUCOSE 100* 100*  BUN 15 15  CREATININE 1.10* 0.82  CALCIUM 10.2 9.4  MG 1.4*  --   PHOS 3.7  --    CBC: Recent Labs  Lab 03/13/23 1448 03/14/23 0631  WBC 10.6* 7.4  HGB 13.6 12.2  HCT 41.8 37.4  MCV 85.0 84.0  PLT 284 258   CBG: Recent Labs  Lab 03/13/23 1424 03/13/23 2144 03/14/23 0526 03/14/23 0757 03/14/23 1141  GLUCAP 106* 120* 104* 102* 114*   Sepsis Labs Recent Labs  Lab 03/13/23 1448 03/14/23 0631  WBC 10.6* 7.4    Procedures/Studies: ECHOCARDIOGRAM COMPLETE Result Date: 03/14/2023    ECHOCARDIOGRAM REPORT   Patient Name:   YUVIA KIROUAC Date of Exam: 03/14/2023 Medical Rec #:  [id number]        Height:       65.0 in Accession #:    [id number]      Weight:       165.6 lb Date of Birth:  1941/01/16      BSA:          1.825 m Patient Age:    82 years        BP:           120/72 mmHg Patient Gender: F               HR:           83 bpm. Exam Location:  Inpatient Procedure: 2D Echo, Cardiac Doppler, Color Doppler and Strain Analysis Indications:    Syncope  History:        Patient has prior history of Echocardiogram examinations, most                 recent 03/16/2021. Risk Factors:Hypertension, Diabetes and                 Dyslipidemia.  Sonographer:    Vern Claude Referring Phys: [id number] DAWOOD S ELGERGAWY IMPRESSIONS  1. Left ventricular ejection fraction, by estimation, is 60 to 65%. The left ventricle has normal function. The left ventricle has no regional wall motion abnormalities. There is mild asymmetric left ventricular hypertrophy of the basal-septal segment. Left ventricular diastolic parameters are indeterminate.  2. Right ventricular systolic function is normal. The right ventricular size is normal.  3. Left atrial size was moderately dilated.  4. The mitral valve is degenerative. Trivial mitral valve regurgitation. Mild mitral stenosis.  5. The aortic valve is tricuspid. There is moderate calcification of the aortic valve. Aortic valve regurgitation is not visualized. Mild aortic valve stenosis. Aortic valve area, by VTI measures 1.61 cm. Aortic valve mean gradient measures 5.0 mmHg. Aortic valve Vmax measures 1.61 m/s.  6. The inferior vena cava is normal in size with <50% respiratory variability, suggesting right atrial pressure of 8 mmHg. FINDINGS  Left Ventricle: Left ventricular ejection fraction, by estimation, is 60 to 65%. The left  ventricle has normal function. The left ventricle has no regional wall motion abnormalities. The global longitudinal strain is normal despite suboptimal segment tracking. The left ventricular internal cavity size was normal in size. There is mild asymmetric  left ventricular hypertrophy of the basal-septal segment. Left ventricular diastolic parameters are indeterminate. Right Ventricle: The right ventricular size is normal. No increase in right ventricular wall thickness. Right ventricular systolic function is normal. Left Atrium: Left atrial size was moderately dilated. Right Atrium: Right atrial size was normal in size. Pericardium: There is no evidence of pericardial effusion. Mitral Valve: The mitral valve is degenerative in appearance. Mild mitral annular calcification. Trivial mitral valve regurgitation. Mild mitral valve stenosis. MV peak gradient, 6.7 mmHg. The mean mitral valve gradient is 3.0 mmHg. Tricuspid Valve: The tricuspid valve is normal in structure. Tricuspid valve regurgitation is trivial. No evidence of tricuspid stenosis. Aortic Valve: The aortic valve is tricuspid. There is moderate calcification of the aortic valve. Aortic valve regurgitation is not visualized. Mild aortic stenosis is present. Aortic valve mean gradient measures 5.0 mmHg. Aortic valve peak gradient measures 10.4 mmHg. Aortic valve area, by VTI measures 1.61 cm. Pulmonic Valve: The pulmonic valve was normal in structure. Pulmonic valve regurgitation is not visualized. No evidence of pulmonic stenosis. Aorta: The aortic root is normal in size and structure. Venous: The inferior vena cava is normal in size with less than 50% respiratory variability, suggesting right atrial pressure of 8 mmHg. IAS/Shunts: No atrial level shunt detected by color flow Doppler.  LEFT VENTRICLE PLAX 2D LVIDd:         4.10 cm      Diastology LVIDs:         2.30 cm      LV e' medial:    6.57 cm/s LV PW:         0.90 cm      LV E/e' medial:  12.4 LV IVS:        1.30 cm      LV e' lateral:   5.64 cm/s LVOT diam:     1.80 cm      LV E/e' lateral: 14.5 LV SV:         47 LV SV Index:   26 LVOT Area:     2.54 cm  LV Volumes (MOD) LV vol d, MOD A2C: 85.4 ml LV vol d, MOD A4C: 109.0 ml LV vol s, MOD A2C: 35.8  ml LV vol s, MOD A4C: 48.6 ml LV SV MOD A2C:     49.6 ml LV SV MOD A4C:     109.0 ml LV SV MOD BP:      55.7 ml RIGHT VENTRICLE             IVC RV Basal diam:  3.60 cm     IVC diam: 1.90 cm RV Mid diam:    2.80 cm RV S prime:     11.00 cm/s TAPSE (M-mode): 2.8 cm LEFT ATRIUM           Index        RIGHT ATRIUM           Index LA diam:      4.30 cm 2.36 cm/m   RA Area:     10.90 cm LA Vol (A2C): 61.6 ml 33.74 ml/m  RA Volume:   23.40 ml  12.82 ml/m LA Vol (A4C): 82.5 ml 45.19 ml/m  AORTIC VALVE  PULMONIC VALVE AV Area (Vmax):    1.43 cm      PV Vmax:       0.88 m/s AV Area (Vmean):   1.24 cm      PV Peak grad:  3.1 mmHg AV Area (VTI):     1.61 cm AV Vmax:           161.00 cm/s AV Vmean:          111.000 cm/s AV VTI:            0.292 m AV Peak Grad:      10.4 mmHg AV Mean Grad:      5.0 mmHg LVOT Vmax:         90.60 cm/s LVOT Vmean:        54.100 cm/s LVOT VTI:          0.185 m LVOT/AV VTI ratio: 0.63  AORTA Ao Root diam: 3.00 cm Ao Asc diam:  3.10 cm MITRAL VALVE MV Area (PHT): 3.17 cm     SHUNTS MV Area VTI:   2.00 cm     Systemic VTI:  0.18 m MV Peak grad:  6.7 mmHg     Systemic Diam: 1.80 cm MV Mean grad:  3.0 mmHg MV Vmax:       1.30 m/s MV Vmean:      83.2 cm/s MV Decel Time: 239 msec MV E velocity: 81.60 cm/s MV A velocity: 120.00 cm/s MV E/A ratio:  0.68 Arvilla Meres MD Electronically signed by Arvilla Meres MD Signature Date/Time: 03/14/2023/10:00:20 AM    Final    DG Chest Port 1 View Result Date: 03/13/2023 CLINICAL DATA:  Syncope. EXAM: PORTABLE CHEST 1 VIEW COMPARISON:  05/06/2020 FINDINGS: The heart is normal in size. Stable mediastinal contours. Aortic atherosclerosis. Chronically prominent hila likely due to enlarged central vascular structures. Low lung volumes with bronchovascular crowding. Subsegmental atelectasis or scar at the left lung base. No pleural fluid or pneumothorax. Right axillary surgical clips. IMPRESSION: Low lung volumes with bronchovascular  crowding. Subsegmental atelectasis or scar at the left lung base. Electronically Signed   By: Narda Rutherford M.D.   On: 03/13/2023 20:36   CT Head Wo Contrast Result Date: 03/13/2023 CLINICAL DATA:  Mental status change EXAM: CT HEAD WITHOUT CONTRAST TECHNIQUE: Contiguous axial images were obtained from the base of the skull through the vertex without intravenous contrast. RADIATION DOSE REDUCTION: This exam was performed according to the departmental dose-optimization program which includes automated exposure control, adjustment of the mA and/or kV according to patient size and/or use of iterative reconstruction technique. COMPARISON:  CT brain 01/23/2011 FINDINGS: Brain: No acute territorial infarction, hemorrhage or intracranial mass. Minimal white matter hypodensity. Nonenlarged ventricles. Vascular: No hyperdense vessels.  Carotid vascular calcification Skull: Normal. Negative for fracture or focal lesion. Sinuses/Orbits: Fluid level left maxillary sinus Other: None IMPRESSION: 1. No CT evidence for acute intracranial abnormality. 2. Fluid level in the left maxillary sinus, correlate for acute sinusitis. Electronically Signed   By: Jasmine Pang M.D.   On: 03/13/2023 18:45    Time coordinating discharge: 40 mins  SIGNED:  Carollee Herter, DO [facility] 03/14/23, 1:44 PM

## 2023-03-14 NOTE — Assessment & Plan Note (Signed)
03-14-2023 resolved with replacement.

## 2023-03-14 NOTE — Hospital Course (Signed)
HPI: Jennifer Wu  is a 82 y.o. female, past medical history of hypertension, hyperlipidemia, type 2 diabetes mellitus, presents to ED secondary to syncope, patient presents, family at bedside at this with a history, patient was seen recently for URI symptoms, 02/27/2023, she is with known underlying chronic cough, but she has been having some congestion, it was in the [place], doing some laundry, where she had an episode of syncope, patient reports she does not think she lost consciousness totally, but second-order at the scene thinks she did lose consciousness for some minutes, with some fluttering eyes, not have facial asymmetry, patient does report some lightheadedness 20 minutes preceding the presyncope, denies any focal deficits, tingling, numbness, she reports she took all her meds this morning but she did not eat or drink much. -In ED her workup was significant for elevated creatinine at 1.1, low sodium at 2.9, white blood cell count of 10.5, her EKG with no acute findings, chest x-ray, CT head still pending, she was not orthostatic, [facility] consulted to admit.  Significant Events: Admitted 03/13/2023 for syncope   Significant Labs: elevated creatinine at 1.1, low sodium at 2.9, white blood cell count of 10.5,   Significant Imaging Studies: CT head shows No CT evidence for acute intracranial abnormality. CXR shows Low lung volumes with bronchovascular crowding. Subsegmental atelectasis or scar at the left lung base  Antibiotic Therapy: Anti-infectives (From admission, onward)    None       Procedures:   Consultants:

## 2023-03-14 NOTE — Progress Notes (Signed)
  Echocardiogram 2D Echocardiogram has been performed.  Ocie Doyne RDCS 03/14/2023, 9:56 AM

## 2023-03-14 NOTE — Assessment & Plan Note (Signed)
BMI 27.55

## 2023-03-14 NOTE — Assessment & Plan Note (Signed)
03-14-2023 stable.

## 2023-03-14 NOTE — Assessment & Plan Note (Signed)
03-14-2023 likely due to hypotension from taking her norvasc, lasix and hydocodone together without any food or liquids. Pt noted to be hypotensive with SBP in the 70s when EMS arrived on seen. Workup negative. Echo normal LVEF 65%. No arrhythmias on telemetry. Orthostatic VS normal. Stable for DC.

## 2023-03-14 NOTE — Plan of Care (Signed)
  Problem: Metabolic: Goal: Ability to maintain appropriate glucose levels will improve Outcome: Progressing   Problem: Education: Goal: Knowledge of condition and prescribed therapy will improve Outcome: Progressing   Problem: Cardiac: Goal: Will achieve and/or maintain adequate cardiac output Outcome: Progressing   Problem: Physical Regulation: Goal: Complications related to the disease process, condition or treatment will be avoided or minimized Outcome: Progressing   Problem: Activity: Goal: Risk for activity intolerance will decrease Outcome: Progressing

## 2023-03-14 NOTE — Subjective & Objective (Signed)
Pt seen and examined.  Orthostatic VS performed this AM BP- Lying 137/79   Pulse- Lying 83   BP- Sitting 142/75   Pulse- Sitting 83   BP- Standing at 0 minutes 159/93   Pulse- Standing at 0 minutes 62

## 2023-03-14 NOTE — Progress Notes (Signed)
PROGRESS NOTE    Jennifer Wu  UEA:[id number] DOB: 09-30-1940 DOA: 03/13/2023 PCP: Donita Brooks, MD  Subjective: Pt seen and examined.  Orthostatic VS performed this AM BP- Lying 137/79   Pulse- Lying 83   BP- Sitting 142/75   Pulse- Sitting 83   BP- Standing at 0 minutes 159/93   Pulse- Standing at 0 minutes 62      Hospital Course: HPI: Jennifer Wu  is a 82 y.o. female, past medical history of hypertension, hyperlipidemia, type 2 diabetes mellitus, presents to ED secondary to syncope, patient presents, family at bedside at this with a history, patient was seen recently for URI symptoms, 02/27/2023, she is with known underlying chronic cough, but she has been having some congestion, it was in the [place], doing some laundry, where she had an episode of syncope, patient reports she does not think she lost consciousness totally, but second-order at the scene thinks she did lose consciousness for some minutes, with some fluttering eyes, not have facial asymmetry, patient does report some lightheadedness 20 minutes preceding the presyncope, denies any focal deficits, tingling, numbness, she reports she took all her meds this morning but she did not eat or drink much. -In ED her workup was significant for elevated creatinine at 1.1, low sodium at 2.9, white blood cell count of 10.5, her EKG with no acute findings, chest x-ray, CT head still pending, she was not orthostatic, [facility] hospitalist consulted to admit.  Significant Events: Admitted 03/13/2023 for syncope   Significant Labs: elevated creatinine at 1.1, low sodium at 2.9, white blood cell count of 10.5,   Significant Imaging Studies: CT head shows No CT evidence for acute intracranial abnormality. CXR shows Low lung volumes with bronchovascular crowding. Subsegmental atelectasis or scar at the left lung base  Antibiotic Therapy: Anti-infectives (From admission, onward)    None        Procedures:   Consultants:     Assessment and Plan: * Syncope 03-14-2023 likely due to hypotension from taking her norvasc, lasix and hydocodone together without any food or liquids. Pt noted to be hypotensive with SBP in the 70s when EMS arrived on seen. Workup negative. Echo normal LVEF 65%. No arrhythmias on telemetry. Orthostatic VS normal. Stable for DC.  HLD (hyperlipidemia) 03-14-2023 stable. Continue statin.  Essential hypertension 03-14-2023 family and pt states she has lost about 60 lbs on ozempic. I asked pt to start keeping BP log with entries before and after taking her HTN meds. I asked her to f/u with PCP to ensure she still needs HTN meds after 60 lbs weight loss.  Diabetes mellitus type 2 with complications (HCC) 03-14-2023 stable.  Hypokalemia 03-14-2023 resolved with replacement.  Overweight (BMI 25.0-29.9) BMI 27.55       DVT prophylaxis: enoxaparin (LOVENOX) injection 40 mg Start: 03/14/23 2000     Code Status: Full Code Family Communication: discussed with pt and family(husband and 2 dtrs) at bedside Disposition Plan: return home Reason for continuing need for hospitalization: stable for DC.  Objective: Vitals:   03/14/23 0008 03/14/23 0500 03/14/23 0552 03/14/23 0757  BP: 117/70  (!) 141/68 120/72  Pulse: 89  87 81  Resp: 17  18   Temp: 98.5 F (36.9 C)  98.4 F (36.9 C)   TempSrc: Oral  Oral   SpO2: 91%  92% 96%  Weight:  75.1 kg    Height:        Intake/Output Summary (Last 24 hours) at 03/14/2023 1341 Last data filed  at 03/14/2023 0455 Gross per 24 hour  Intake 835.05 ml  Output --  Net 835.05 ml   Filed Weights   03/13/23 1327 03/14/23 0500  Weight: 75.8 kg 75.1 kg    Examination:  Physical Exam Vitals and nursing note reviewed.  Constitutional:      General: She is not in acute distress.    Appearance: She is not toxic-appearing.  HENT:     Head: Normocephalic and atraumatic.  Eyes:     General: No scleral  icterus. Cardiovascular:     Rate and Rhythm: Normal rate and regular rhythm.  Pulmonary:     Effort: Pulmonary effort is normal.     Breath sounds: Normal breath sounds.  Abdominal:     General: Bowel sounds are normal.     Palpations: Abdomen is soft.  Skin:    General: Skin is warm and dry.     Capillary Refill: Capillary refill takes less than 2 seconds.  Neurological:     Mental Status: She is alert and oriented to person, place, and time.     Data Reviewed: I have personally reviewed following labs and imaging studies  CBC: Recent Labs  Lab 03/13/23 1448 03/14/23 0631  WBC 10.6* 7.4  HGB 13.6 12.2  HCT 41.8 37.4  MCV 85.0 84.0  PLT 284 258   Basic Metabolic Panel: Recent Labs  Lab 03/13/23 1448 03/14/23 0631  NA 136 139  K 2.9* 3.7  CL 98 103  CO2 27 27  GLUCOSE 100* 100*  BUN 15 15  CREATININE 1.10* 0.82  CALCIUM 10.2 9.4  MG 1.4*  --   PHOS 3.7  --    GFR: Estimated Creatinine Clearance: 54.5 mL/min (by C-G formula based on SCr of 0.82 mg/dL).  Recent Labs  Lab 03/13/23 1424 03/13/23 2144 03/14/23 0526 03/14/23 0757 03/14/23 1141  GLUCAP 106* 120* 104* 102* 114*   Radiology Studies: ECHOCARDIOGRAM COMPLETE Result Date: 03/14/2023    ECHOCARDIOGRAM REPORT   Patient Name:   JAIDEE Wu Date of Exam: 03/14/2023 Medical Rec #:  [id number]       Height:       65.0 in Accession #:    [id number]      Weight:       165.6 lb Date of Birth:  August 28, 1940      BSA:          1.825 m Patient Age:    82 years        BP:           120/72 mmHg Patient Gender: F               HR:           83 bpm. Exam Location:  Inpatient Procedure: 2D Echo, Cardiac Doppler, Color Doppler and Strain Analysis Indications:    Syncope  History:        Patient has prior history of Echocardiogram examinations, most                 recent 03/16/2021. Risk Factors:Hypertension, Diabetes and                 Dyslipidemia.  Sonographer:    Vern Claude Referring Phys: [id number] DAWOOD S  ELGERGAWY IMPRESSIONS  1. Left ventricular ejection fraction, by estimation, is 60 to 65%. The left ventricle has normal function. The left ventricle has no regional wall motion abnormalities. There is mild asymmetric left ventricular hypertrophy of the basal-septal segment. Left ventricular  diastolic parameters are indeterminate.  2. Right ventricular systolic function is normal. The right ventricular size is normal.  3. Left atrial size was moderately dilated.  4. The mitral valve is degenerative. Trivial mitral valve regurgitation. Mild mitral stenosis.  5. The aortic valve is tricuspid. There is moderate calcification of the aortic valve. Aortic valve regurgitation is not visualized. Mild aortic valve stenosis. Aortic valve area, by VTI measures 1.61 cm. Aortic valve mean gradient measures 5.0 mmHg. Aortic valve Vmax measures 1.61 m/s.  6. The inferior vena cava is normal in size with <50% respiratory variability, suggesting right atrial pressure of 8 mmHg. FINDINGS  Left Ventricle: Left ventricular ejection fraction, by estimation, is 60 to 65%. The left ventricle has normal function. The left ventricle has no regional wall motion abnormalities. The global longitudinal strain is normal despite suboptimal segment tracking. The left ventricular internal cavity size was normal in size. There is mild asymmetric left ventricular hypertrophy of the basal-septal segment. Left ventricular diastolic parameters are indeterminate. Right Ventricle: The right ventricular size is normal. No increase in right ventricular wall thickness. Right ventricular systolic function is normal. Left Atrium: Left atrial size was moderately dilated. Right Atrium: Right atrial size was normal in size. Pericardium: There is no evidence of pericardial effusion. Mitral Valve: The mitral valve is degenerative in appearance. Mild mitral annular calcification. Trivial mitral valve regurgitation. Mild mitral valve stenosis. MV peak gradient, 6.7  mmHg. The mean mitral valve gradient is 3.0 mmHg. Tricuspid Valve: The tricuspid valve is normal in structure. Tricuspid valve regurgitation is trivial. No evidence of tricuspid stenosis. Aortic Valve: The aortic valve is tricuspid. There is moderate calcification of the aortic valve. Aortic valve regurgitation is not visualized. Mild aortic stenosis is present. Aortic valve mean gradient measures 5.0 mmHg. Aortic valve peak gradient measures 10.4 mmHg. Aortic valve area, by VTI measures 1.61 cm. Pulmonic Valve: The pulmonic valve was normal in structure. Pulmonic valve regurgitation is not visualized. No evidence of pulmonic stenosis. Aorta: The aortic root is normal in size and structure. Venous: The inferior vena cava is normal in size with less than 50% respiratory variability, suggesting right atrial pressure of 8 mmHg. IAS/Shunts: No atrial level shunt detected by color flow Doppler.  LEFT VENTRICLE PLAX 2D LVIDd:         4.10 cm      Diastology LVIDs:         2.30 cm      LV e' medial:    6.57 cm/s LV PW:         0.90 cm      LV E/e' medial:  12.4 LV IVS:        1.30 cm      LV e' lateral:   5.64 cm/s LVOT diam:     1.80 cm      LV E/e' lateral: 14.5 LV SV:         47 LV SV Index:   26 LVOT Area:     2.54 cm  LV Volumes (MOD) LV vol d, MOD A2C: 85.4 ml LV vol d, MOD A4C: 109.0 ml LV vol s, MOD A2C: 35.8 ml LV vol s, MOD A4C: 48.6 ml LV SV MOD A2C:     49.6 ml LV SV MOD A4C:     109.0 ml LV SV MOD BP:      55.7 ml RIGHT VENTRICLE             IVC RV Basal diam:  3.60 cm  IVC diam: 1.90 cm RV Mid diam:    2.80 cm RV S prime:     11.00 cm/s TAPSE (M-mode): 2.8 cm LEFT ATRIUM           Index        RIGHT ATRIUM           Index LA diam:      4.30 cm 2.36 cm/m   RA Area:     10.90 cm LA Vol (A2C): 61.6 ml 33.74 ml/m  RA Volume:   23.40 ml  12.82 ml/m LA Vol (A4C): 82.5 ml 45.19 ml/m  AORTIC VALVE                     PULMONIC VALVE AV Area (Vmax):    1.43 cm      PV Vmax:       0.88 m/s AV Area (Vmean):    1.24 cm      PV Peak grad:  3.1 mmHg AV Area (VTI):     1.61 cm AV Vmax:           161.00 cm/s AV Vmean:          111.000 cm/s AV VTI:            0.292 m AV Peak Grad:      10.4 mmHg AV Mean Grad:      5.0 mmHg LVOT Vmax:         90.60 cm/s LVOT Vmean:        54.100 cm/s LVOT VTI:          0.185 m LVOT/AV VTI ratio: 0.63  AORTA Ao Root diam: 3.00 cm Ao Asc diam:  3.10 cm MITRAL VALVE MV Area (PHT): 3.17 cm     SHUNTS MV Area VTI:   2.00 cm     Systemic VTI:  0.18 m MV Peak grad:  6.7 mmHg     Systemic Diam: 1.80 cm MV Mean grad:  3.0 mmHg MV Vmax:       1.30 m/s MV Vmean:      83.2 cm/s MV Decel Time: 239 msec MV E velocity: 81.60 cm/s MV A velocity: 120.00 cm/s MV E/A ratio:  0.68 Arvilla Meres MD Electronically signed by Arvilla Meres MD Signature Date/Time: 03/14/2023/10:00:20 AM    Final    DG Chest Port 1 View Result Date: 03/13/2023 CLINICAL DATA:  Syncope. EXAM: PORTABLE CHEST 1 VIEW COMPARISON:  05/06/2020 FINDINGS: The heart is normal in size. Stable mediastinal contours. Aortic atherosclerosis. Chronically prominent hila likely due to enlarged central vascular structures. Low lung volumes with bronchovascular crowding. Subsegmental atelectasis or scar at the left lung base. No pleural fluid or pneumothorax. Right axillary surgical clips. IMPRESSION: Low lung volumes with bronchovascular crowding. Subsegmental atelectasis or scar at the left lung base. Electronically Signed   By: Narda Rutherford M.D.   On: 03/13/2023 20:36   CT Head Wo Contrast Result Date: 03/13/2023 CLINICAL DATA:  Mental status change EXAM: CT HEAD WITHOUT CONTRAST TECHNIQUE: Contiguous axial images were obtained from the base of the skull through the vertex without intravenous contrast. RADIATION DOSE REDUCTION: This exam was performed according to the departmental dose-optimization program which includes automated exposure control, adjustment of the mA and/or kV according to patient size and/or use of iterative  reconstruction technique. COMPARISON:  CT brain 01/23/2011 FINDINGS: Brain: No acute territorial infarction, hemorrhage or intracranial mass. Minimal white matter hypodensity. Nonenlarged ventricles. Vascular: No hyperdense vessels.  Carotid vascular calcification Skull:  Normal. Negative for fracture or focal lesion. Sinuses/Orbits: Fluid level left maxillary sinus Other: None IMPRESSION: 1. No CT evidence for acute intracranial abnormality. 2. Fluid level in the left maxillary sinus, correlate for acute sinusitis. Electronically Signed   By: Jasmine Pang M.D.   On: 03/13/2023 18:45    Scheduled Meds:  atorvastatin  20 mg Oral Daily   enoxaparin (LOVENOX) injection  40 mg Subcutaneous Q24H   famotidine  40 mg Oral Daily   influenza vaccine adjuvanted  0.5 mL Intramuscular Tomorrow-1000   insulin aspart  0-5 Units Subcutaneous QHS   insulin aspart  0-9 Units Subcutaneous TID WC   montelukast  10 mg Oral QHS   pantoprazole  40 mg Oral BID   pyridOXINE  100 mg Oral Daily   sodium chloride flush  3 mL Intravenous Q12H   umeclidinium bromide  1 puff Inhalation Daily   Continuous Infusions:   LOS: 0 days   Time spent: 40 minutes  Carollee Herter, DO  [facility]  03/14/2023, 1:41 PM

## 2023-03-14 NOTE — Assessment & Plan Note (Signed)
03-14-2023 family and pt states she has lost about 60 lbs on ozempic. I asked pt to start keeping BP log with entries before and after taking her HTN meds. I asked her to f/u with PCP to ensure she still needs HTN meds after 60 lbs weight loss.

## 2023-03-14 NOTE — Care Management Obs Status (Signed)
MEDICARE OBSERVATION STATUS NOTIFICATION   Patient Details  Name: ALAIAH MCMAINS MRN: [id number] Date of Birth: 1940/04/10   Medicare Observation Status Notification Given:  Yes    Lawerance Sabal, RN 03/14/2023, 8:16 AM

## 2023-03-14 NOTE — Assessment & Plan Note (Signed)
03-14-2023 stable. Continue statin.

## 2023-03-15 ENCOUNTER — Telehealth: Payer: Self-pay

## 2023-03-15 NOTE — Transitions of Care (Post Inpatient/ED Visit) (Signed)
03/15/2023  Name: Jennifer Wu MRN: [id number] DOB: 1940-04-25  Today's TOC FU Call Status: Today's TOC FU Call Status:: Successful TOC FU Call Completed TOC FU Call Complete Date: 03/15/23 Patient's Name and Date of Birth confirmed.  Transition Care Management Follow-up Telephone Call Date of Discharge: 03/14/23 Discharge Facility: [facility] Type of Discharge: Emergency Department Reason for ED Visit: Other: (dizzines) How have you been since you were released from the hospital?: Better Any questions or concerns?: No  Items Reviewed: Did you receive and understand the discharge instructions provided?: Yes Medications obtained,verified, and reconciled?: Yes (Medications Reviewed) Any new allergies since your discharge?: No Dietary orders reviewed?: Yes Do you have support at home?: Yes People in Home: child(ren), adult  Medications Reviewed Today: Medications Reviewed Today     Reviewed by Karena Addison, LPN (Licensed Practical Nurse) on 03/15/23 at 1453  Med List Status: <None>   Medication Order Taking? Sig Documenting Provider Last Dose Status Informant  amLODipine (NORVASC) 5 MG tablet [id number] No Take 1 tablet (5 mg total) by mouth daily. Donita Brooks, MD 03/13/2023 Morning Active Self, Pharmacy Records  atorvastatin (LIPITOR) 20 MG tablet [id number] No Take 1 tablet (20 mg total) by mouth daily. Donita Brooks, MD 03/13/2023 Morning Active Self, Pharmacy Records  cyclobenzaprine (FLEXERIL) 5 MG tablet [id number] No Take 1 tablet (5 mg total) by mouth 3 (three) times daily as needed for muscle spasms. Donita Brooks, MD Past Month Active Self, Pharmacy Records  famotidine (PEPCID) 40 MG tablet [id number] No Take 1 tablet (40 mg total) by mouth daily. Donita Brooks, MD 03/13/2023 Morning Active Self, Pharmacy Records  furosemide (LASIX) 20 MG tablet [id number] No Take 1 tablet (20 mg total) by mouth daily. Donita Brooks,  MD 03/13/2023 Morning Active Self, Pharmacy Records  Discontinued 12/25/19 0631   HYDROcodone bit-homatropine (HYCODAN) 5-1.5 MG/5ML syrup [id number] No Take 5 mLs by mouth every 8 (eight) hours as needed for cough. Park Meo, [place] 03/13/2023 Morning Active Self, Pharmacy Records           Med Note [facility] Alinda Dooms A   Tue Mar 13, 2023  4:22 PM) Pt took 2.75ml this morning.   metFORMIN (GLUCOPHAGE) 500 MG tablet [id number] No Take 1 tablet (500 mg total) by mouth 2 (two) times daily with a meal. Park Meo, FNP 03/13/2023 Morning Active Self, Pharmacy Records  montelukast (SINGULAIR) 10 MG tablet [id number] No Take 1 tablet (10 mg total) by mouth at bedtime. Donita Brooks, MD 03/12/2023 Bedtime Active Self, Pharmacy Records  pantoprazole (PROTONIX) 40 MG tablet [id number] No Take 1 tablet (40 mg total) by mouth 2 (two) times daily. Donita Brooks, MD 03/13/2023 Morning Active Self, Pharmacy Records  pyridOXINE (VITAMIN B6) 100 MG tablet [id number] No Take 1 tablet (100 mg total) by mouth daily. Donita Brooks, MD 03/13/2023 Morning Active Self, Pharmacy Records  Semaglutide, 1 MG/DOSE, (OZEMPIC, 1 MG/DOSE,) 4 MG/3ML SOPN [id number] No Inject 1 mg as directed once a week. Donita Brooks, MD 03/09/2023 Active Self, Pharmacy Records  Trospium Chloride 60 MG CP24 [id number] No Take 1 capsule (60 mg total) by mouth daily. Marguerita Beards, MD 03/13/2023 Morning Active Self, Pharmacy Records    Discontinued 09/01/20 0932 (Patient Preference)            Med Note>> Mackie Pai [facility]   09/01/2020  9:32 AM pt was having side effects & told to restart Amlodipine  VITAMIN D PO [id number] No Take 1 capsule by mouth daily. [provider] 03/13/2023 Morning Active Self, Pharmacy Records            Home Care and Equipment/Supplies: Were Home Health Services Ordered?: NA Any new equipment or medical supplies ordered?: NA  Functional Questionnaire: Do you need  assistance with bathing/showering or dressing?: No Do you need assistance with meal preparation?: No Do you need assistance with eating?: No Do you have difficulty maintaining continence: No Do you need assistance with getting out of bed/getting out of a chair/moving?: No Do you have difficulty managing or taking your medications?: No  Follow up appointments reviewed: PCP Follow-up appointment confirmed?: No (no avail appts. sent message to staff to schedule) Specialist Hospital Follow-up appointment confirmed?: NA Do you need transportation to your follow-up appointment?: No Do you understand care options if your condition(s) worsen?: Yes-patient verbalized understanding    SIGNATURE Karena Addison, LPN [facility] Nurse Health Advisor Direct Dial [phone]

## 2023-03-21 ENCOUNTER — Encounter: Payer: Self-pay | Admitting: Family Medicine

## 2023-03-23 ENCOUNTER — Other Ambulatory Visit: Payer: Self-pay

## 2023-03-27 ENCOUNTER — Inpatient Hospital Stay: Payer: Medicare PPO | Admitting: Family Medicine

## 2023-04-02 ENCOUNTER — Other Ambulatory Visit: Payer: Self-pay | Admitting: Family Medicine

## 2023-04-02 ENCOUNTER — Other Ambulatory Visit (HOSPITAL_COMMUNITY): Payer: Self-pay

## 2023-04-02 DIAGNOSIS — E118 Type 2 diabetes mellitus with unspecified complications: Secondary | ICD-10-CM

## 2023-04-02 DIAGNOSIS — E782 Mixed hyperlipidemia: Secondary | ICD-10-CM

## 2023-04-02 MED ORDER — METFORMIN HCL 500 MG PO TABS
500.0000 mg | ORAL_TABLET | Freq: Two times a day (BID) | ORAL | 1 refills | Status: DC
  Filled 2023-04-02: qty 180, 90d supply, fill #0

## 2023-04-05 ENCOUNTER — Other Ambulatory Visit (HOSPITAL_COMMUNITY): Payer: Self-pay

## 2023-04-05 ENCOUNTER — Encounter: Payer: Self-pay | Admitting: Family Medicine

## 2023-04-05 ENCOUNTER — Ambulatory Visit: Payer: Medicare PPO | Admitting: Family Medicine

## 2023-04-05 VITALS — BP 138/86 | HR 89 | Temp 97.7°F | Ht 65.0 in | Wt 173.0 lb

## 2023-04-05 DIAGNOSIS — Z23 Encounter for immunization: Secondary | ICD-10-CM

## 2023-04-05 DIAGNOSIS — R55 Syncope and collapse: Secondary | ICD-10-CM | POA: Diagnosis not present

## 2023-04-05 NOTE — Progress Notes (Signed)
 Subjective:    Patient ID: Jennifer Wu, female    DOB: April 14, 1940, 83 y.o.   MRN: [id number]  Admit date:     03/13/2023  Discharge date: 03/14/2023  Attending Physician: SHERLON BRAYTON RAMAN [[id number]]  Discharge Physician: Camellia Door    PCP: Duanne Butler ONEIDA, MD   Admitted From: Home Disposition:  Home   Recommendations for Outpatient Follow-up:  Follow up with PCP in 1-2 weeks Start keeping BP log to show your PCP at next office visit   Home Health:No Equipment/Devices: None   Discharge Condition:Stable CODE STATUS:FULL Diet recommendation: Heart Healthy Fluid Restriction: None   Hospital Summary: HPI: Jennifer Wu  is a 83 y.o. female, past medical history of hypertension, hyperlipidemia, type 2 diabetes mellitus, presents to ED secondary to syncope, patient presents, family at bedside at this with a history, patient was seen recently for URI symptoms, 02/27/2023, she is with known underlying chronic cough, but she has been having some congestion, it was in the [place], doing some laundry, where she had an episode of syncope, patient reports she does not think she lost consciousness totally, but second-order at the scene thinks she did lose consciousness for some minutes, with some fluttering eyes, not have facial asymmetry, patient does report some lightheadedness 20 minutes preceding the presyncope, denies any focal deficits, tingling, numbness, she reports she took all her meds this morning but she did not eat or drink much. -In ED her workup was significant for elevated creatinine at 1.1, low potassium at 2.9 (3.7) at discharge, white blood cell count of 10.5, her EKG with no acute findings, chest x-ray, CT head still pending, she was not orthostatic, [facility] hospitalist consulted to admit.   Significant Events: Admitted 03/13/2023 for syncope   Significant Labs: elevated creatinine at 1.1, low potassium at 2.9, white blood cell count of 10.5,    Significant Imaging  Studies: CT head shows No CT evidence for acute intracranial abnormality. CXR shows Low lung volumes with bronchovascular crowding. Subsegmental atelectasis or scar at the left lung base   04/05/23  Since I last saw the patient, she has lost considerable weight with Ozempic .  Her last A1c was 5.8.  She still taking metformin  and Ozempic .  She is also on amlodipine  and furosemide .  The patient has what sounds to be a vasovagal event when she was at the laundry.  She felt lightheaded.  She felt dizzy.  She tried to hold onto the counter.  However the longer she stood up more confused and lightheaded she became.  At that point EMS was called.  She denies any tachycardia or arrhythmias.  She denies any chest pain or shortness of breath.  Workup in the hospital was unremarkable except for a mild hypokalemia.  She has felt fine since discharge from the hospital.   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 [place];  Service: Gynecology;  Laterality: N/A;   DILATION AND CURETTAGE OF UTERUS  1976   MAB   ELBOW SURGERY Left 01/26/2011   No BP lt arm due to titanum rod   ELBOW SURGERY     FRACTURE SURGERY  rt foot-1990   HYSTEROSCOPY WITH D & C  06/09/2011    Procedure: DILATATION AND CURETTAGE /HYSTEROSCOPY;  Surgeon: Boas Horacio, MD;  Location: [facility];  Service: Gynecology;  Laterality: N/A;   KNEE SURGERY  10/12   right knee   LUMBAR LAMINECTOMY  1991   LYMPH NODE BIOPSY     right breast   RADIAL HEAD IMPLANT  02/01/2011   Procedure: RADIAL HEAD IMPLANT;  Surgeon: Donnice DELENA Robinsons, MD;  Location: [facility];  Service: Orthopedics;  Laterality: Left;  left radial head replacement   SVD     x 3   Current Outpatient Medications on File Prior to Visit  Medication Sig Dispense Refill   amLODipine  (NORVASC ) 5 MG tablet Take 1 tablet (5 mg total) by mouth daily. 90 tablet 3   atorvastatin  (LIPITOR) 20 MG tablet Take 1 tablet (20 mg total) by mouth daily. 90 tablet 3   cyclobenzaprine  (FLEXERIL ) 5 MG tablet Take 1 tablet (5 mg total) by mouth 3 (three) times daily as needed for muscle spasms. 90 tablet 1   famotidine  (PEPCID ) 40 MG tablet Take 1 tablet (40 mg total) by mouth daily. 90 tablet 3   furosemide  (LASIX ) 20 MG tablet Take 1 tablet (20 mg total) by mouth daily. 90 tablet 3   HYDROcodone  bit-homatropine (HYCODAN) 5-1.5 MG/5ML syrup Take 5 mLs by mouth every 8 (eight) hours as needed for cough. 120 mL 0   metFORMIN  (GLUCOPHAGE ) 500 MG tablet Take 1 tablet (500 mg total) by mouth 2 (two) times daily with a meal. 180 tablet 1   montelukast  (SINGULAIR ) 10 MG tablet Take 1 tablet (10 mg total) by mouth at bedtime. 90 tablet 3   pantoprazole  (PROTONIX ) 40 MG tablet Take 1 tablet (40 mg total) by mouth 2 (two) times daily. 180 tablet 3   pyridOXINE  (VITAMIN B6) 100 MG tablet Take 1 tablet (100 mg total) by mouth daily. 30 tablet 3   Semaglutide , 1 MG/DOSE, (OZEMPIC , 1 MG/DOSE,) 4 MG/3ML SOPN Inject 1 mg as directed once a week. 3 mL 3   Trospium  Chloride 60 MG CP24 Take 1 capsule (60 mg total) by mouth daily. 30 capsule 5   VITAMIN D PO Take 1 capsule by mouth daily.     [DISCONTINUED] hydrochlorothiazide  (HYDRODIURIL ) 25 MG  tablet TAKE 1 TABLET BY MOUTH DAILY. 90 tablet 1   [DISCONTINUED] valsartan  (DIOVAN ) 160 MG tablet Take 1 tablet (160 mg total) by mouth daily. Quit amlodipine  90 tablet 3   No current facility-administered medications on file prior to visit.   Allergies  Allergen Reactions   Lisinopril  Nausea And Vomiting   Social History   Socioeconomic History   Marital status: Married    Spouse name: Lupita   Number of children: 3   Years of education: Not on file   Highest education level: Not on file  Occupational History   Not on file  Tobacco Use   Smoking status: Never   Smokeless tobacco: Never  Vaping Use   Vaping status: Never Used  Substance and Sexual Activity   Alcohol use: No   Drug use: No   Sexual activity: Not Currently    Birth control/protection: Post-menopausal  Other Topics Concern   Not on file  Social History Narrative   3 daughters   1 currently under treatment for uterine cancer-2022.   13 grandchildren   11 great grandchildren   Social Drivers of Corporate Investment Banker Strain: Low Risk  (02/24/2022)   Overall Financial Resource Strain (CARDIA)    Difficulty of Paying Living Expenses: Not hard at all  Food Insecurity: No Food Insecurity (03/13/2023)   Hunger Vital Sign    Worried About Running Out of Food in the Last Year: Never true    Ran Out of Food in the Last Year: Never true  Transportation Needs: No Transportation Needs (03/13/2023)   PRAPARE - Administrator, Civil Service (Medical): No    Lack of Transportation (Non-Medical): No  Physical Activity: Insufficiently Active (02/24/2022)   Exercise Vital Sign    Days of Exercise per Week: 3 days    Minutes of Exercise per Session: 20 min  Stress: No Stress Concern Present (02/24/2022)   [employer] of Occupational Health - Occupational Stress Questionnaire    Feeling of Stress : Not at all  Social Connections: Socially Integrated (02/24/2022)   Social Connection and Isolation  Panel [NHANES]    Frequency of Communication with Friends and Family: More than three times a week    Frequency of Social Gatherings with Friends and Family: More than three times a week    Attends Religious Services: More than 4 times per year    Active Member of [employer] or Organizations: Yes    Attends Banker Meetings: More than 4 times per year    Marital Status: Married  Catering Manager Violence: Not At Risk (03/13/2023)   Humiliation, Afraid, Rape, and Kick questionnaire    Fear of Current or Ex-Partner: No    Emotionally Abused: No    Physically Abused: No    Sexually Abused: No      Review of Systems  All other systems reviewed and are negative.      Objective:   Physical Exam Vitals reviewed.  Constitutional:      General: She is not in acute distress.    Appearance: She is well-developed. She is not diaphoretic.  Neck:     Thyroid: No thyromegaly.     Vascular: No JVD.  Cardiovascular:     Rate and Rhythm: Normal rate and regular rhythm.     Heart sounds: Murmur heard.  Pulmonary:     Effort: Pulmonary effort is normal. No respiratory distress.     Breath sounds: Normal breath sounds. No wheezing or rales.  Abdominal:     General: Bowel sounds are normal. There is no distension.     Palpations: Abdomen is soft.     Tenderness: There is no abdominal tenderness. There is no rebound.          Assessment & Plan:  Need for pneumococcal vaccine - Plan: Pneumococcal Conjugate PCV21(Capvaxive)  Vasovagal syncope I believe the patient should have vasovagal syncope exacerbated by hypotension.  Given her weight loss, she no longer needs metformin .  She no longer needs amlodipine .  She no longer needs Lasix .  Discontinue these medications and monitor blood pressure.  She will notify me if her blood pressure rises above 140/90.  Recheck fasting lab work in May.

## 2023-04-19 ENCOUNTER — Other Ambulatory Visit: Payer: Self-pay

## 2023-04-19 ENCOUNTER — Other Ambulatory Visit (HOSPITAL_COMMUNITY): Payer: Self-pay

## 2023-04-20 DIAGNOSIS — Z1231 Encounter for screening mammogram for malignant neoplasm of breast: Secondary | ICD-10-CM | POA: Diagnosis not present

## 2023-04-20 LAB — HM MAMMOGRAPHY

## 2023-04-24 ENCOUNTER — Other Ambulatory Visit (HOSPITAL_COMMUNITY): Payer: Self-pay

## 2023-04-24 NOTE — Telephone Encounter (Signed)
Erroneous encounter. Please disregard.

## 2023-04-25 ENCOUNTER — Other Ambulatory Visit (HOSPITAL_COMMUNITY): Payer: Self-pay

## 2023-04-27 ENCOUNTER — Other Ambulatory Visit (HOSPITAL_COMMUNITY): Payer: Self-pay

## 2023-05-08 ENCOUNTER — Other Ambulatory Visit: Payer: Self-pay | Admitting: Family Medicine

## 2023-05-08 ENCOUNTER — Other Ambulatory Visit: Payer: Self-pay

## 2023-05-08 ENCOUNTER — Other Ambulatory Visit (HOSPITAL_COMMUNITY): Payer: Self-pay

## 2023-05-08 DIAGNOSIS — E118 Type 2 diabetes mellitus with unspecified complications: Secondary | ICD-10-CM

## 2023-05-08 MED ORDER — OZEMPIC (1 MG/DOSE) 4 MG/3ML ~~LOC~~ SOPN
1.0000 mg | PEN_INJECTOR | SUBCUTANEOUS | 3 refills | Status: DC
  Filled 2023-05-08: qty 3, 28d supply, fill #0
  Filled 2023-05-24 – 2023-05-29 (×2): qty 3, 28d supply, fill #1
  Filled 2023-07-09: qty 3, 28d supply, fill #2
  Filled 2023-08-13: qty 3, 28d supply, fill #3

## 2023-05-09 ENCOUNTER — Other Ambulatory Visit: Payer: Self-pay

## 2023-05-09 ENCOUNTER — Other Ambulatory Visit (HOSPITAL_COMMUNITY): Payer: Self-pay

## 2023-05-10 ENCOUNTER — Other Ambulatory Visit (HOSPITAL_COMMUNITY): Payer: Self-pay

## 2023-05-10 ENCOUNTER — Other Ambulatory Visit: Payer: Self-pay

## 2023-05-15 ENCOUNTER — Other Ambulatory Visit (HOSPITAL_COMMUNITY): Payer: Self-pay

## 2023-05-23 ENCOUNTER — Other Ambulatory Visit (HOSPITAL_COMMUNITY): Payer: Self-pay

## 2023-05-24 ENCOUNTER — Other Ambulatory Visit (HOSPITAL_COMMUNITY): Payer: Self-pay

## 2023-05-28 ENCOUNTER — Other Ambulatory Visit (HOSPITAL_COMMUNITY): Payer: Self-pay

## 2023-05-28 ENCOUNTER — Encounter: Payer: Self-pay | Admitting: Family Medicine

## 2023-05-28 ENCOUNTER — Ambulatory Visit: Payer: Medicare PPO | Admitting: Family Medicine

## 2023-05-28 VITALS — BP 136/86 | HR 89 | Temp 97.9°F | Ht 65.0 in | Wt 173.0 lb

## 2023-05-28 DIAGNOSIS — Z7985 Long-term (current) use of injectable non-insulin antidiabetic drugs: Secondary | ICD-10-CM

## 2023-05-28 DIAGNOSIS — E118 Type 2 diabetes mellitus with unspecified complications: Secondary | ICD-10-CM | POA: Diagnosis not present

## 2023-05-28 MED ORDER — AMLODIPINE BESYLATE 5 MG PO TABS
5.0000 mg | ORAL_TABLET | Freq: Every day | ORAL | 3 refills | Status: AC
  Filled 2023-05-28: qty 90, 90d supply, fill #0
  Filled 2023-09-03: qty 90, 90d supply, fill #1
  Filled 2023-12-12: qty 90, 90d supply, fill #2
  Filled 2024-03-08: qty 90, 90d supply, fill #0
  Filled 2024-03-08: qty 90, 90d supply, fill #3

## 2023-05-28 NOTE — Progress Notes (Signed)
 Subjective:    Patient ID: Jennifer Wu, female    DOB: 03/24/1941, 83 y.o.   MRN: [id number] Since her last visit, her systolic blood pressure has been averaging between 130 and 160.  I would estimate that the average blood pressure is 150/80.  She denies any chest pain shortness of breath or dyspnea on exertion. 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   Seasonal allergies    Vitamin D deficiency      Past Surgical History:  Procedure Laterality Date   BACK SURGERY     BREAST SURGERY     COLONOSCOPY     DILATATION & CURETTAGE/HYSTEROSCOPY WITH MYOSURE N/A 07/06/2022   Procedure: DILATATION & CURETTAGE/HYSTEROSCOPY WITH MYOSURE;  Surgeon: Lavina Hamman, MD;  Location: [facility];  Service: Gynecology;  Laterality: N/A;   DILATION AND CURETTAGE OF UTERUS  1976   MAB   ELBOW SURGERY Left 01/26/2011   No BP lt arm due to titanum rod   ELBOW SURGERY     FRACTURE SURGERY  rt foot-1990   HYSTEROSCOPY WITH D & C  06/09/2011   Procedure: DILATATION AND CURETTAGE /HYSTEROSCOPY;  Surgeon: Lavina Hamman, MD;  Location: [facility];  Service: Gynecology;  Laterality: N/A;   KNEE SURGERY  10/12   right knee   LUMBAR LAMINECTOMY  1991   LYMPH NODE BIOPSY     right breast   RADIAL HEAD IMPLANT  02/01/2011   Procedure: RADIAL HEAD IMPLANT;  Surgeon: Marlowe Shores, MD;  Location: [facility];  Service: Orthopedics;  Laterality: Left;  left radial head replacement   SVD     x 3   Current Outpatient Medications on File Prior to Visit  Medication Sig  Dispense Refill   atorvastatin (LIPITOR) 20 MG tablet Take 1 tablet (20 mg total) by mouth daily. 90 tablet 3   cyclobenzaprine (FLEXERIL) 5 MG tablet Take 1 tablet (5 mg total) by mouth 3 (three) times daily as needed for muscle spasms. 90 tablet 1   famotidine (PEPCID) 40 MG tablet Take 1 tablet (40 mg total) by mouth daily. 90 tablet 3   montelukast (SINGULAIR) 10 MG tablet Take 1 tablet (10 mg total) by mouth at bedtime. 90 tablet 3   pantoprazole (PROTONIX) 40 MG tablet Take 1 tablet (40 mg total) by mouth 2 (two) times daily. 180 tablet 3   pyridOXINE (VITAMIN B6) 100 MG tablet Take 1 tablet (100 mg total) by mouth daily. 30 tablet 3   Semaglutide, 1 MG/DOSE, (OZEMPIC, 1 MG/DOSE,) 4 MG/3ML SOPN Inject 1 mg as directed once a week. 3 mL 3   Trospium Chloride 60 MG CP24 Take 1 capsule (60 mg total) by mouth daily. 30 capsule 5   VITAMIN D PO Take 1 capsule by mouth daily.     [DISCONTINUED] hydrochlorothiazide (HYDRODIURIL) 25 MG tablet TAKE 1 TABLET BY MOUTH DAILY. 90 tablet 1   [DISCONTINUED] valsartan (DIOVAN) 160 MG tablet Take 1 tablet (160 mg total) by mouth daily. Quit amlodipine 90 tablet 3  No current facility-administered medications on file prior to visit.   Allergies  Allergen Reactions   Lisinopril Nausea And Vomiting   Social History   Socioeconomic History   Marital status: Married    Spouse name: Baldo Ash   Number of children: 3   Years of education: Not on file   Highest education level: Not on file  Occupational History   Not on file  Tobacco Use   Smoking status: Never   Smokeless tobacco: Never  Vaping Use   Vaping status: Never Used  Substance and Sexual Activity   Alcohol use: No   Drug use: No   Sexual activity: Not Currently    Birth control/protection: Post-menopausal  Other Topics Concern   Not on file  Social History Narrative   3 daughters   1 currently under treatment for uterine cancer-2022.   13 grandchildren   11 great grandchildren    Social Drivers of Corporate investment banker Strain: Low Risk  (02/24/2022)   Overall Financial Resource Strain (CARDIA)    Difficulty of Paying Living Expenses: Not hard at all  Food Insecurity: No Food Insecurity (03/13/2023)   Hunger Vital Sign    Worried About Running Out of Food in the Last Year: Never true    Ran Out of Food in the Last Year: Never true  Transportation Needs: No Transportation Needs (03/13/2023)   PRAPARE - Administrator, Civil Service (Medical): No    Lack of Transportation (Non-Medical): No  Physical Activity: Insufficiently Active (02/24/2022)   Exercise Vital Sign    Days of Exercise per Week: 3 days    Minutes of Exercise per Session: 20 min  Stress: No Stress Concern Present (02/24/2022)   [employer] of Occupational Health - Occupational Stress Questionnaire    Feeling of Stress : Not at all  Social Connections: Socially Integrated (02/24/2022)   Social Connection and Isolation Panel [NHANES]    Frequency of Communication with Friends and Family: More than three times a week    Frequency of Social Gatherings with Friends and Family: More than three times a week    Attends Religious Services: More than 4 times per year    Active Member of [employer] or Organizations: Yes    Attends Banker Meetings: More than 4 times per year    Marital Status: Married  Catering manager Violence: Not At Risk (03/13/2023)   Humiliation, Afraid, Rape, and Kick questionnaire    Fear of Current or Ex-Partner: No    Emotionally Abused: No    Physically Abused: No    Sexually Abused: No      Review of Systems  All other systems reviewed and are negative.      Objective:   Physical Exam Vitals reviewed.  Constitutional:      General: She is not in acute distress.    Appearance: She is well-developed. She is not diaphoretic.  Neck:     Thyroid: No thyromegaly.     Vascular: No JVD.  Cardiovascular:     Rate and Rhythm: Normal rate  and regular rhythm.     Heart sounds: Murmur heard.  Pulmonary:     Effort: Pulmonary effort is normal. No respiratory distress.     Breath sounds: Normal breath sounds. No wheezing or rales.  Abdominal:     General: Bowel sounds are normal. There is no distension.     Palpations: Abdomen is soft.     Tenderness: There is no abdominal tenderness.  There is no rebound.           Assessment & Plan:  Diabetes mellitus type 2 with complications (HCC) - Plan: Hemoglobin A1c, BASIC METABOLIC PANEL WITH GFR Resume amlodipine but 5 mg a day.  Check hemoglobin A1c.  Check CMP.  Goal hemoglobin A1c is less than 6.5.

## 2023-05-29 LAB — BASIC METABOLIC PANEL WITH GFR
BUN: 16 mg/dL (ref 7–25)
CO2: 32 mmol/L (ref 20–32)
Calcium: 10.5 mg/dL — ABNORMAL HIGH (ref 8.6–10.4)
Chloride: 100 mmol/L (ref 98–110)
Creat: 0.84 mg/dL (ref 0.60–0.95)
Glucose, Bld: 87 mg/dL (ref 65–99)
Potassium: 4.2 mmol/L (ref 3.5–5.3)
Sodium: 138 mmol/L (ref 135–146)
eGFR: 70 mL/min/{1.73_m2} (ref >=60)

## 2023-05-29 LAB — HEMOGLOBIN A1C
Hgb A1c MFr Bld: 5.6 %{Hb} (ref <5.7)
Mean Plasma Glucose: 114 mg/dL
eAG (mmol/L): 6.3 mmol/L

## 2023-06-04 ENCOUNTER — Other Ambulatory Visit: Payer: Self-pay | Admitting: Family Medicine

## 2023-06-04 NOTE — Telephone Encounter (Signed)
 Requested medication (s) are due for refill today: Yes  Requested medication (s) are on the active medication list: Yes  Last refill:  05/30/22  Future visit scheduled: Yes  Notes to clinic:  Unable to refill due to no refill protocol for this medication.      Requested Prescriptions  Pending Prescriptions Disp Refills   pyridOXINE (VITAMIN B6) 100 MG tablet 30 tablet 3    Sig: Take 1 tablet (100 mg total) by mouth daily.     Off-Protocol Failed - 06/04/2023  5:07 PM      Failed - Medication not assigned to a protocol, review manually.      Failed - Valid encounter within last 12 months    Recent Outpatient Visits           2 years ago Diabetes mellitus type 2 with complications (HCC)   [facility] Donita Brooks, MD   2 years ago Benign essential HTN   [facility] Pickard, Priscille Heidelberg, MD   3 years ago COVID-19   [facility] Tanya Nones, Priscille Heidelberg, MD   3 years ago Benign essential HTN   [facility] Tanya Nones Priscille Heidelberg, MD   5 years ago Benign essential HTN   [facility] Pickard, Priscille Heidelberg, MD

## 2023-06-04 NOTE — Telephone Encounter (Signed)
 Requested medication (s) are due for refill today: Yes  Requested medication (s) are on the active medication list: Yes  Last refill:  05/30/22  Future visit scheduled: Yes  Notes to clinic:  Unable to refill due to no refill protocol for this medication.      Requested Prescriptions  Pending Prescriptions Disp Refills   pyridOXINE (VITAMIN B6) 100 MG tablet 30 tablet 3    Sig: Take 1 tablet (100 mg total) by mouth daily.     Off-Protocol Failed - 06/04/2023  4:58 PM      Failed - Medication not assigned to a protocol, review manually.      Failed - Valid encounter within last 12 months    Recent Outpatient Visits           2 years ago Diabetes mellitus type 2 with complications (HCC)   [facility] Donita Brooks, MD   2 years ago Benign essential HTN   [facility] Pickard, Priscille Heidelberg, MD   3 years ago COVID-19   [facility] Tanya Nones, Priscille Heidelberg, MD   3 years ago Benign essential HTN   [facility] Tanya Nones Priscille Heidelberg, MD   5 years ago Benign essential HTN   [facility] Pickard, Priscille Heidelberg, MD

## 2023-06-07 ENCOUNTER — Other Ambulatory Visit (HOSPITAL_COMMUNITY): Payer: Self-pay

## 2023-06-07 ENCOUNTER — Encounter (HOSPITAL_COMMUNITY): Payer: Self-pay

## 2023-06-14 ENCOUNTER — Telehealth: Payer: Self-pay

## 2023-06-14 NOTE — Telephone Encounter (Signed)
 Copied from CRM [id number]. Topic: Clinical - Prescription Issue >> Jun 14, 2023  4:06 PM Ivette P wrote: Reason for CRM: Abby called in to see if we have received fax for pt for a continuous glucose monitor. Fax was sent 03/14 - checked media tab for pt did not see faxed. Verified fax number that they had on file. Was wrong, gave correct fax number. Papers will be re-faxed today 03/20 and will be for a physicians order and a copy of office notes from last pt visit.    Callback [phone]- ask for abby

## 2023-06-20 ENCOUNTER — Telehealth: Payer: Self-pay

## 2023-06-20 NOTE — Telephone Encounter (Signed)
 Copied from CRM [id number]. Topic: Clinical - Prescription Issue >> Jun 14, 2023  4:06 PM Ivette P wrote: Reason for CRM: Abby called in to see if we have received fax for pt for a continuous glucose monitor. Fax was sent 03/14 - checked media tab for pt did not see faxed. Verified fax number that they had on file. Was wrong, gave correct fax number. Papers will be re-faxed today 03/20 and will be for a physicians order and a copy of office notes from last pt visit.    Callback [phone]- ask for abby >> Jun 20, 2023 11:53 AM Izetta Dakin wrote: Judeth Cornfield with [employer] reaching out regarding the glucose monitor for patient. She states that document was faxed to office on 3/14/ and 3/20. Unable to locate document in patient's chart. She states that she will be refaxing the document as well as sending it via the parachute platform. Call back # [phone]

## 2023-06-26 ENCOUNTER — Telehealth: Payer: Self-pay

## 2023-06-26 NOTE — Telephone Encounter (Signed)
 Copied from CRM [id number]. Topic: General - Other >> Jun 26, 2023  2:02 PM [place] B wrote: Reason for CRM: abby from [employer] called in to requesting to see if she can obtain the patients summery of last office visit to make sure pt is qualified for any DME . ABBY can be reach at [phone] just ask for abby .

## 2023-07-09 ENCOUNTER — Other Ambulatory Visit: Payer: Self-pay

## 2023-07-09 ENCOUNTER — Other Ambulatory Visit (HOSPITAL_COMMUNITY): Payer: Self-pay

## 2023-07-11 ENCOUNTER — Other Ambulatory Visit (HOSPITAL_COMMUNITY): Payer: Self-pay

## 2023-08-06 ENCOUNTER — Ambulatory Visit: Payer: Medicare PPO | Admitting: Family Medicine

## 2023-08-13 ENCOUNTER — Other Ambulatory Visit: Payer: Self-pay | Admitting: Obstetrics and Gynecology

## 2023-08-13 ENCOUNTER — Other Ambulatory Visit (HOSPITAL_COMMUNITY): Payer: Self-pay

## 2023-08-13 DIAGNOSIS — N3281 Overactive bladder: Secondary | ICD-10-CM

## 2023-08-13 MED ORDER — TROSPIUM CHLORIDE ER 60 MG PO CP24
1.0000 | ORAL_CAPSULE | Freq: Every day | ORAL | 0 refills | Status: DC
  Filled 2023-08-13: qty 30, 30d supply, fill #0

## 2023-08-14 ENCOUNTER — Other Ambulatory Visit (HOSPITAL_COMMUNITY): Payer: Self-pay

## 2023-08-27 ENCOUNTER — Other Ambulatory Visit (HOSPITAL_COMMUNITY): Payer: Self-pay

## 2023-08-27 ENCOUNTER — Ambulatory Visit: Admitting: Obstetrics and Gynecology

## 2023-08-27 ENCOUNTER — Ambulatory Visit: Admitting: Family Medicine

## 2023-08-27 ENCOUNTER — Encounter: Payer: Self-pay | Admitting: Obstetrics and Gynecology

## 2023-08-27 VITALS — BP 126/74 | HR 89

## 2023-08-27 VITALS — BP 136/76 | HR 82 | Temp 97.6°F | Ht 65.0 in | Wt 176.4 lb

## 2023-08-27 DIAGNOSIS — N3941 Urge incontinence: Secondary | ICD-10-CM | POA: Diagnosis not present

## 2023-08-27 DIAGNOSIS — R350 Frequency of micturition: Secondary | ICD-10-CM | POA: Diagnosis not present

## 2023-08-27 DIAGNOSIS — Z7985 Long-term (current) use of injectable non-insulin antidiabetic drugs: Secondary | ICD-10-CM | POA: Diagnosis not present

## 2023-08-27 DIAGNOSIS — N3281 Overactive bladder: Secondary | ICD-10-CM

## 2023-08-27 DIAGNOSIS — E118 Type 2 diabetes mellitus with unspecified complications: Secondary | ICD-10-CM | POA: Diagnosis not present

## 2023-08-27 MED ORDER — VIBEGRON 75 MG PO TABS
75.0000 mg | ORAL_TABLET | Freq: Every day | ORAL | 3 refills | Status: DC
  Filled 2023-08-27 – 2023-09-07 (×2): qty 90, 90d supply, fill #0

## 2023-08-27 MED ORDER — TIRZEPATIDE 5 MG/0.5ML ~~LOC~~ SOAJ
5.0000 mg | SUBCUTANEOUS | 11 refills | Status: AC
  Filled 2023-08-27 – 2023-11-12 (×3): qty 2, 28d supply, fill #0
  Filled 2024-03-08: qty 2, 28d supply, fill #1

## 2023-08-27 NOTE — Progress Notes (Signed)
 Subjective:    Patient ID: Jennifer Wu, female    DOB: 1940/04/11, 83 y.o.   MRN: [id number]  Wt Readings from Last 3 Encounters:  08/27/23 176 lb 6.4 oz (80 kg)  05/28/23 173 lb (78.5 kg)  04/05/23 173 lb (78.5 kg)   Patient reports increasing reflux.  She states that at night, she will often feel reflux and acid in her throat.  She also occasionally wakes up strangling on stomach fluid.  She is unable to elevate the head of her bed.  She is taking pantoprazole  but she has not been taking Pepcid .  She is on semaglutide  1 mg subcu weekly for diabetes.  However she has been taking this for quite some time and she does not think that the semaglutide  is exacerbating her acid reflux.  However she has noticed weight gain again pounds her last visit.  Is interested in changing semaglutide  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   Seasonal allergies    Vitamin D deficiency      Past Surgical History:  Procedure Laterality Date   BACK SURGERY     BREAST SURGERY     COLONOSCOPY     DILATATION & CURETTAGE/HYSTEROSCOPY WITH MYOSURE N/A 07/06/2022   Procedure: DILATATION & CURETTAGE/HYSTEROSCOPY WITH MYOSURE;  Surgeon: Cyd Dowse, MD;  Location: [facility];  Service: Gynecology;  Laterality: N/A;   DILATION AND CURETTAGE OF UTERUS  1976   MAB   ELBOW SURGERY Left 01/26/2011   No BP lt arm due to titanum rod   ELBOW SURGERY     FRACTURE SURGERY  rt foot-1990   HYSTEROSCOPY WITH D & C  06/09/2011   Procedure: DILATATION AND CURETTAGE /HYSTEROSCOPY;  Surgeon: Cyd Dowse, MD;  Location: [facility];  Service: Gynecology;  Laterality: N/A;   KNEE SURGERY  10/12   right knee   LUMBAR LAMINECTOMY  1991   LYMPH NODE BIOPSY     right breast   RADIAL HEAD IMPLANT  02/01/2011   Procedure: RADIAL HEAD IMPLANT;  Surgeon: Hedy Living, MD;  Location: [facility];  Service: Orthopedics;  Laterality: Left;  left radial head replacement   SVD     x 3   Current Outpatient Medications on File Prior to Visit  Medication Sig Dispense Refill   amLODipine  (NORVASC ) 5 MG tablet Take 1 tablet (5 mg total) by mouth daily. 90 tablet 3   atorvastatin  (LIPITOR) 20 MG tablet Take 1 tablet (20 mg total) by mouth daily. 90 tablet 3   cyclobenzaprine  (FLEXERIL ) 5 MG tablet Take 1 tablet (5 mg total) by mouth 3 (three) times daily as needed for muscle spasms. 90 tablet 1   famotidine  (PEPCID ) 40 MG tablet Take 1 tablet (40 mg total) by mouth daily. 90 tablet 3   montelukast  (SINGULAIR ) 10 MG tablet Take 1 tablet (10 mg total) by mouth at bedtime. 90 tablet 3   pantoprazole  (PROTONIX ) 40 MG tablet Take 1 tablet (40 mg total) by mouth 2 (two) times daily. 180  tablet 3   pyridOXINE  (VITAMIN B6) 100 MG tablet Take 1 tablet (100 mg total) by mouth daily. 30 tablet 3   Semaglutide , 1 MG/DOSE, (OZEMPIC , 1 MG/DOSE,) 4 MG/3ML SOPN Inject 1 mg as directed once a week. 3 mL 3   Trospium  Chloride 60 MG CP24 Take 1 capsule (60 mg total) by mouth daily. 30 capsule 0   VITAMIN D PO Take 1 capsule by mouth daily.     [DISCONTINUED] hydrochlorothiazide  (HYDRODIURIL ) 25 MG tablet TAKE 1 TABLET BY MOUTH DAILY. 90 tablet 1   [DISCONTINUED] valsartan  (DIOVAN ) 160 MG tablet Take 1 tablet (160 mg total) by mouth daily. Quit amlodipine  90 tablet 3   No current facility-administered medications on file prior to visit.   Allergies  Allergen Reactions   Lisinopril  Nausea And Vomiting   Social History   Socioeconomic History   Marital status: Married    Spouse name: Gorman Laughter   Number  of children: 3   Years of education: Not on file   Highest education level: 12th grade  Occupational History   Not on file  Tobacco Use   Smoking status: Never   Smokeless tobacco: Never  Vaping Use   Vaping status: Never Used  Substance and Sexual Activity   Alcohol use: No   Drug use: No   Sexual activity: Not Currently    Birth control/protection: Post-menopausal  Other Topics Concern   Not on file  Social History Narrative   3 daughters   1 currently under treatment for uterine cancer-2022.   13 grandchildren   11 great grandchildren   Social Drivers of Corporate investment banker Strain: Low Risk  (08/27/2023)   Overall Financial Resource Strain (CARDIA)    Difficulty of Paying Living Expenses: Not hard at all  Food Insecurity: No Food Insecurity (08/27/2023)   Hunger Vital Sign    Worried About Running Out of Food in the Last Year: Never true    Ran Out of Food in the Last Year: Never true  Transportation Needs: No Transportation Needs (08/27/2023)   PRAPARE - Administrator, Civil Service (Medical): No    Lack of Transportation (Non-Medical): No  Physical Activity: Insufficiently Active (08/27/2023)   Exercise Vital Sign    Days of Exercise per Week: 5 days    Minutes of Exercise per Session: 20 min  Stress: No Stress Concern Present (08/27/2023)   [employer] of Occupational Health - Occupational Stress Questionnaire    Feeling of Stress : Not at all  Social Connections: Socially Integrated (08/27/2023)   Social Connection and Isolation Panel [NHANES]    Frequency of Communication with Friends and Family: More than three times a week    Frequency of Social Gatherings with Friends and Family: More than three times a week    Attends Religious Services: More than 4 times per year    Active Member of [employer] or Organizations: Yes    Attends Banker Meetings: More than 4 times per year    Marital Status: Married  Catering manager Violence: Not At  Risk (03/13/2023)   Humiliation, Afraid, Rape, and Kick questionnaire    Fear of Current or Ex-Partner: No    Emotionally Abused: No    Physically Abused: No    Sexually Abused: No      Review of Systems  All other systems reviewed and are negative.      Objective:   Physical Exam Vitals reviewed.  Constitutional:  General: She is not in acute distress.    Appearance: She is well-developed. She is not diaphoretic.  Neck:     Thyroid: No thyromegaly.     Vascular: No JVD.  Cardiovascular:     Rate and Rhythm: Normal rate and regular rhythm.     Heart sounds: Murmur heard.  Pulmonary:     Effort: Pulmonary effort is normal. No respiratory distress.     Breath sounds: Normal breath sounds. No wheezing or rales.  Abdominal:     General: Bowel sounds are normal. There is no distension.     Palpations: Abdomen is soft.     Tenderness: There is no abdominal tenderness. There is no rebound.           Assessment & Plan:  Diabetes mellitus type 2 with complications (HCC) - Plan: CBC with Differential/Platelet, Comprehensive metabolic panel with GFR, Lipid panel, Hemoglobin A1c, Microalbumin/Creatinine Ratio, Urine Blood pressure today is excellent.  Continue amlodipine  5 mg daily.  Check CBC CMP lipid panel and A1c.  Goal A1c is less than 6.5 and goal LDL cholesterol is less than 100.  Recommended the patient take her Pepcid  daily in addition to her pantoprazole .  We will switch Ozempic  to Mounjaro 5 mg subcu weekly and uptitrate as tolerated.  If acid reflux worsens, patient may need to consider discontinuation of GLP-1 therapy.  Also recommended that she elevate the head of her bed

## 2023-08-27 NOTE — Patient Instructions (Addendum)
 Add the Gemtesa 75mg  daily for your bladder control and continue the Trospium  60mg .   Please let us  know if this is not helpful.

## 2023-08-27 NOTE — Progress Notes (Signed)
 [facility] Return Visit  SUBJECTIVE  History of Present Illness: DENAI CABA is a 83 y.o. female seen in follow-up for OAB. Plan at last visit was continue Trospium  and consider tertiary therapy options such as PTNS, SNM, or Bladder botox.   Patient reports she would like to try something else oral before starting something more invasive.    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   Past Surgical History: She  has a past surgical history that includes Knee surgery (10/12); Breast surgery; Lymph node biopsy; Colonoscopy; Lumbar laminectomy (1991); Back surgery; Fracture surgery (rt foot-1990); Radial head implant (02/01/2011); Dilation and curettage of uterus (1976); SVD; Hysteroscopy with D & C (06/09/2011); Elbow surgery (Left, 01/26/2011); Elbow surgery; and Dilatation & curettage/hysteroscopy with myosure (N/A, 07/06/2022).   Medications: She has a current medication list which includes the following prescription(s): amlodipine , atorvastatin , cyclobenzaprine , famotidine , montelukast , pantoprazole , pyridoxine , tirzepatide, trospium  chloride, vibegron, vitamin d, [DISCONTINUED] hydrochlorothiazide , and [DISCONTINUED] valsartan .   Allergies: Patient is allergic to lisinopril .   Social History: Patient  reports that she has never smoked. She has never used smokeless tobacco. She reports that she does not drink alcohol and does not use drugs.     OBJECTIVE     Physical Exam: Vitals:   08/27/23 1305 08/27/23 1413  BP: (!) 164/88 126/74   Pulse: 93 89   Gen: No apparent distress, A&O x 3.  Detailed Urogynecologic Evaluation:  Deferred.   ASSESSMENT AND PLAN    Ms. Bucklin is a 83 y.o. with:  1. Overactive bladder   2. Urge incontinence   3. Urinary frequency    For patient's OAB we will try combined therapy of Trospium  60mg  ER daily and Gemtesa 75mg  daily for her OAB symptoms. Patient reports she is getting up every 2 hours at night and this is the most bothersome symptoms to try and work on.  Patient is considering PTNS for 3rd line therapy options but will be gone to the beach for the next few months.  Patient to try combined oral therapy and we will follow up after the summer to see if this has been effective or if she wants to do PTNS.   Patient to follow up in 3 months or sooner if needed.   Nalanie Winiecki G Cobie Marcoux, NP

## 2023-08-28 ENCOUNTER — Other Ambulatory Visit (HOSPITAL_COMMUNITY): Payer: Self-pay

## 2023-08-28 ENCOUNTER — Telehealth: Payer: Self-pay | Admitting: Pharmacy Technician

## 2023-08-28 ENCOUNTER — Ambulatory Visit: Payer: Self-pay | Admitting: Family Medicine

## 2023-08-28 NOTE — Telephone Encounter (Signed)
 Pharmacy Patient Advocate Encounter   Received notification from Onbase that prior authorization for Mounjaro 5MG /0.5ML auto-injectors is required/requested.   Insurance verification completed.   The patient is insured through [place] .   Per test claim: PA required; PA submitted to above mentioned insurance via CoverMyMeds Key/confirmation #/EOC [id number] Status is pending

## 2023-08-29 ENCOUNTER — Other Ambulatory Visit (HOSPITAL_COMMUNITY): Payer: Self-pay

## 2023-08-29 NOTE — Telephone Encounter (Signed)
 Pharmacy Patient Advocate Encounter  Received notification from [employer] that Prior Authorization for Mounjaro 5MG /0.5ML auto-injectors has been APPROVED from 03/28/23 to 03/26/24. Ran test claim, Copay is $40.00. This test claim was processed through [facility]- copay amounts may vary at other pharmacies due to pharmacy/plan contracts, or as the patient moves through the different stages of their insurance plan.   PA #/Case ID/Reference #: [id number]

## 2023-08-30 LAB — CBC WITH DIFFERENTIAL/PLATELET
Absolute Lymphocytes: 2294 {cells}/uL (ref 850–3900)
Absolute Monocytes: 422 {cells}/uL (ref 200–950)
Basophils Absolute: 37 {cells}/uL (ref 0–200)
Basophils Relative: 0.5 %
Eosinophils Absolute: 222 {cells}/uL (ref 15–500)
Eosinophils Relative: 3 %
HCT: 41.1 % (ref 35.0–45.0)
Hemoglobin: 13.4 g/dL (ref 11.7–15.5)
MCH: 27.3 pg (ref 27.0–33.0)
MCHC: 32.6 g/dL (ref 32.0–36.0)
MCV: 83.7 fL (ref 80.0–100.0)
MPV: 14.2 fL — ABNORMAL HIGH (ref 7.5–12.5)
Monocytes Relative: 5.7 %
Neutro Abs: 4425 {cells}/uL (ref 1500–7800)
Neutrophils Relative %: 59.8 %
Platelets: 236 10*3/uL (ref 140–400)
RBC: 4.91 10*6/uL (ref 3.80–5.10)
RDW: 14.5 % (ref 11.0–15.0)
Total Lymphocyte: 31 %
WBC: 7.4 10*3/uL (ref 3.8–10.8)

## 2023-08-30 LAB — COMPREHENSIVE METABOLIC PANEL WITH GFR
AG Ratio: 1.5 (calc) (ref 1.0–2.5)
ALT: 11 U/L (ref 6–29)
AST: 18 U/L (ref 10–35)
Albumin: 4.1 g/dL (ref 3.6–5.1)
Alkaline phosphatase (APISO): 73 U/L (ref 37–153)
BUN: 13 mg/dL (ref 7–25)
CO2: 26 mmol/L (ref 20–32)
Calcium: 10.5 mg/dL — ABNORMAL HIGH (ref 8.6–10.4)
Chloride: 102 mmol/L (ref 98–110)
Creat: 0.78 mg/dL (ref 0.60–0.95)
Globulin: 2.7 g/dL (ref 1.9–3.7)
Glucose, Bld: 85 mg/dL (ref 65–99)
Potassium: 3.6 mmol/L (ref 3.5–5.3)
Sodium: 139 mmol/L (ref 135–146)
Total Bilirubin: 0.4 mg/dL (ref 0.2–1.2)
Total Protein: 6.8 g/dL (ref 6.1–8.1)
eGFR: 76 mL/min/{1.73_m2} (ref >=60)

## 2023-08-30 LAB — MICROALBUMIN / CREATININE URINE RATIO
Creatinine, Urine: 146 mg/dL (ref 20–275)
Microalb Creat Ratio: 27 mg/g{creat} (ref <30)
Microalb, Ur: 3.9 mg/dL

## 2023-08-30 LAB — LIPID PANEL
Cholesterol: 145 mg/dL (ref <200)
HDL: 51 mg/dL (ref >=50)
LDL Cholesterol (Calc): 76 mg/dL
Non-HDL Cholesterol (Calc): 94 mg/dL (ref <130)
Total CHOL/HDL Ratio: 2.8 (calc) (ref <5.0)
Triglycerides: 97 mg/dL (ref <150)

## 2023-08-30 LAB — HEMOGLOBIN A1C
Hgb A1c MFr Bld: 5.6 % (ref <5.7)
Mean Plasma Glucose: 114 mg/dL
eAG (mmol/L): 6.3 mmol/L

## 2023-09-03 ENCOUNTER — Other Ambulatory Visit: Payer: Self-pay | Admitting: Obstetrics and Gynecology

## 2023-09-03 DIAGNOSIS — N3281 Overactive bladder: Secondary | ICD-10-CM

## 2023-09-04 ENCOUNTER — Other Ambulatory Visit (HOSPITAL_COMMUNITY): Payer: Self-pay

## 2023-09-04 ENCOUNTER — Other Ambulatory Visit: Payer: Self-pay

## 2023-09-04 MED ORDER — TROSPIUM CHLORIDE ER 60 MG PO CP24
1.0000 | ORAL_CAPSULE | Freq: Every day | ORAL | 0 refills | Status: DC
  Filled 2023-09-04 – 2023-11-12 (×4): qty 30, 30d supply, fill #0

## 2023-09-05 ENCOUNTER — Other Ambulatory Visit (HOSPITAL_COMMUNITY): Payer: Self-pay

## 2023-09-07 ENCOUNTER — Other Ambulatory Visit (HOSPITAL_COMMUNITY): Payer: Self-pay

## 2023-09-13 ENCOUNTER — Other Ambulatory Visit (HOSPITAL_COMMUNITY): Payer: Self-pay

## 2023-09-20 ENCOUNTER — Other Ambulatory Visit (HOSPITAL_COMMUNITY): Payer: Self-pay

## 2023-09-21 ENCOUNTER — Other Ambulatory Visit (HOSPITAL_COMMUNITY): Payer: Self-pay

## 2023-11-12 ENCOUNTER — Other Ambulatory Visit (HOSPITAL_COMMUNITY): Payer: Self-pay

## 2023-11-12 ENCOUNTER — Other Ambulatory Visit: Payer: Self-pay

## 2023-11-13 ENCOUNTER — Other Ambulatory Visit (HOSPITAL_COMMUNITY): Payer: Self-pay

## 2023-11-19 ENCOUNTER — Other Ambulatory Visit (HOSPITAL_COMMUNITY): Payer: Self-pay

## 2023-11-29 ENCOUNTER — Ambulatory Visit: Admitting: Obstetrics and Gynecology

## 2023-11-29 ENCOUNTER — Other Ambulatory Visit (HOSPITAL_COMMUNITY): Payer: Self-pay

## 2023-11-29 ENCOUNTER — Other Ambulatory Visit: Payer: Self-pay

## 2023-11-29 ENCOUNTER — Encounter: Payer: Self-pay | Admitting: Obstetrics and Gynecology

## 2023-11-29 VITALS — BP 119/76 | HR 91

## 2023-11-29 DIAGNOSIS — R350 Frequency of micturition: Secondary | ICD-10-CM | POA: Diagnosis not present

## 2023-11-29 DIAGNOSIS — N3281 Overactive bladder: Secondary | ICD-10-CM

## 2023-11-29 DIAGNOSIS — N3941 Urge incontinence: Secondary | ICD-10-CM

## 2023-11-29 MED ORDER — TROSPIUM CHLORIDE ER 60 MG PO CP24
1.0000 | ORAL_CAPSULE | Freq: Every day | ORAL | 3 refills | Status: AC
  Filled 2023-11-29 – 2023-12-12 (×2): qty 90, 90d supply, fill #0
  Filled 2024-03-08: qty 90, 90d supply, fill #1
  Filled 2024-03-08: qty 90, 90d supply, fill #0

## 2023-11-29 MED ORDER — VIBEGRON 75 MG PO TABS
75.0000 mg | ORAL_TABLET | Freq: Every day | ORAL | 3 refills | Status: AC
  Filled 2023-11-29 – 2024-03-08 (×3): qty 90, 90d supply, fill #0
  Filled 2024-03-08: qty 90, 90d supply, fill #1

## 2023-11-29 NOTE — Patient Instructions (Signed)
 Continue Trospium  60mg  ER daily and Gemtesa  75mg  combination therapy.   You can try doing the stimulation at the ankle (Go to the internal ankle bone, go up about 3 fingerlengths.

## 2023-11-29 NOTE — Progress Notes (Signed)
 [facility] Return Visit  SUBJECTIVE  History of Present Illness: SYNIYAH BOURNE is a 83 y.o. female seen in follow-up with her daughter for OAB. Plan at last visit was combination therapy of Trospium  60mg  ER daily and Gemtesa  75mg  daily.   Patient reports she has gone from a size 8 pad down to a 5 and she is not leaking nearly as much and has better sensation to go to the bathroom.   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   Past Surgical History: She  has a past surgical history that includes Knee surgery (10/12); Breast surgery; Lymph node biopsy; Colonoscopy; Lumbar laminectomy (1991); Back surgery; Fracture surgery (rt foot-1990); Radial head implant (02/01/2011); Dilation and curettage of uterus (1976); SVD; Hysteroscopy with D & C (06/09/2011); Elbow surgery (Left, 01/26/2011); Elbow surgery; and Dilatation & curettage/hysteroscopy with myosure (N/A, 07/06/2022).   Medications: She has a current medication list which includes the following prescription(s): amlodipine , atorvastatin , cyclobenzaprine , famotidine , montelukast , pantoprazole , pyridoxine , tirzepatide , trospium  chloride, vibegron , vitamin d, [DISCONTINUED] hydrochlorothiazide , and [DISCONTINUED] valsartan .   Allergies: Patient is allergic to lisinopril .   Social History: Patient  reports that she has never smoked. She has never used smokeless tobacco. She reports that she does not drink alcohol and does not use drugs.     OBJECTIVE     Physical Exam: Vitals:   11/29/23 1120   BP: 119/76  Pulse: 91   Gen: No apparent distress, A&O x 3.  Detailed Urogynecologic Evaluation:  Deferred.    ASSESSMENT AND PLAN    Ms. Kraft is a 83 y.o. with:  1. Overactive bladder   2. Urge incontinence    Patient to continue on Gemtesa  75mg  daily and Trospium  60mg  ER daily. She is improved with this regimen and does not wish to pursue SNM or Botox.  We discussed doing a PTNS style stimulation with a TENS unit at home for more support. Patient's daughter is medically savvy and we discussed how to do this and she will try and see if that is helpful for her mother.   Patient to return in 34months-1 year or sooner if needed.    Dominic Mahaney G Kaprice Kage, NP

## 2023-12-03 ENCOUNTER — Encounter: Payer: Self-pay | Admitting: Family Medicine

## 2023-12-03 ENCOUNTER — Ambulatory Visit: Admitting: Family Medicine

## 2023-12-03 ENCOUNTER — Other Ambulatory Visit (HOSPITAL_COMMUNITY): Payer: Self-pay

## 2023-12-03 VITALS — BP 118/62 | HR 76 | Temp 97.7°F | Ht 65.0 in | Wt 180.0 lb

## 2023-12-03 DIAGNOSIS — Z23 Encounter for immunization: Secondary | ICD-10-CM | POA: Diagnosis not present

## 2023-12-03 DIAGNOSIS — E118 Type 2 diabetes mellitus with unspecified complications: Secondary | ICD-10-CM | POA: Diagnosis not present

## 2023-12-03 MED ORDER — TIRZEPATIDE 7.5 MG/0.5ML ~~LOC~~ SOAJ
7.5000 mg | SUBCUTANEOUS | 3 refills | Status: AC
  Filled 2023-12-03 – 2023-12-13 (×2): qty 6, 84d supply, fill #0
  Filled 2023-12-14: qty 2, 28d supply, fill #0
  Filled 2024-01-10: qty 2, 28d supply, fill #1
  Filled 2024-02-16: qty 2, 28d supply, fill #2
  Filled 2024-03-31: qty 2, 28d supply, fill #3
  Filled 2024-05-02: qty 2, 28d supply, fill #4

## 2023-12-03 NOTE — Progress Notes (Signed)
 Wt Readings from Last 3 Encounters:  12/03/23 180 lb (81.6 kg)  08/27/23 176 lb 6.4 oz (80 kg)  05/28/23 173 lb (78.5 kg)     Subjective:    Patient ID: Jennifer Wu, female    DOB: Dec 29, 1940, 83 y.o.   MRN: [id number]  Wt Readings from Last 3 Encounters:  12/03/23 180 lb (81.6 kg)  08/27/23 176 lb 6.4 oz (80 kg)  05/28/23 173 lb (78.5 kg)   Patient states that she is tolerating Mounjaro  well.  However she has gained 4 pounds since switching from Ozempic  1 mg to Mounjaro  5 mg.  She denies any significant reflux.  She denies any abdominal pain.  Her blood pressure today is outstanding at 118/62.  Her last hemoglobin A1c in May was excellent at 5.6.  She is due for a flu shot. 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   Seasonal allergies    Vitamin D deficiency      Past Surgical History:  Procedure Laterality Date   BACK SURGERY     BREAST SURGERY     COLONOSCOPY     DILATATION & CURETTAGE/HYSTEROSCOPY WITH MYOSURE N/A 07/06/2022   Procedure: DILATATION & CURETTAGE/HYSTEROSCOPY WITH MYOSURE;  Surgeon: Horacio Boas, MD;  Location: [facility];  Service: Gynecology;  Laterality: N/A;   DILATION AND CURETTAGE OF UTERUS  1976   MAB   ELBOW SURGERY Left 01/26/2011   No BP lt arm due to titanum rod   ELBOW SURGERY     FRACTURE SURGERY  rt foot-1990   HYSTEROSCOPY WITH D & C  06/09/2011   Procedure: DILATATION AND CURETTAGE /HYSTEROSCOPY;  Surgeon: Boas Horacio, MD;  Location: [facility];  Service: Gynecology;  Laterality: N/A;   KNEE SURGERY  10/12   right  knee   LUMBAR LAMINECTOMY  1991   LYMPH NODE BIOPSY     right breast   RADIAL HEAD IMPLANT  02/01/2011   Procedure: RADIAL HEAD IMPLANT;  Surgeon: Donnice DELENA Robinsons, MD;  Location: [facility];  Service: Orthopedics;  Laterality: Left;  left radial head replacement   SVD     x 3   Current Outpatient Medications on File Prior to Visit  Medication Sig Dispense Refill   amLODipine  (NORVASC ) 5 MG tablet Take 1 tablet (5 mg total) by mouth daily. 90 tablet 3   atorvastatin  (LIPITOR) 20 MG tablet Take 1 tablet (20 mg total) by mouth daily. 90 tablet 3   cyclobenzaprine  (FLEXERIL ) 5 MG tablet Take 1 tablet (5 mg total) by mouth 3 (three) times daily as needed for muscle spasms. 90 tablet 1   famotidine  (PEPCID ) 40 MG tablet Take 1 tablet (40 mg total) by mouth daily. 90 tablet 3   montelukast  (SINGULAIR ) 10 MG tablet Take 1 tablet (10 mg total) by mouth at bedtime. 90 tablet 3   pantoprazole  (PROTONIX ) 40 MG tablet Take 1 tablet (40 mg total) by mouth 2 (two) times daily. 180 tablet 3   pyridOXINE  (VITAMIN B6) 100 MG tablet Take 1  tablet (100 mg total) by mouth daily. 30 tablet 3   tirzepatide  (MOUNJARO ) 5 MG/0.5ML Pen Inject 5 mg into the skin once a week. 2 mL 11   Trospium  Chloride 60 MG CP24 Take 1 capsule (60 mg total) by mouth daily. 90 capsule 3   Vibegron  75 MG TABS Take 1 tablet (75 mg total) by mouth daily. 90 tablet 3   VITAMIN D PO Take 1 capsule by mouth daily.     No current facility-administered medications on file prior to visit.   Allergies  Allergen Reactions   Lisinopril  Nausea And Vomiting   Social History   Socioeconomic History   Marital status: Married    Spouse name: Lupita   Number of children: 3   Years of education: Not on file   Highest education level: 12th grade  Occupational History   Not on file  Tobacco Use   Smoking status: Never   Smokeless tobacco: Never  Vaping Use   Vaping status: Never Used  Substance and Sexual Activity    Alcohol use: No   Drug use: No   Sexual activity: Not Currently    Birth control/protection: Post-menopausal  Other Topics Concern   Not on file  Social History Narrative   3 daughters   1 currently under treatment for uterine cancer-2022.   13 grandchildren   11 great grandchildren   Social Drivers of Corporate investment banker Strain: Low Risk  (08/27/2023)   Overall Financial Resource Strain (CARDIA)    Difficulty of Paying Living Expenses: Not hard at all  Food Insecurity: No Food Insecurity (08/27/2023)   Hunger Vital Sign    Worried About Running Out of Food in the Last Year: Never true    Ran Out of Food in the Last Year: Never true  Transportation Needs: No Transportation Needs (08/27/2023)   PRAPARE - Administrator, Civil Service (Medical): No    Lack of Transportation (Non-Medical): No  Physical Activity: Insufficiently Active (08/27/2023)   Exercise Vital Sign    Days of Exercise per Week: 5 days    Minutes of Exercise per Session: 20 min  Stress: No Stress Concern Present (08/27/2023)   [employer] of Occupational Health - Occupational Stress Questionnaire    Feeling of Stress : Not at all  Social Connections: Socially Integrated (08/27/2023)   Social Connection and Isolation Panel    Frequency of Communication with Friends and Family: More than three times a week    Frequency of Social Gatherings with Friends and Family: More than three times a week    Attends Religious Services: More than 4 times per year    Active Member of [employer] or Organizations: Yes    Attends Banker Meetings: More than 4 times per year    Marital Status: Married  Catering manager Violence: Not At Risk (03/13/2023)   Humiliation, Afraid, Rape, and Kick questionnaire    Fear of Current or Ex-Partner: No    Emotionally Abused: No    Physically Abused: No    Sexually Abused: No      Review of Systems  All other systems reviewed and are negative.      Objective:    Physical Exam Vitals reviewed.  Constitutional:      General: She is not in acute distress.    Appearance: She is well-developed. She is not diaphoretic.  Neck:     Thyroid: No thyromegaly.     Vascular: No JVD.  Cardiovascular:  Rate and Rhythm: Normal rate and regular rhythm.     Heart sounds: Murmur heard.  Pulmonary:     Effort: Pulmonary effort is normal. No respiratory distress.     Breath sounds: Normal breath sounds. No wheezing or rales.  Abdominal:     General: Bowel sounds are normal. There is no distension.     Palpations: Abdomen is soft.     Tenderness: There is no abdominal tenderness. There is no rebound.           Assessment & Plan:  Diabetes mellitus type 2 with complications (HCC) - Plan: Hemoglobin A1c, Comprehensive metabolic panel with GFR Blood pressure today is excellent.  Continue amlodipine  5 mg daily.  We will increase Mounjaro  to 7.5 mg subcu weekly.  The patient can contact me in 1 month if she wants to increase to 10 mg especially if she is tolerating the medication without any nausea or breakthrough reflux.  She received her flu shot today.  Patient had an echocardiogram in December 2024.  She had mild mitral stenosis, and mild aortic stenosis.  She is asymptomatic.

## 2023-12-03 NOTE — Addendum Note (Signed)
 Addended by: ANGELENA RONAL BRADLEY K on: 12/03/2023 11:58 AM   Modules accepted: Orders

## 2023-12-04 ENCOUNTER — Ambulatory Visit: Payer: Self-pay | Admitting: Family Medicine

## 2023-12-04 LAB — COMPREHENSIVE METABOLIC PANEL WITH GFR
AG Ratio: 1.6 (calc) (ref 1.0–2.5)
ALT: 14 U/L (ref 6–29)
AST: 17 U/L (ref 10–35)
Albumin: 4.2 g/dL (ref 3.6–5.1)
Alkaline phosphatase (APISO): 78 U/L (ref 37–153)
BUN: 19 mg/dL (ref 7–25)
CO2: 25 mmol/L (ref 20–32)
Calcium: 10.3 mg/dL (ref 8.6–10.4)
Chloride: 101 mmol/L (ref 98–110)
Creat: 0.79 mg/dL (ref 0.60–0.95)
Globulin: 2.7 g/dL (ref 1.9–3.7)
Glucose, Bld: 94 mg/dL (ref 65–99)
Potassium: 3.8 mmol/L (ref 3.5–5.3)
Sodium: 138 mmol/L (ref 135–146)
Total Bilirubin: 0.4 mg/dL (ref 0.2–1.2)
Total Protein: 6.9 g/dL (ref 6.1–8.1)
eGFR: 75 mL/min/1.73m2 (ref >=60)

## 2023-12-04 LAB — HEMOGLOBIN A1C
Hgb A1c MFr Bld: 5.6 % (ref <5.7)
Mean Plasma Glucose: 114 mg/dL
eAG (mmol/L): 6.3 mmol/L

## 2023-12-10 ENCOUNTER — Other Ambulatory Visit (HOSPITAL_COMMUNITY): Payer: Self-pay

## 2023-12-12 ENCOUNTER — Other Ambulatory Visit (HOSPITAL_COMMUNITY): Payer: Self-pay

## 2023-12-12 ENCOUNTER — Other Ambulatory Visit: Payer: Self-pay

## 2023-12-13 ENCOUNTER — Other Ambulatory Visit (HOSPITAL_COMMUNITY): Payer: Self-pay

## 2023-12-14 ENCOUNTER — Other Ambulatory Visit (HOSPITAL_COMMUNITY): Payer: Self-pay

## 2023-12-17 ENCOUNTER — Other Ambulatory Visit (HOSPITAL_COMMUNITY): Payer: Self-pay

## 2024-01-10 ENCOUNTER — Other Ambulatory Visit (HOSPITAL_COMMUNITY): Payer: Self-pay

## 2024-01-10 ENCOUNTER — Other Ambulatory Visit: Payer: Self-pay

## 2024-02-18 ENCOUNTER — Other Ambulatory Visit (HOSPITAL_COMMUNITY): Payer: Self-pay

## 2024-03-03 ENCOUNTER — Other Ambulatory Visit (HOSPITAL_COMMUNITY): Payer: Self-pay

## 2024-03-04 ENCOUNTER — Telehealth: Payer: Self-pay | Admitting: Pharmacy Technician

## 2024-03-04 ENCOUNTER — Other Ambulatory Visit (HOSPITAL_COMMUNITY): Payer: Self-pay

## 2024-03-04 NOTE — Telephone Encounter (Signed)
 Pharmacy Patient Advocate Encounter   Received notification from Onbase that prior authorization for Mounjaro  7.5MG /0.5ML auto-injectors is required/requested.   Insurance verification completed.   The patient is insured through [place].   Per test claim: PA required; PA started via CoverMyMeds. KEY [id number] . Waiting for clinical questions to populate.

## 2024-03-04 NOTE — Telephone Encounter (Signed)
 Pharmacy Patient Advocate Encounter  Received notification from [employer] that Prior Authorization for Mounjaro  7.5MG /0.5ML auto-injectors has been CANCELLED due to     PA #/Case ID/Reference #: [id number]

## 2024-03-08 ENCOUNTER — Other Ambulatory Visit (HOSPITAL_COMMUNITY): Payer: Self-pay

## 2024-03-08 ENCOUNTER — Other Ambulatory Visit: Payer: Self-pay | Admitting: Family Medicine

## 2024-03-08 DIAGNOSIS — E782 Mixed hyperlipidemia: Secondary | ICD-10-CM

## 2024-03-08 DIAGNOSIS — R053 Chronic cough: Secondary | ICD-10-CM

## 2024-03-08 DIAGNOSIS — J45991 Cough variant asthma: Secondary | ICD-10-CM

## 2024-03-08 DIAGNOSIS — J45901 Unspecified asthma with (acute) exacerbation: Secondary | ICD-10-CM

## 2024-03-09 ENCOUNTER — Other Ambulatory Visit (HOSPITAL_COMMUNITY): Payer: Self-pay

## 2024-03-10 ENCOUNTER — Other Ambulatory Visit (HOSPITAL_COMMUNITY): Payer: Self-pay

## 2024-03-10 ENCOUNTER — Other Ambulatory Visit: Payer: Self-pay

## 2024-03-10 DIAGNOSIS — R053 Chronic cough: Secondary | ICD-10-CM

## 2024-03-10 DIAGNOSIS — E782 Mixed hyperlipidemia: Secondary | ICD-10-CM

## 2024-03-10 DIAGNOSIS — J45901 Unspecified asthma with (acute) exacerbation: Secondary | ICD-10-CM

## 2024-03-10 DIAGNOSIS — J45991 Cough variant asthma: Secondary | ICD-10-CM

## 2024-03-10 MED ORDER — CYCLOBENZAPRINE HCL 5 MG PO TABS
5.0000 mg | ORAL_TABLET | Freq: Three times a day (TID) | ORAL | 1 refills | Status: AC | PRN
  Filled 2024-03-10: qty 90, 30d supply, fill #0

## 2024-03-10 MED ORDER — ATORVASTATIN CALCIUM 20 MG PO TABS
20.0000 mg | ORAL_TABLET | Freq: Every day | ORAL | 3 refills | Status: AC
  Filled 2024-03-10: qty 90, 90d supply, fill #0

## 2024-03-10 MED ORDER — FAMOTIDINE 40 MG PO TABS
40.0000 mg | ORAL_TABLET | Freq: Every day | ORAL | 3 refills | Status: AC
  Filled 2024-03-10: qty 90, 90d supply, fill #0

## 2024-03-10 MED ORDER — MONTELUKAST SODIUM 10 MG PO TABS
10.0000 mg | ORAL_TABLET | Freq: Every day | ORAL | 3 refills | Status: AC
  Filled 2024-03-10: qty 90, 90d supply, fill #0

## 2024-03-10 MED ORDER — PANTOPRAZOLE SODIUM 40 MG PO TBEC
40.0000 mg | DELAYED_RELEASE_TABLET | Freq: Two times a day (BID) | ORAL | 3 refills | Status: AC
  Filled 2024-03-10: qty 180, 90d supply, fill #0

## 2024-03-11 ENCOUNTER — Other Ambulatory Visit (HOSPITAL_COMMUNITY): Payer: Self-pay

## 2024-03-31 ENCOUNTER — Other Ambulatory Visit (HOSPITAL_COMMUNITY): Payer: Self-pay

## 2024-05-02 ENCOUNTER — Other Ambulatory Visit: Payer: Self-pay
